# Patient Record
Sex: Female | Born: 1945 | Race: White | Hispanic: No | Marital: Married | Smoking: Never smoker
Health system: Southern US, Community
[De-identification: ages and names within clinical notes are randomized; demographics above are authoritative.]

## PROBLEM LIST (undated history)

## (undated) DIAGNOSIS — Z87442 Personal history of urinary calculi: Secondary | ICD-10-CM

## (undated) DIAGNOSIS — I429 Cardiomyopathy, unspecified: Secondary | ICD-10-CM

## (undated) DIAGNOSIS — Z923 Personal history of irradiation: Secondary | ICD-10-CM

## (undated) DIAGNOSIS — R002 Palpitations: Secondary | ICD-10-CM

## (undated) DIAGNOSIS — C50919 Malignant neoplasm of unspecified site of unspecified female breast: Secondary | ICD-10-CM

## (undated) DIAGNOSIS — R519 Headache, unspecified: Secondary | ICD-10-CM

## (undated) DIAGNOSIS — T4145XA Adverse effect of unspecified anesthetic, initial encounter: Secondary | ICD-10-CM

## (undated) DIAGNOSIS — I10 Essential (primary) hypertension: Secondary | ICD-10-CM

## (undated) DIAGNOSIS — H9190 Unspecified hearing loss, unspecified ear: Secondary | ICD-10-CM

## (undated) DIAGNOSIS — E785 Hyperlipidemia, unspecified: Secondary | ICD-10-CM

## (undated) DIAGNOSIS — K219 Gastro-esophageal reflux disease without esophagitis: Secondary | ICD-10-CM

## (undated) DIAGNOSIS — D649 Anemia, unspecified: Secondary | ICD-10-CM

## (undated) DIAGNOSIS — T8859XA Other complications of anesthesia, initial encounter: Secondary | ICD-10-CM

## (undated) DIAGNOSIS — C801 Malignant (primary) neoplasm, unspecified: Secondary | ICD-10-CM

## (undated) HISTORY — PX: COLONOSCOPY: SHX174

## (undated) HISTORY — PX: WRIST SURGERY: SHX841

## (undated) HISTORY — PX: ABDOMINAL HYSTERECTOMY: SHX81

## (undated) HISTORY — PX: BREAST LUMPECTOMY: SHX2

## (undated) HISTORY — PX: INNER EAR SURGERY: SHX679

## (undated) HISTORY — PX: ROTATOR CUFF REPAIR: SHX139

---
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
## 2008-12-26 ENCOUNTER — Ambulatory Visit (HOSPITAL_BASED_OUTPATIENT_CLINIC_OR_DEPARTMENT_OTHER): Admission: RE | Admit: 2008-12-26 | Discharge: 2008-12-27 | Payer: Self-pay | Admitting: Orthopedic Surgery

## 2010-06-06 LAB — BASIC METABOLIC PANEL
BUN: 12 mg/dL (ref 6–23)
Chloride: 106 mEq/L (ref 96–112)
Glucose, Bld: 94 mg/dL (ref 70–99)
Potassium: 4.1 mEq/L (ref 3.5–5.1)
Sodium: 143 mEq/L (ref 135–145)

## 2010-06-06 LAB — POCT HEMOGLOBIN-HEMACUE: Hemoglobin: 13.7 g/dL (ref 12.0–15.0)

## 2010-06-12 ENCOUNTER — Encounter (HOSPITAL_COMMUNITY)
Admission: RE | Admit: 2010-06-12 | Discharge: 2010-06-12 | Disposition: A | Discharge status: Home / Self Care | Payer: Medicare Other | Source: Ambulatory Visit | Attending: Obstetrics and Gynecology | Admitting: Obstetrics and Gynecology

## 2010-06-12 LAB — CBC
HCT: 40.8 % (ref 36.0–46.0)
Hemoglobin: 13.4 g/dL (ref 12.0–15.0)
MCH: 30.9 pg (ref 26.0–34.0)
MCHC: 32.8 g/dL (ref 30.0–36.0)
RBC: 4.34 MIL/uL (ref 3.87–5.11)

## 2010-06-20 ENCOUNTER — Ambulatory Visit (HOSPITAL_COMMUNITY)
Admission: RE | Admit: 2010-06-20 | Discharge: 2010-06-21 | Disposition: A | Discharge status: Home / Self Care | Payer: Medicare Other | Source: Ambulatory Visit | Attending: Obstetrics and Gynecology | Admitting: Obstetrics and Gynecology

## 2010-06-20 DIAGNOSIS — Z01818 Encounter for other preprocedural examination: Secondary | ICD-10-CM | POA: Insufficient documentation

## 2010-06-20 DIAGNOSIS — N815 Vaginal enterocele: Secondary | ICD-10-CM | POA: Insufficient documentation

## 2010-06-20 DIAGNOSIS — N993 Prolapse of vaginal vault after hysterectomy: Secondary | ICD-10-CM | POA: Insufficient documentation

## 2010-06-20 LAB — TYPE AND SCREEN: Antibody Screen: NEGATIVE

## 2010-06-20 LAB — ABO/RH: ABO/RH(D): O POS

## 2010-06-21 LAB — CBC
MCV: 94 fL (ref 78.0–100.0)
Platelets: 162 10*3/uL (ref 150–400)
RBC: 3.31 MIL/uL — ABNORMAL LOW (ref 3.87–5.11)
RDW: 12.8 % (ref 11.5–15.5)
WBC: 9.7 10*3/uL (ref 4.0–10.5)

## 2010-06-25 NOTE — Op Note (Signed)
NAMEALSACE, DOWD            ACCOUNT NO.:  [id number]  MEDICAL RECORD NO.:  [id number]           PATIENT TYPE:  LOCATION:                                 FACILITY:  PHYSICIAN:  Zelphia Cairo, MD    DATE OF BIRTH:  05/03/1945  DATE OF PROCEDURE:  06/20/2010 DATE OF DISCHARGE:                              OPERATIVE REPORT   PREOPERATIVE DIAGNOSIS:  Vaginal wall prolapse.  PROCEDURE: 1. Enterocele repair. 2. Anterior repair. 3. Posterior repair. 4. Sacral spinous ligament suspension. 5. Cystoscopy.  SURGEON:  Zelphia Cairo, MD  ASSISTANTHenderson Cloud.  BLOOD LOSS:  Minimal.  COMPLICATIONS:  None.  ANESTHESIA:  General.  CONDITION:  Stable and extubated to recovery room.  PROCEDURE:  Sundee was taken to the operating room after informed consent was obtained.  She was given general anesthesia and placed in the dorsal lithotomy position using Allen stirrups.  She was prepped and draped in sterile fashion and a Foley catheter was inserted sterilely. A weighted speculum was placed in the posterior vaginal vault and the grade 3 enterocele was noted.  The enterocele sac was grasped with 2 Allis clamps and mucosa was injected with local anesthesia, horizontal superficial incision was made and the enterocele sac was entered sharply.  The bowel was sharply dissected free of the enterocele sac and surrounding vaginal cuff.  The patient was placed in Trendelenburg position and the pelvic peritoneum was reapproximated using a pursestring of 2-0 Vicryl suture.  Enterocele sac was then resected and our attention was turned to the anterior vaginal wall.  The anterior vaginal wall was injected with one-to-one solution of dilute lidocaine with epinephrine.  The suburethral vaginal mucosa was grasped with 2 Allis clamps and a vertical incision in the vaginal mucosa was made using a scalpel.  The cut edges of the mucosa was grasped on each side with Allis clamps and the  pubocervical fascia and bladder were dissected off of the vaginal mucosa.  The bladder fascia then was imbricated using 2-0 Vicryl in interrupted sutures.  This was a small amount of the vaginal mucosa was then resected and the vaginal mucosal edges were reapproximated using a running locked stitch of 0 Vicryl.  Now our attention was turned to the posterior vaginal wall 2 Allis clamps were used to grasp the posterior fourchette and V-shaped wedge of the perineum and posterior fourchette was dissected using the scalpel. The midline of the vaginal mucosa was grasped with 2 Allis clamps and an incision was made up the vaginal vault.  Allis clamps were used to grasp the incision and the underlying tissue was dissected off of the vaginal mucosa with a dry sponge.  The rectum was dissected free of the vaginal mucosa bluntly and the right sacral spinous ligament was identified. The ischial spine could be palpated and the Capio needle was used to place a suture 2 fingerbreadths medial to the spine in the sacral spinous ligament.  This suture was also fixed to the vaginal cuff.  The posterior fascia was then reapproximated and imbricated.  Excess vaginal mucosa was excised and the vaginal mucosa was then reapproximated using 0 Vicryl in  a running locked fashion.  All retractors and were then removed from the abdomen and a cystoscopy was performed.  Bilateral ureteral jets were noted.  No sutures or other abnormalities were noted within the bladder.  Once cystoscopy was completed, the vagina was packed with gauze and Estrace cream.  The patient was extubated and taken to the recovery room in stable condition.     Zelphia Cairo, MD     GA/MEDQ  D:  06/22/2010  T:  06/22/2010  Job:  [id number]  Electronically Signed by Zelphia Cairo MD on 06/25/2010 01:39:23 PM

## 2011-04-24 DIAGNOSIS — J029 Acute pharyngitis, unspecified: Secondary | ICD-10-CM | POA: Diagnosis not present

## 2011-07-15 DIAGNOSIS — E785 Hyperlipidemia, unspecified: Secondary | ICD-10-CM | POA: Diagnosis not present

## 2011-12-01 DIAGNOSIS — J329 Chronic sinusitis, unspecified: Secondary | ICD-10-CM | POA: Diagnosis not present

## 2011-12-30 DIAGNOSIS — Z23 Encounter for immunization: Secondary | ICD-10-CM | POA: Diagnosis not present

## 2012-01-14 DIAGNOSIS — E785 Hyperlipidemia, unspecified: Secondary | ICD-10-CM | POA: Diagnosis not present

## 2012-01-14 DIAGNOSIS — Z833 Family history of diabetes mellitus: Secondary | ICD-10-CM | POA: Diagnosis not present

## 2012-01-14 DIAGNOSIS — I428 Other cardiomyopathies: Secondary | ICD-10-CM | POA: Diagnosis not present

## 2012-01-14 DIAGNOSIS — Z8349 Family history of other endocrine, nutritional and metabolic diseases: Secondary | ICD-10-CM | POA: Diagnosis not present

## 2012-03-01 DIAGNOSIS — R29890 Loss of height: Secondary | ICD-10-CM | POA: Diagnosis not present

## 2012-03-01 DIAGNOSIS — Z1231 Encounter for screening mammogram for malignant neoplasm of breast: Secondary | ICD-10-CM | POA: Diagnosis not present

## 2012-03-01 DIAGNOSIS — Z13820 Encounter for screening for osteoporosis: Secondary | ICD-10-CM | POA: Diagnosis not present

## 2012-03-01 DIAGNOSIS — Z1212 Encounter for screening for malignant neoplasm of rectum: Secondary | ICD-10-CM | POA: Diagnosis not present

## 2012-03-01 DIAGNOSIS — Z124 Encounter for screening for malignant neoplasm of cervix: Secondary | ICD-10-CM | POA: Diagnosis not present

## 2012-03-16 DIAGNOSIS — H2510 Age-related nuclear cataract, unspecified eye: Secondary | ICD-10-CM | POA: Diagnosis not present

## 2012-03-16 DIAGNOSIS — H524 Presbyopia: Secondary | ICD-10-CM | POA: Diagnosis not present

## 2012-08-19 DIAGNOSIS — E785 Hyperlipidemia, unspecified: Secondary | ICD-10-CM | POA: Diagnosis not present

## 2012-08-19 DIAGNOSIS — I428 Other cardiomyopathies: Secondary | ICD-10-CM | POA: Diagnosis not present

## 2012-09-06 DIAGNOSIS — Z23 Encounter for immunization: Secondary | ICD-10-CM | POA: Diagnosis not present

## 2012-09-06 DIAGNOSIS — E7800 Pure hypercholesterolemia, unspecified: Secondary | ICD-10-CM | POA: Diagnosis not present

## 2012-10-28 DIAGNOSIS — M75100 Unspecified rotator cuff tear or rupture of unspecified shoulder, not specified as traumatic: Secondary | ICD-10-CM | POA: Diagnosis not present

## 2012-12-21 DIAGNOSIS — M9981 Other biomechanical lesions of cervical region: Secondary | ICD-10-CM | POA: Diagnosis not present

## 2012-12-21 DIAGNOSIS — M999 Biomechanical lesion, unspecified: Secondary | ICD-10-CM | POA: Diagnosis not present

## 2012-12-21 DIAGNOSIS — M5412 Radiculopathy, cervical region: Secondary | ICD-10-CM | POA: Diagnosis not present

## 2012-12-23 DIAGNOSIS — M9981 Other biomechanical lesions of cervical region: Secondary | ICD-10-CM | POA: Diagnosis not present

## 2012-12-23 DIAGNOSIS — M5412 Radiculopathy, cervical region: Secondary | ICD-10-CM | POA: Diagnosis not present

## 2012-12-23 DIAGNOSIS — M999 Biomechanical lesion, unspecified: Secondary | ICD-10-CM | POA: Diagnosis not present

## 2012-12-28 DIAGNOSIS — M5412 Radiculopathy, cervical region: Secondary | ICD-10-CM | POA: Diagnosis not present

## 2012-12-28 DIAGNOSIS — M999 Biomechanical lesion, unspecified: Secondary | ICD-10-CM | POA: Diagnosis not present

## 2012-12-28 DIAGNOSIS — M9981 Other biomechanical lesions of cervical region: Secondary | ICD-10-CM | POA: Diagnosis not present

## 2013-01-07 DIAGNOSIS — M75100 Unspecified rotator cuff tear or rupture of unspecified shoulder, not specified as traumatic: Secondary | ICD-10-CM | POA: Diagnosis not present

## 2013-01-07 DIAGNOSIS — Z23 Encounter for immunization: Secondary | ICD-10-CM | POA: Diagnosis not present

## 2013-01-10 DIAGNOSIS — E7800 Pure hypercholesterolemia, unspecified: Secondary | ICD-10-CM | POA: Diagnosis not present

## 2013-01-10 DIAGNOSIS — M75100 Unspecified rotator cuff tear or rupture of unspecified shoulder, not specified as traumatic: Secondary | ICD-10-CM | POA: Diagnosis not present

## 2013-01-10 DIAGNOSIS — Z006 Encounter for examination for normal comparison and control in clinical research program: Secondary | ICD-10-CM | POA: Diagnosis not present

## 2013-01-10 DIAGNOSIS — I10 Essential (primary) hypertension: Secondary | ICD-10-CM | POA: Diagnosis not present

## 2013-01-14 DIAGNOSIS — M75120 Complete rotator cuff tear or rupture of unspecified shoulder, not specified as traumatic: Secondary | ICD-10-CM | POA: Diagnosis not present

## 2013-02-15 DIAGNOSIS — E785 Hyperlipidemia, unspecified: Secondary | ICD-10-CM | POA: Diagnosis not present

## 2013-02-15 DIAGNOSIS — I428 Other cardiomyopathies: Secondary | ICD-10-CM | POA: Diagnosis not present

## 2013-03-02 DIAGNOSIS — Z01419 Encounter for gynecological examination (general) (routine) without abnormal findings: Secondary | ICD-10-CM | POA: Diagnosis not present

## 2013-03-02 DIAGNOSIS — Z1231 Encounter for screening mammogram for malignant neoplasm of breast: Secondary | ICD-10-CM | POA: Diagnosis not present

## 2013-06-13 DIAGNOSIS — H2510 Age-related nuclear cataract, unspecified eye: Secondary | ICD-10-CM | POA: Diagnosis not present

## 2013-06-13 DIAGNOSIS — H5210 Myopia, unspecified eye: Secondary | ICD-10-CM | POA: Diagnosis not present

## 2013-09-20 DIAGNOSIS — E785 Hyperlipidemia, unspecified: Secondary | ICD-10-CM | POA: Diagnosis not present

## 2013-11-10 DIAGNOSIS — J209 Acute bronchitis, unspecified: Secondary | ICD-10-CM | POA: Diagnosis not present

## 2013-12-21 DIAGNOSIS — Z23 Encounter for immunization: Secondary | ICD-10-CM | POA: Diagnosis not present

## 2014-03-21 DIAGNOSIS — Z124 Encounter for screening for malignant neoplasm of cervix: Secondary | ICD-10-CM | POA: Diagnosis not present

## 2014-03-21 DIAGNOSIS — Z1231 Encounter for screening mammogram for malignant neoplasm of breast: Secondary | ICD-10-CM | POA: Diagnosis not present

## 2014-03-21 DIAGNOSIS — R29890 Loss of height: Secondary | ICD-10-CM | POA: Diagnosis not present

## 2014-06-13 DIAGNOSIS — E785 Hyperlipidemia, unspecified: Secondary | ICD-10-CM | POA: Diagnosis not present

## 2014-06-19 DIAGNOSIS — H2513 Age-related nuclear cataract, bilateral: Secondary | ICD-10-CM | POA: Diagnosis not present

## 2014-06-19 DIAGNOSIS — H40013 Open angle with borderline findings, low risk, bilateral: Secondary | ICD-10-CM | POA: Diagnosis not present

## 2014-06-22 DIAGNOSIS — I10 Essential (primary) hypertension: Secondary | ICD-10-CM | POA: Diagnosis not present

## 2014-10-12 DIAGNOSIS — N951 Menopausal and female climacteric states: Secondary | ICD-10-CM | POA: Diagnosis not present

## 2014-11-20 DIAGNOSIS — N951 Menopausal and female climacteric states: Secondary | ICD-10-CM | POA: Diagnosis not present

## 2014-12-19 DIAGNOSIS — Z23 Encounter for immunization: Secondary | ICD-10-CM | POA: Diagnosis not present

## 2014-12-28 DIAGNOSIS — I10 Essential (primary) hypertension: Secondary | ICD-10-CM

## 2014-12-28 DIAGNOSIS — I429 Cardiomyopathy, unspecified: Secondary | ICD-10-CM | POA: Diagnosis not present

## 2014-12-28 DIAGNOSIS — E782 Mixed hyperlipidemia: Secondary | ICD-10-CM | POA: Diagnosis not present

## 2014-12-28 DIAGNOSIS — Z6825 Body mass index (BMI) 25.0-25.9, adult: Secondary | ICD-10-CM | POA: Diagnosis not present

## 2014-12-28 HISTORY — DX: Essential (primary) hypertension: I10

## 2015-03-26 DIAGNOSIS — Z6825 Body mass index (BMI) 25.0-25.9, adult: Secondary | ICD-10-CM | POA: Diagnosis not present

## 2015-03-26 DIAGNOSIS — Z01419 Encounter for gynecological examination (general) (routine) without abnormal findings: Secondary | ICD-10-CM | POA: Diagnosis not present

## 2015-03-26 DIAGNOSIS — Z1231 Encounter for screening mammogram for malignant neoplasm of breast: Secondary | ICD-10-CM | POA: Diagnosis not present

## 2015-07-26 DIAGNOSIS — H40013 Open angle with borderline findings, low risk, bilateral: Secondary | ICD-10-CM | POA: Diagnosis not present

## 2015-07-26 DIAGNOSIS — H52223 Regular astigmatism, bilateral: Secondary | ICD-10-CM | POA: Diagnosis not present

## 2015-07-26 DIAGNOSIS — H2513 Age-related nuclear cataract, bilateral: Secondary | ICD-10-CM | POA: Diagnosis not present

## 2015-07-26 DIAGNOSIS — H524 Presbyopia: Secondary | ICD-10-CM | POA: Diagnosis not present

## 2015-09-13 DIAGNOSIS — E782 Mixed hyperlipidemia: Secondary | ICD-10-CM | POA: Diagnosis not present

## 2015-09-13 DIAGNOSIS — I429 Cardiomyopathy, unspecified: Secondary | ICD-10-CM | POA: Diagnosis not present

## 2015-09-13 DIAGNOSIS — I10 Essential (primary) hypertension: Secondary | ICD-10-CM | POA: Diagnosis not present

## 2015-09-13 DIAGNOSIS — Z6825 Body mass index (BMI) 25.0-25.9, adult: Secondary | ICD-10-CM | POA: Diagnosis not present

## 2015-09-18 DIAGNOSIS — I10 Essential (primary) hypertension: Secondary | ICD-10-CM | POA: Diagnosis not present

## 2015-09-18 DIAGNOSIS — I429 Cardiomyopathy, unspecified: Secondary | ICD-10-CM | POA: Diagnosis not present

## 2015-09-18 DIAGNOSIS — E782 Mixed hyperlipidemia: Secondary | ICD-10-CM | POA: Diagnosis not present

## 2015-12-25 DIAGNOSIS — Z23 Encounter for immunization: Secondary | ICD-10-CM | POA: Diagnosis not present

## 2016-03-26 DIAGNOSIS — Z6825 Body mass index (BMI) 25.0-25.9, adult: Secondary | ICD-10-CM | POA: Diagnosis not present

## 2016-03-26 DIAGNOSIS — Z01419 Encounter for gynecological examination (general) (routine) without abnormal findings: Secondary | ICD-10-CM | POA: Diagnosis not present

## 2016-03-26 DIAGNOSIS — N958 Other specified menopausal and perimenopausal disorders: Secondary | ICD-10-CM | POA: Diagnosis not present

## 2016-03-26 DIAGNOSIS — Z1231 Encounter for screening mammogram for malignant neoplasm of breast: Secondary | ICD-10-CM | POA: Diagnosis not present

## 2016-04-15 DIAGNOSIS — I2510 Atherosclerotic heart disease of native coronary artery without angina pectoris: Secondary | ICD-10-CM | POA: Diagnosis not present

## 2016-04-15 DIAGNOSIS — I428 Other cardiomyopathies: Secondary | ICD-10-CM | POA: Diagnosis not present

## 2016-04-15 DIAGNOSIS — I10 Essential (primary) hypertension: Secondary | ICD-10-CM | POA: Diagnosis not present

## 2016-04-15 DIAGNOSIS — E782 Mixed hyperlipidemia: Secondary | ICD-10-CM | POA: Diagnosis not present

## 2016-04-15 DIAGNOSIS — Z6825 Body mass index (BMI) 25.0-25.9, adult: Secondary | ICD-10-CM | POA: Diagnosis not present

## 2016-07-17 DIAGNOSIS — I10 Essential (primary) hypertension: Secondary | ICD-10-CM | POA: Diagnosis not present

## 2016-07-17 DIAGNOSIS — I2510 Atherosclerotic heart disease of native coronary artery without angina pectoris: Secondary | ICD-10-CM | POA: Diagnosis not present

## 2016-07-17 DIAGNOSIS — E782 Mixed hyperlipidemia: Secondary | ICD-10-CM | POA: Diagnosis not present

## 2016-07-17 DIAGNOSIS — I428 Other cardiomyopathies: Secondary | ICD-10-CM | POA: Diagnosis not present

## 2016-08-19 DIAGNOSIS — L709 Acne, unspecified: Secondary | ICD-10-CM | POA: Diagnosis not present

## 2016-08-19 DIAGNOSIS — Z9181 History of falling: Secondary | ICD-10-CM | POA: Diagnosis not present

## 2016-08-19 DIAGNOSIS — Z1389 Encounter for screening for other disorder: Secondary | ICD-10-CM | POA: Diagnosis not present

## 2016-08-19 DIAGNOSIS — Z0000 Encounter for general adult medical examination without abnormal findings: Secondary | ICD-10-CM | POA: Diagnosis not present

## 2016-10-29 DIAGNOSIS — H40013 Open angle with borderline findings, low risk, bilateral: Secondary | ICD-10-CM | POA: Diagnosis not present

## 2016-10-29 DIAGNOSIS — H2513 Age-related nuclear cataract, bilateral: Secondary | ICD-10-CM | POA: Diagnosis not present

## 2016-12-22 ENCOUNTER — Other Ambulatory Visit: Payer: Self-pay

## 2016-12-22 DIAGNOSIS — I2510 Atherosclerotic heart disease of native coronary artery without angina pectoris: Secondary | ICD-10-CM

## 2016-12-22 DIAGNOSIS — E785 Hyperlipidemia, unspecified: Secondary | ICD-10-CM

## 2016-12-22 HISTORY — DX: Hyperlipidemia, unspecified: E78.5

## 2016-12-22 HISTORY — DX: Atherosclerotic heart disease of native coronary artery without angina pectoris: I25.10

## 2016-12-25 DIAGNOSIS — Z23 Encounter for immunization: Secondary | ICD-10-CM | POA: Diagnosis not present

## 2016-12-30 ENCOUNTER — Ambulatory Visit (INDEPENDENT_AMBULATORY_CARE_PROVIDER_SITE_OTHER): Payer: Medicare Other | Admitting: Cardiology

## 2016-12-30 ENCOUNTER — Encounter: Payer: Self-pay | Admitting: Cardiology

## 2016-12-30 VITALS — BP 128/80 | HR 92 | Ht 62.0 in | Wt 136.0 lb

## 2016-12-30 DIAGNOSIS — I2510 Atherosclerotic heart disease of native coronary artery without angina pectoris: Secondary | ICD-10-CM

## 2016-12-30 DIAGNOSIS — I429 Cardiomyopathy, unspecified: Secondary | ICD-10-CM | POA: Diagnosis not present

## 2016-12-30 DIAGNOSIS — I10 Essential (primary) hypertension: Secondary | ICD-10-CM | POA: Diagnosis not present

## 2016-12-30 DIAGNOSIS — E782 Mixed hyperlipidemia: Secondary | ICD-10-CM

## 2016-12-30 NOTE — Patient Instructions (Signed)
Medication Instructions:  Your physician recommends that you continue on your current medications as directed. Please refer to the Current Medication list given to you today.   Labwork: Your physician recommends that you have: BMP, CBC, TSH, liver func, and lipid  Testing/Procedures: None  Follow-Up: Your physician recommends that you schedule a follow-up appointment in: 9 months   Any Other Special Instructions Will Be Listed Below (If Applicable).     If you need a refill on your cardiac medications before your next appointment, please call your pharmacy.   [place], RN, BSN

## 2016-12-30 NOTE — Progress Notes (Signed)
Cardiology Office Note:    Date:  12/30/2016   ID:  Andrea [name], DOB 29-Sep-1945, MRN [id number]  PCP:  Andrea Hipps, MD  Cardiologist:  Andrea Lindau, MD   Referring MD: No ref. provider found    ASSESSMENT:    1. Coronary artery disease involving native coronary artery of native heart without angina pectoris   2. Cardiomyopathy, unspecified type ([place])   3. Essential hypertension   4. Mixed hyperlipidemia    PLAN:    In order of problems listed above:  1. Secondary prevention stressed to the patient. Importance of compliance with diet and medications stressed and she vocalized understanding. 2. Her blood pressure stable. Diet was discussed with dyslipidemia.She will have blood work including lipids. She is fasting this morning. 3. She will be seen in follow-up appointment in 9-10 months or earlier if she has any concerns.   Medication Adjustments/Labs and Tests Ordered: Current medicines are reviewed at length with the patient today.  Concerns regarding medicines are outlined above.  No orders of the defined types were placed in this encounter.  No orders of the defined types were placed in this encounter.    Chief Complaint  Patient presents with  . Follow-up    Routine follow up      History of Present Illness:    Andrea [name] is a 71 y.o. female . The patient has history of cardiomyopathy and essential hypertension. She takes care of activities of daily living without any problems. No chest pain orthopnea or PND. At the time of my evaluation she is alert awake oriented and in no distress.  I have Taken care of her in my previous practice and she is here to get her care transferred and to be seen by me here.  History reviewed. No pertinent past medical history.  History reviewed. No pertinent surgical history.  Current Medications: Current Meds  Medication Sig  . Ascorbic Acid (VITAMIN C) 1000 MG tablet Take 1,000 mg by mouth.  Marland Kitchen aspirin EC  81 MG tablet Take 81 mg by mouth.  . beta carotene 10000 UNIT capsule Take by mouth.  . Calcium Carb-Cholecalciferol (CALCIUM-VITAMIN D) 500-200 MG-UNIT tablet Take by mouth.  . carvedilol (COREG) 6.25 MG tablet TAKE 1/2 TABLET BY MOUTH TWICE DAILY  . DOCOSAHEXAENOIC ACID PO Take 1,000 mg by mouth 2 (two) times daily.   Marland Kitchen estradiol (VIVELLE-DOT) 0.025 MG/24HR biweekly  . Multiple Vitamins-Minerals (OCUVITE PO) Take by mouth.  Marland Kitchen omeprazole (PRILOSEC) 40 MG capsule Take 40 mg by mouth.  . Probiotic CAPS Take by mouth.  . vitamin B-12 (CYANOCOBALAMIN) 1000 MCG tablet Take by mouth.  . zinc gluconate 50 MG tablet Take 50 mg by mouth.     Allergies:   Cortisone   Social History   Social History  . Marital status: Divorced    Spouse name: N/A  . Number of children: N/A  . Years of education: N/A   Social History Main Topics  . Smoking status: Never Smoker  . Smokeless tobacco: Never Used  . Alcohol use No  . Drug use: No  . Sexual activity: Not Asked   Other Topics Concern  . None   Social History Narrative  . None     Family History: The patient's family history includes Valvular heart disease in her brother.  ROS:   Please see the history of present illness.    All other systems reviewed and are negative.  EKGs/Labs/Other Studies Reviewed:  The following studies were reviewed today: I discussed my findings with the patient at extensive length. Her vital signs are fine and her exercise protocol and exercise tolerance is good.   Recent Labs: No results found for requested labs within last 8760 hours.  Recent Lipid Panel No results found for: CHOL, TRIG, HDL, CHOLHDL, VLDL, LDLCALC, LDLDIRECT  Physical Exam:    VS:  BP 128/80   Pulse 92   Ht 5\' 2"  (1.575 m)   Wt 136 lb 0.6 oz (61.7 kg)   SpO2 97%   BMI 24.88 kg/m     Wt Readings from Last 3 Encounters:  12/30/16 136 lb 0.6 oz (61.7 kg)     GEN: Patient is in no acute distress HEENT: Normal NECK: No  JVD; No carotid bruits LYMPHATICS: No lymphadenopathy CARDIAC: Hear sounds regular, 2/6 systolic murmur at the apex. RESPIRATORY:  Clear to auscultation without rales, wheezing or rhonchi  ABDOMEN: Soft, non-tender, non-distended MUSCULOSKELETAL:  No edema; No deformity  SKIN: Warm and dry NEUROLOGIC:  Alert and oriented x 3 PSYCHIATRIC:  Normal affect   Signed, Andrea Lindau, MD  12/30/2016 9:10 AM    [place]

## 2016-12-30 NOTE — Addendum Note (Signed)
Addended by: Mattie Marlin on: 12/30/2016 09:16 AM   Modules accepted: Orders

## 2016-12-31 LAB — BASIC METABOLIC PANEL
BUN/Creatinine Ratio: 14 (ref 12–28)
BUN: 11 mg/dL (ref 8–27)
CO2: 28 mmol/L (ref 20–29)
Calcium: 9.7 mg/dL (ref 8.7–10.3)
Chloride: 100 mmol/L (ref 96–106)
Creatinine, Ser: 0.81 mg/dL (ref 0.57–1.00)
GFR calc Af Amer: 83 mL/min/{1.73_m2} (ref >59)
GFR calc non Af Amer: 72 mL/min/{1.73_m2} (ref >59)
GLUCOSE: 105 mg/dL — AB (ref 65–99)
POTASSIUM: 4.3 mmol/L (ref 3.5–5.2)
SODIUM: 142 mmol/L (ref 134–144)

## 2016-12-31 LAB — HEPATIC FUNCTION PANEL
ALBUMIN: 4.4 g/dL (ref 3.5–4.8)
ALT: 13 IU/L (ref 0–32)
AST: 19 IU/L (ref 0–40)
Alkaline Phosphatase: 59 IU/L (ref 39–117)
Bilirubin Total: 0.4 mg/dL (ref 0.0–1.2)
Bilirubin, Direct: 0.13 mg/dL (ref 0.00–0.40)
TOTAL PROTEIN: 7.4 g/dL (ref 6.0–8.5)

## 2016-12-31 LAB — LIPID PANEL
CHOL/HDL RATIO: 2.5 ratio (ref 0.0–4.4)
Cholesterol, Total: 241 mg/dL — ABNORMAL HIGH (ref 100–199)
HDL: 98 mg/dL (ref >39)
LDL CALC: 126 mg/dL — AB (ref 0–99)
TRIGLYCERIDES: 84 mg/dL (ref 0–149)
VLDL CHOLESTEROL CAL: 17 mg/dL (ref 5–40)

## 2016-12-31 LAB — CBC WITH DIFFERENTIAL/PLATELET
BASOS: 0 %
Basophils Absolute: 0 10*3/uL (ref 0.0–0.2)
EOS (ABSOLUTE): 0.1 10*3/uL (ref 0.0–0.4)
Eos: 1 %
Hematocrit: 44.1 % (ref 34.0–46.6)
Hemoglobin: 14.6 g/dL (ref 11.1–15.9)
IMMATURE GRANULOCYTES: 0 %
Immature Grans (Abs): 0 10*3/uL (ref 0.0–0.1)
Lymphocytes Absolute: 1.2 10*3/uL (ref 0.7–3.1)
Lymphs: 19 %
MCH: 31.1 pg (ref 26.6–33.0)
MCHC: 33.1 g/dL (ref 31.5–35.7)
MCV: 94 fL (ref 79–97)
MONOS ABS: 0.5 10*3/uL (ref 0.1–0.9)
Monocytes: 8 %
NEUTROS PCT: 72 %
Neutrophils Absolute: 4.6 10*3/uL (ref 1.4–7.0)
PLATELETS: 243 10*3/uL (ref 150–379)
RBC: 4.69 x10E6/uL (ref 3.77–5.28)
RDW: 13.4 % (ref 12.3–15.4)
WBC: 6.3 10*3/uL (ref 3.4–10.8)

## 2016-12-31 LAB — TSH: TSH: 0.779 u[IU]/mL (ref 0.450–4.500)

## 2017-01-01 ENCOUNTER — Telehealth: Payer: Self-pay | Admitting: Cardiology

## 2017-01-01 NOTE — Telephone Encounter (Signed)
Left voicemail for the patient to call the office again.

## 2017-01-01 NOTE — Telephone Encounter (Signed)
Returning call about labs

## 2017-01-06 ENCOUNTER — Telehealth: Payer: Self-pay

## 2017-01-06 NOTE — Telephone Encounter (Signed)
Pt returned phone call about lab results. Pt informed and verbalized understanding.

## 2017-03-11 ENCOUNTER — Other Ambulatory Visit: Payer: Self-pay

## 2017-03-11 MED ORDER — CARVEDILOL 6.25 MG PO TABS: 3.1250 mg | ORAL_TABLET | Freq: Two times a day (BID) | ORAL | 1 refills | Status: DC

## 2017-03-27 DIAGNOSIS — Z6825 Body mass index (BMI) 25.0-25.9, adult: Secondary | ICD-10-CM | POA: Diagnosis not present

## 2017-03-27 DIAGNOSIS — Z1231 Encounter for screening mammogram for malignant neoplasm of breast: Secondary | ICD-10-CM | POA: Diagnosis not present

## 2017-03-27 DIAGNOSIS — Z124 Encounter for screening for malignant neoplasm of cervix: Secondary | ICD-10-CM | POA: Diagnosis not present

## 2017-08-24 ENCOUNTER — Other Ambulatory Visit: Payer: Self-pay

## 2017-08-24 MED ORDER — CARVEDILOL 6.25 MG PO TABS: 3.1250 mg | ORAL_TABLET | Freq: Two times a day (BID) | ORAL | 0 refills | Status: DC

## 2017-08-28 ENCOUNTER — Other Ambulatory Visit: Payer: Self-pay | Admitting: Cardiology

## 2017-10-27 ENCOUNTER — Ambulatory Visit (INDEPENDENT_AMBULATORY_CARE_PROVIDER_SITE_OTHER): Payer: Medicare Other | Admitting: Cardiology

## 2017-10-27 ENCOUNTER — Encounter: Payer: Self-pay | Admitting: Cardiology

## 2017-10-27 VITALS — BP 122/68 | HR 104 | Ht 62.0 in | Wt 136.0 lb

## 2017-10-27 DIAGNOSIS — R0989 Other specified symptoms and signs involving the circulatory and respiratory systems: Secondary | ICD-10-CM | POA: Insufficient documentation

## 2017-10-27 DIAGNOSIS — R002 Palpitations: Secondary | ICD-10-CM

## 2017-10-27 DIAGNOSIS — E782 Mixed hyperlipidemia: Secondary | ICD-10-CM | POA: Diagnosis not present

## 2017-10-27 DIAGNOSIS — Z8679 Personal history of other diseases of the circulatory system: Secondary | ICD-10-CM | POA: Diagnosis not present

## 2017-10-27 DIAGNOSIS — I2510 Atherosclerotic heart disease of native coronary artery without angina pectoris: Secondary | ICD-10-CM

## 2017-10-27 DIAGNOSIS — I10 Essential (primary) hypertension: Secondary | ICD-10-CM | POA: Diagnosis not present

## 2017-10-27 HISTORY — DX: Personal history of other diseases of the circulatory system: Z86.79

## 2017-10-27 HISTORY — DX: Other specified symptoms and signs involving the circulatory and respiratory systems: R09.89

## 2017-10-27 LAB — HEPATIC FUNCTION PANEL
ALT: 15 IU/L (ref 0–32)
AST: 16 IU/L (ref 0–40)
Albumin: 4.3 g/dL (ref 3.5–4.8)
Alkaline Phosphatase: 61 IU/L (ref 39–117)
Bilirubin Total: 0.4 mg/dL (ref 0.0–1.2)
Bilirubin, Direct: 0.13 mg/dL (ref 0.00–0.40)
Total Protein: 7 g/dL (ref 6.0–8.5)

## 2017-10-27 LAB — CBC WITH DIFFERENTIAL/PLATELET
BASOS ABS: 0 10*3/uL (ref 0.0–0.2)
Basos: 0 %
EOS (ABSOLUTE): 0.1 10*3/uL (ref 0.0–0.4)
Eos: 3 %
Hematocrit: 40.7 % (ref 34.0–46.6)
Hemoglobin: 14.1 g/dL (ref 11.1–15.9)
Immature Grans (Abs): 0 10*3/uL (ref 0.0–0.1)
Immature Granulocytes: 0 %
LYMPHS ABS: 1.3 10*3/uL (ref 0.7–3.1)
Lymphs: 25 %
MCH: 32 pg (ref 26.6–33.0)
MCHC: 34.6 g/dL (ref 31.5–35.7)
MCV: 92 fL (ref 79–97)
MONOCYTES: 8 %
MONOS ABS: 0.4 10*3/uL (ref 0.1–0.9)
Neutrophils Absolute: 3.3 10*3/uL (ref 1.4–7.0)
Neutrophils: 64 %
PLATELETS: 255 10*3/uL (ref 150–450)
RBC: 4.41 x10E6/uL (ref 3.77–5.28)
RDW: 13.5 % (ref 12.3–15.4)
WBC: 5.2 10*3/uL (ref 3.4–10.8)

## 2017-10-27 LAB — BASIC METABOLIC PANEL
BUN/Creatinine Ratio: 17 (ref 12–28)
BUN: 14 mg/dL (ref 8–27)
CHLORIDE: 100 mmol/L (ref 96–106)
CO2: 26 mmol/L (ref 20–29)
Calcium: 10.1 mg/dL (ref 8.7–10.3)
Creatinine, Ser: 0.81 mg/dL (ref 0.57–1.00)
GFR calc Af Amer: 83 mL/min/{1.73_m2} (ref >59)
GFR calc non Af Amer: 72 mL/min/{1.73_m2} (ref >59)
GLUCOSE: 104 mg/dL — AB (ref 65–99)
Potassium: 4.7 mmol/L (ref 3.5–5.2)
SODIUM: 142 mmol/L (ref 134–144)

## 2017-10-27 LAB — LIPID PANEL
CHOLESTEROL TOTAL: 250 mg/dL — AB (ref 100–199)
Chol/HDL Ratio: 2.5 ratio (ref 0.0–4.4)
HDL: 101 mg/dL (ref >39)
LDL CALC: 131 mg/dL — AB (ref 0–99)
TRIGLYCERIDES: 88 mg/dL (ref 0–149)
VLDL CHOLESTEROL CAL: 18 mg/dL (ref 5–40)

## 2017-10-27 LAB — TSH: TSH: 0.733 u[IU]/mL (ref 0.450–4.500)

## 2017-10-27 NOTE — Addendum Note (Signed)
Addended by: Mattie Marlin on: 10/27/2017 08:57 AM   Modules accepted: Orders

## 2017-10-27 NOTE — Progress Notes (Signed)
Cardiology Office Note:    Date:  10/27/2017   ID:  SUMAIYAH MARKERT, DOB 12-29-1945, MRN [id number]  PCP:  Ronita Hipps, MD  Cardiologist:  Jenean Lindau, MD   Referring MD: Ronita Hipps, MD    ASSESSMENT:    1. Essential hypertension   2. Mixed hyperlipidemia   3. History of cardiomyopathy    PLAN:    In order of problems listed above:  1. Secondary prevention stressed with the patient.  Importance of compliance with diet and medication stressed and she vocalized understanding.  Her blood pressure is stable.  She has a very good exercise program.  Her blood pressure is stable.  We will 2. We will check her lipids today along with the blood work 3. Echocardiogram will be done to assess murmur heard on auscultation 4. She will have bilateral carotid evaluation for bruit 5. We will do 48-hour Holter monitoring to assess palpitations. 6. Patient will be seen in follow-up appointment in 6 months or earlier if the patient has any concerns    Medication Adjustments/Labs and Tests Ordered: Current medicines are reviewed at length with the patient today.  Concerns regarding medicines are outlined above.  No orders of the defined types were placed in this encounter.  No orders of the defined types were placed in this encounter.    No chief complaint on file.    History of Present Illness:    ELLSIE VIOLETTE is a 72 y.o. female.  Patient has past medical history of cardiomyopathy.  She denies any problems at this time and takes care of activities of daily living.  No chest pain orthopnea or PND.  She mentions to me that she walks on a regular basis.  She feels her heartbeat is faster at times and may be palpitations.  No orthopnea or PND.  At the time of my evaluation, the patient is alert awake oriented and in no distress.  History reviewed. No pertinent past medical history.  Past Surgical History:  Procedure Laterality Date  . ABDOMINAL HYSTERECTOMY    . INNER  EAR SURGERY    . WRIST SURGERY      Current Medications: Current Meds  Medication Sig  . Ascorbic Acid (VITAMIN C) 1000 MG tablet Take 1,000 mg by mouth daily.   Marland Kitchen aspirin EC 81 MG tablet Take 81 mg by mouth daily.   . beta carotene 10000 UNIT capsule Take 10,000 Units by mouth daily.   . Calcium Carb-Cholecalciferol (CALCIUM-VITAMIN D) 500-200 MG-UNIT tablet Take 1 tablet by mouth daily.   . carvedilol (COREG) 6.25 MG tablet TAKE 1/2 TABLET BY MOUTH BY MOUTH TWICE DAILY  . DOCOSAHEXAENOIC ACID PO Take 1,000 mg by mouth 2 (two) times daily.   Marland Kitchen estradiol (VIVELLE-DOT) 0.025 MG/24HR Place 1 patch onto the skin once a week.   . Multiple Vitamins-Minerals (OCUVITE PO) Take 1 tablet by mouth daily.   Marland Kitchen omeprazole (PRILOSEC) 40 MG capsule Take 40 mg by mouth daily.   . Probiotic CAPS Take 1 capsule by mouth daily.   . vitamin B-12 (CYANOCOBALAMIN) 1000 MCG tablet Take 1,000 mcg by mouth daily.   Marland Kitchen zinc gluconate 50 MG tablet Take 50 mg by mouth daily.      Allergies:   Cortisone   Social History   Socioeconomic History  . Marital status: Divorced    Spouse name: Not on file  . Number of children: Not on file  . Years of education: Not on file  .  Highest education level: Not on file  Occupational History  . Not on file  Social Needs  . Financial resource strain: Not on file  . Food insecurity:    Worry: Not on file    Inability: Not on file  . Transportation needs:    Medical: Not on file    Non-medical: Not on file  Tobacco Use  . Smoking status: Never Smoker  . Smokeless tobacco: Never Used  Substance and Sexual Activity  . Alcohol use: No  . Drug use: No  . Sexual activity: Not on file  Lifestyle  . Physical activity:    Days per week: Not on file    Minutes per session: Not on file  . Stress: Not on file  Relationships  . Social connections:    Talks on phone: Not on file    Gets together: Not on file    Attends religious service: Not on file    Active member  of club or organization: Not on file    Attends meetings of clubs or organizations: Not on file    Relationship status: Not on file  Other Topics Concern  . Not on file  Social History Narrative  . Not on file     Family History: The patient's family history includes Valvular heart disease in her brother.  ROS:   Please see the history of present illness.    All other systems reviewed and are negative.  EKGs/Labs/Other Studies Reviewed:    The following studies were reviewed today: I discussed my findings with the patient at extensive length   Recent Labs: 12/30/2016: ALT 13; BUN 11; Creatinine, Ser 0.81; Hemoglobin 14.6; Platelets 243; Potassium 4.3; Sodium 142; TSH 0.779  Recent Lipid Panel    Component Value Date/Time   CHOL 241 (H) 12/30/2016 0921   TRIG 84 12/30/2016 0921   HDL 98 12/30/2016 0921   CHOLHDL 2.5 12/30/2016 0921   LDLCALC 126 (H) 12/30/2016 0921    Physical Exam:    VS:  BP 122/68 (BP Location: Right Arm, Patient Position: Sitting, Cuff Size: Normal)   Pulse (!) 104   Ht 5\' 2"  (1.575 m)   Wt 136 lb (61.7 kg)   SpO2 97%   BMI 24.87 kg/m     Wt Readings from Last 3 Encounters:  10/27/17 136 lb (61.7 kg)  12/30/16 136 lb 0.6 oz (61.7 kg)     GEN: Patient is in no acute distress HEENT: Normal NECK: No JVD; bilateral soft carotid bruits LYMPHATICS: No lymphadenopathy CARDIAC: Hear sounds regular, 2/6 systolic murmur at the apex. RESPIRATORY:  Clear to auscultation without rales, wheezing or rhonchi  ABDOMEN: Soft, non-tender, non-distended MUSCULOSKELETAL:  No edema; No deformity  SKIN: Warm and dry NEUROLOGIC:  Alert and oriented x 3 PSYCHIATRIC:  Normal affect   Signed, Jenean Lindau, MD  10/27/2017 8:45 AM    [place]

## 2017-10-27 NOTE — Patient Instructions (Signed)
Medication Instructions:  Your physician recommends that you continue on your current medications as directed. Please refer to the Current Medication list given to you today.  Labwork: Your physician recommends that you have the following labs drawn: BMP, CBC, TSH, liver and lipid panel.  Testing/Procedures: Your physician has requested that you have an echocardiogram. Echocardiography is a painless test that uses sound waves to create images of your heart. It provides your doctor with information about the size and shape of your heart and how well your heart's chambers and valves are working. This procedure takes approximately one hour. There are no restrictions for this procedure.  Your physician has recommended that you wear a holter monitor. Holter monitors are medical devices that record the heart's electrical activity. Doctors most often use these monitors to diagnose arrhythmias. Arrhythmias are problems with the speed or rhythm of the heartbeat. The monitor is a small, portable device. You can wear one while you do your normal daily activities. This is usually used to diagnose what is causing palpitations/syncope (passing out).  Your physician has requested that you have a carotid duplex. This test is an ultrasound of the carotid arteries in your neck. It looks at blood flow through these arteries that supply the brain with blood. Allow one hour for this exam. There are no restrictions or special instructions.  Follow-Up: Your physician recommends that you schedule a follow-up appointment in: 6 months  Any Other Special Instructions Will Be Listed Below (If Applicable).     If you need a refill on your cardiac medications before your next appointment, please call your pharmacy.   [place], RN, BSN

## 2017-10-29 ENCOUNTER — Telehealth: Payer: Self-pay | Admitting: Cardiology

## 2017-10-29 NOTE — Telephone Encounter (Signed)
Left message for pt to return call if desired regarding results.

## 2017-10-29 NOTE — Progress Notes (Signed)
Left message for pt and results sent to PCP.

## 2017-10-29 NOTE — Telephone Encounter (Signed)
Someone called her about her labs but she couldn't understand them

## 2017-11-04 DIAGNOSIS — H40013 Open angle with borderline findings, low risk, bilateral: Secondary | ICD-10-CM | POA: Diagnosis not present

## 2017-11-04 DIAGNOSIS — H2513 Age-related nuclear cataract, bilateral: Secondary | ICD-10-CM | POA: Diagnosis not present

## 2017-11-04 DIAGNOSIS — H5213 Myopia, bilateral: Secondary | ICD-10-CM | POA: Diagnosis not present

## 2017-11-04 DIAGNOSIS — H52223 Regular astigmatism, bilateral: Secondary | ICD-10-CM | POA: Diagnosis not present

## 2017-12-16 ENCOUNTER — Ambulatory Visit (INDEPENDENT_AMBULATORY_CARE_PROVIDER_SITE_OTHER): Payer: Medicare Other

## 2017-12-16 ENCOUNTER — Other Ambulatory Visit: Payer: Self-pay

## 2017-12-16 ENCOUNTER — Telehealth: Payer: Self-pay

## 2017-12-16 DIAGNOSIS — Z8679 Personal history of other diseases of the circulatory system: Secondary | ICD-10-CM | POA: Diagnosis not present

## 2017-12-16 DIAGNOSIS — R002 Palpitations: Secondary | ICD-10-CM

## 2017-12-16 DIAGNOSIS — I428 Other cardiomyopathies: Secondary | ICD-10-CM

## 2017-12-16 DIAGNOSIS — R0989 Other specified symptoms and signs involving the circulatory and respiratory systems: Secondary | ICD-10-CM | POA: Diagnosis not present

## 2017-12-16 NOTE — Telephone Encounter (Signed)
-----   Message from Richardo Priest, MD sent at 12/16/2017  1:33 PM EDT ----- Normal  result

## 2017-12-16 NOTE — Telephone Encounter (Signed)
Called patient and left detailed voice message on phone regarding test results. 

## 2017-12-16 NOTE — Progress Notes (Signed)
Complete echocardiogram has been performed.  Jimmy Celinda Dethlefs RDCS, RVT 

## 2017-12-16 NOTE — Progress Notes (Signed)
Carotid artery duplex exam has been performed.   Jimmy Analei Whinery RDCS, RVT

## 2017-12-29 DIAGNOSIS — Z23 Encounter for immunization: Secondary | ICD-10-CM | POA: Diagnosis not present

## 2018-04-27 ENCOUNTER — Ambulatory Visit (INDEPENDENT_AMBULATORY_CARE_PROVIDER_SITE_OTHER): Payer: Medicare Other | Admitting: Cardiology

## 2018-04-27 ENCOUNTER — Other Ambulatory Visit: Payer: Self-pay | Admitting: Cardiology

## 2018-04-27 ENCOUNTER — Encounter: Payer: Self-pay | Admitting: Cardiology

## 2018-04-27 VITALS — BP 150/80 | HR 84 | Ht 62.0 in | Wt 137.8 lb

## 2018-04-27 DIAGNOSIS — Z8679 Personal history of other diseases of the circulatory system: Secondary | ICD-10-CM

## 2018-04-27 DIAGNOSIS — I10 Essential (primary) hypertension: Secondary | ICD-10-CM

## 2018-04-27 DIAGNOSIS — I2510 Atherosclerotic heart disease of native coronary artery without angina pectoris: Secondary | ICD-10-CM | POA: Diagnosis not present

## 2018-04-27 DIAGNOSIS — E782 Mixed hyperlipidemia: Secondary | ICD-10-CM | POA: Diagnosis not present

## 2018-04-27 LAB — BASIC METABOLIC PANEL
BUN / CREAT RATIO: 16 (ref 12–28)
BUN: 14 mg/dL (ref 8–27)
CHLORIDE: 100 mmol/L (ref 96–106)
CO2: 27 mmol/L (ref 20–29)
CREATININE: 0.87 mg/dL (ref 0.57–1.00)
Calcium: 9.7 mg/dL (ref 8.7–10.3)
GFR calc Af Amer: 76 mL/min/{1.73_m2} (ref >59)
GFR calc non Af Amer: 66 mL/min/{1.73_m2} (ref >59)
Glucose: 100 mg/dL — ABNORMAL HIGH (ref 65–99)
POTASSIUM: 4.6 mmol/L (ref 3.5–5.2)
SODIUM: 140 mmol/L (ref 134–144)

## 2018-04-27 LAB — CBC
Hematocrit: 43 % (ref 34.0–46.6)
Hemoglobin: 13.9 g/dL (ref 11.1–15.9)
MCH: 30.5 pg (ref 26.6–33.0)
MCHC: 32.3 g/dL (ref 31.5–35.7)
MCV: 95 fL (ref 79–97)
PLATELETS: 251 10*3/uL (ref 150–450)
RBC: 4.55 x10E6/uL (ref 3.77–5.28)
RDW: 12.8 % (ref 11.7–15.4)
WBC: 3.9 10*3/uL (ref 3.4–10.8)

## 2018-04-27 LAB — HEPATIC FUNCTION PANEL
ALT: 17 IU/L (ref 0–32)
AST: 21 IU/L (ref 0–40)
Albumin: 4.3 g/dL (ref 3.7–4.7)
Alkaline Phosphatase: 61 IU/L (ref 39–117)
Bilirubin Total: 0.4 mg/dL (ref 0.0–1.2)
Bilirubin, Direct: 0.13 mg/dL (ref 0.00–0.40)
Total Protein: 7.1 g/dL (ref 6.0–8.5)

## 2018-04-27 LAB — TSH: TSH: 0.872 u[IU]/mL (ref 0.450–4.500)

## 2018-04-27 LAB — LIPID PANEL
CHOLESTEROL TOTAL: 260 mg/dL — AB (ref 100–199)
Chol/HDL Ratio: 2.5 ratio (ref 0.0–4.4)
HDL: 104 mg/dL (ref >39)
LDL CALC: 139 mg/dL — AB (ref 0–99)
Triglycerides: 86 mg/dL (ref 0–149)
VLDL Cholesterol Cal: 17 mg/dL (ref 5–40)

## 2018-04-27 NOTE — Patient Instructions (Signed)
Medication Instructions:   Your physician recommends that you continue on your current medications as directed. Please refer to the Current Medication list given to you today.   If you need a refill on your cardiac medications before your next appointment, please call your pharmacy.   Lab work:  Your physician recommends that you return for lab work today: BMP, CBC, TSH, LIPID, LFT.   If you have labs (blood work) drawn today and your tests are completely normal, you will receive your results only by: Marland Kitchen MyChart Message (if you have MyChart) OR . A paper copy in the mail If you have any lab test that is abnormal or we need to change your treatment, we will call you to review the results.  Testing/Procedures:  NONE  Follow-Up: At [facility], you and your health needs are our priority.  As part of our continuing mission to provide you with exceptional heart care, we have created designated Provider Care Teams.  These Care Teams include your primary Cardiologist (physician) and Advanced Practice Providers (APPs -  Physician Assistants and Nurse Practitioners) who all work together to provide you with the care you need, when you need it.   You will need a follow up appointment in 6 months.  Please call our office 2 months in advance to schedule this appointment.

## 2018-04-27 NOTE — Progress Notes (Signed)
Cardiology Office Note:    Date:  04/27/2018   ID:  Andrea [name], DOB 07/16/1945, MRN [id number]  PCP:  Ronita Hipps, MD  Cardiologist:  Jenean Lindau, MD   Referring MD: Ronita Hipps, MD    ASSESSMENT:    1. 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 Essential hypertension   3. Mixed hyperlipidemia   4. History of cardiomyopathy    PLAN:    In order of problems listed above:  1. Secondary prevention stressed with the patient.  Importance of compliance with diet and medication stressed and she vocalized understanding.  Her blood pressure is stable.  Diet was discussed for dyslipidemia.  Importance of exercise stressed and she has a good program at this time. 2. She is fasting and will have blood work today including fasting lipids 3. Patient will be seen in follow-up appointment in 6 months or earlier if the patient has any concerns    Medication Adjustments/Labs and Tests Ordered: Current medicines are reviewed at length with the patient today.  Concerns regarding medicines are outlined above.  No orders of the defined types were placed in this encounter.  No orders of the defined types were placed in this encounter.    Chief Complaint  Patient presents with  . Follow-up     History of Present Illness:    Andrea [name] is a 73 y.o. female.  Patient has remote history of nonobstructive coronary artery disease and cardiomyopathy with reversal to normal left ventricular systolic function.  She has history of essential hypertension.  She denies any problems at this time care of activities of daily living.  She lives a very active lifestyle.  No chest pain orthopnea PND.  She is here for routine follow-up.  At the time of my evaluation, the patient is alert awake oriented and in no distress.  No past medical history on file.  Past Surgical History:  Procedure  Laterality Date  . ABDOMINAL HYSTERECTOMY    . INNER EAR SURGERY    . WRIST SURGERY      Current Medications: Current Meds  Medication Sig  . Ascorbic Acid (VITAMIN C) 1000 MG tablet Take 1,000 mg by mouth daily.   Marland Kitchen aspirin EC 81 MG tablet Take 81 mg by mouth daily.   . beta carotene 10000 UNIT capsule Take 10,000 Units by mouth daily.   . Calcium Carb-Cholecalciferol (CALCIUM-VITAMIN D) 500-200 MG-UNIT tablet Take 1 tablet by mouth daily.   . carvedilol (COREG) 6.25 MG tablet TAKE 1/2 TABLET BY MOUTH BY MOUTH TWICE DAILY  . DOCOSAHEXAENOIC ACID PO Take 1,000 mg by mouth 2 (two) times daily.   Marland Kitchen estradiol (VIVELLE-DOT) 0.025 MG/24HR Place 1 patch onto the skin once a week.   . Multiple Vitamins-Minerals (OCUVITE PO) Take 1 tablet by mouth daily.   Marland Kitchen omeprazole (PRILOSEC) 40 MG capsule Take 40 mg by mouth daily.   . Probiotic CAPS Take 1 capsule by mouth daily.   . vitamin B-12 (CYANOCOBALAMIN) 1000 MCG tablet Take 1,000 mcg by mouth daily.   Marland Kitchen zinc gluconate 50 MG tablet Take 50 mg by mouth daily.      Allergies:   Cortisone   Social History   Socioeconomic History  . Marital status: Divorced    Spouse name: Not on file  . Number of children: Not on file  . Years of education: Not on file  . Highest education  level: Not on file  Occupational History  . Not on file  Social Needs  . Financial resource strain: Not on file  . Food insecurity:    Worry: Not on file    Inability: Not on file  . Transportation needs:    Medical: Not on file    Non-medical: Not on file  Tobacco Use  . Smoking status: Never Smoker  . Smokeless tobacco: Never Used  Substance and Sexual Activity  . Alcohol use: No  . Drug use: No  . Sexual activity: Not on file  Lifestyle  . Physical activity:    Days per week: Not on file    Minutes per session: Not on file  . Stress: Not on file  Relationships  . Social connections:    Talks on phone: Not on file    Gets together: Not on file     Attends religious service: Not on file    Active member of club or organization: Not on file    Attends meetings of clubs or organizations: Not on file    Relationship status: Not on file  Other Topics Concern  . Not on file  Social History Narrative  . Not on file     Family History: The patient's family history includes Valvular heart disease in her brother.  ROS:   Please see the history of present illness.    All other systems reviewed and are negative.  EKGs/Labs/Other Studies Reviewed:    The following studies were reviewed today: I discussed my findings with the patient at extensive length.   Recent Labs: 10/27/2017: ALT 15; BUN 14; Creatinine, Ser 0.81; Hemoglobin 14.1; Platelets 255; Potassium 4.7; Sodium 142; TSH 0.733  Recent Lipid Panel    Component Value Date/Time   CHOL 250 (H) 10/27/2017 0901   TRIG 88 10/27/2017 0901   HDL 101 10/27/2017 0901   CHOLHDL 2.5 10/27/2017 0901   LDLCALC 131 (H) 10/27/2017 0901    Physical Exam:    VS:  BP (!) 150/80   Pulse 84   Ht 5\' 2"  (1.575 m)   Wt 137 lb 12.8 oz (62.5 kg)   SpO2 97%   BMI 25.20 kg/m     Wt Readings from Last 3 Encounters:  04/27/18 137 lb 12.8 oz (62.5 kg)  10/27/17 136 lb (61.7 kg)  12/30/16 136 lb 0.6 oz (61.7 kg)     GEN: Patient is in no acute distress HEENT: Normal NECK: No JVD; No carotid bruits LYMPHATICS: No lymphadenopathy CARDIAC: Hear sounds regular, 2/6 systolic murmur at the apex. RESPIRATORY:  Clear to auscultation without rales, wheezing or rhonchi  ABDOMEN: Soft, non-tender, non-distended MUSCULOSKELETAL:  No edema; No deformity  SKIN: Warm and dry NEUROLOGIC:  Alert and oriented x 3 PSYCHIATRIC:  Normal affect   Signed, Jenean Lindau, MD  04/27/2018 8:30 AM    [place]

## 2018-04-29 ENCOUNTER — Telehealth: Payer: Self-pay | Admitting: Cardiology

## 2018-04-29 ENCOUNTER — Telehealth: Payer: Self-pay

## 2018-04-29 NOTE — Telephone Encounter (Signed)
Returned Andrea [name]'s call from yesterday about labs

## 2018-04-29 NOTE — Telephone Encounter (Signed)
Returned patient phone call left message to call back for results/advice.

## 2018-05-03 NOTE — Telephone Encounter (Signed)
Patient states she does not want to take any medications. That cholesterol meds will cause her increased joint pain causing mobility issues. She would prefer to try diet first and have follow up labs drawn in 6 mos at her next appt to monitor progress. Copy of results left at front desk per patient request.

## 2018-05-07 ENCOUNTER — Telehealth: Payer: Self-pay

## 2018-05-07 NOTE — Telephone Encounter (Signed)
Left message on patients home phone to call back for results.   

## 2018-09-19 ENCOUNTER — Other Ambulatory Visit: Payer: Self-pay | Admitting: Cardiology

## 2018-10-26 ENCOUNTER — Ambulatory Visit: Payer: Medicare Other | Admitting: Cardiology

## 2018-11-02 DIAGNOSIS — C801 Malignant (primary) neoplasm, unspecified: Secondary | ICD-10-CM

## 2018-11-02 HISTORY — DX: Malignant (primary) neoplasm, unspecified: C80.1

## 2018-11-03 ENCOUNTER — Other Ambulatory Visit: Payer: Self-pay

## 2018-11-03 ENCOUNTER — Encounter: Payer: Self-pay | Admitting: Cardiology

## 2018-11-03 ENCOUNTER — Ambulatory Visit (INDEPENDENT_AMBULATORY_CARE_PROVIDER_SITE_OTHER): Payer: Medicare Other | Admitting: Cardiology

## 2018-11-03 VITALS — BP 142/74 | HR 85 | Ht 62.0 in | Wt 135.8 lb

## 2018-11-03 DIAGNOSIS — I2510 Atherosclerotic heart disease of native coronary artery without angina pectoris: Secondary | ICD-10-CM

## 2018-11-03 DIAGNOSIS — E782 Mixed hyperlipidemia: Secondary | ICD-10-CM | POA: Diagnosis not present

## 2018-11-03 DIAGNOSIS — I10 Essential (primary) hypertension: Secondary | ICD-10-CM | POA: Diagnosis not present

## 2018-11-03 DIAGNOSIS — Z8679 Personal history of other diseases of the circulatory system: Secondary | ICD-10-CM | POA: Diagnosis not present

## 2018-11-03 DIAGNOSIS — Z1329 Encounter for screening for other suspected endocrine disorder: Secondary | ICD-10-CM

## 2018-11-03 NOTE — Progress Notes (Signed)
Cardiology Office Note:    Date:  11/03/2018   ID:  Andrea [name], DOB 09/10/1945, MRN [id number]  PCP:  Ronita Hipps, MD  Cardiologist:  Jenean Lindau, MD   Referring MD: Ronita Hipps, MD    ASSESSMENT:    1. Essential hypertension   2. Mixed hyperlipidemia   3. History of cardiomyopathy   4. Coronary artery disease involving native coronary artery of native heart without angina pectoris    PLAN:    In order of problems listed above:  1. Coronary artery disease: Secondary prevention stressed with the patient.  Importance of compliance with diet and medication stressed and she vocalized understanding. 2. Essential hypertension: Her blood pressure stable 3. Mixed dyslipidemia: She is doing well with her diet and will check blood work today including lipids 4. History of cardiomyopathy which is stable at this time but will monitor. 5. Patient will be seen in follow-up appointment in 6 months or earlier if the patient has any concerns    Medication Adjustments/Labs and Tests Ordered: Current medicines are reviewed at length with the patient today.  Concerns regarding medicines are outlined above.  No orders of the defined types were placed in this encounter.  No orders of the defined types were placed in this encounter.    No chief complaint on file.    History of Present Illness:    Andrea [name] is a 73 y.o. female.  Patient has history of cardiomyopathy.  She denies any problems at this time and takes care of activities of daily living.  No chest pain orthopnea or PND.  She exercises on a regular basis.  At the time of my evaluation, the patient is alert awake oriented and in no distress.  History reviewed. No pertinent past medical history.  Past Surgical History:  Procedure Laterality Date  . ABDOMINAL HYSTERECTOMY    . INNER EAR SURGERY    . WRIST SURGERY      Current Medications: Current Meds  Medication Sig  . Ascorbic Acid (VITAMIN C)  1000 MG tablet Take 1,000 mg by mouth daily.   Marland Kitchen aspirin EC 81 MG tablet Take 81 mg by mouth daily.   . beta carotene 10000 UNIT capsule Take 10,000 Units by mouth daily.   . Calcium Carb-Cholecalciferol (CALCIUM-VITAMIN D) 500-200 MG-UNIT tablet Take 1 tablet by mouth daily.   . carvedilol (COREG) 6.25 MG tablet TAKE 1/2 TABLET BY MOUTH TWICE DAILY  . DOCOSAHEXAENOIC ACID PO Take 1,000 mg by mouth 2 (two) times daily.   Marland Kitchen estradiol (CLIMARA - DOSED IN MG/24 HR) 0.025 mg/24hr patch APP 1 PA EXT TO THE SKIN 1 TIME A WK  . estradiol (VIVELLE-DOT) 0.025 MG/24HR Place 1 patch onto the skin once a week.   . Multiple Vitamins-Minerals (OCUVITE PO) Take 1 tablet by mouth daily.   Marland Kitchen omeprazole (PRILOSEC) 40 MG capsule Take 40 mg by mouth daily.   . Probiotic CAPS Take 1 capsule by mouth daily.   . vitamin B-12 (CYANOCOBALAMIN) 1000 MCG tablet Take 1,000 mcg by mouth daily.   Marland Kitchen zinc gluconate 50 MG tablet Take 50 mg by mouth daily.      Allergies:   Cortisone   Social History   Socioeconomic History  . Marital status: Divorced    Spouse name: Not on file  . Number of children: Not on file  . Years of education: Not on file  . Highest education level: Not on file  Occupational History  .  Not on file  Social Needs  . Financial resource strain: Not on file  . Food insecurity    Worry: Not on file    Inability: Not on file  . Transportation needs    Medical: Not on file    Non-medical: Not on file  Tobacco Use  . Smoking status: Never Smoker  . Smokeless tobacco: Never Used  Substance and Sexual Activity  . Alcohol use: No  . Drug use: No  . Sexual activity: Not on file  Lifestyle  . Physical activity    Days per week: Not on file    Minutes per session: Not on file  . Stress: Not on file  Relationships  . Social Herbalist on phone: Not on file    Gets together: Not on file    Attends religious service: Not on file    Active member of club or organization: Not on  file    Attends meetings of clubs or organizations: Not on file    Relationship status: Not on file  Other Topics Concern  . Not on file  Social History Narrative  . Not on file     Family History: The patient's family history includes Valvular heart disease in her brother.  ROS:   Please see the history of present illness.    All other systems reviewed and are negative.  EKGs/Labs/Other Studies Reviewed:    The following studies were reviewed today: EKG reveals sinus rhythm and nonspecific ST-T changes   Recent Labs: 04/27/2018: ALT 17; BUN 14; Creatinine, Ser 0.87; Hemoglobin 13.9; Platelets 251; Potassium 4.6; Sodium 140; TSH 0.872  Recent Lipid Panel    Component Value Date/Time   CHOL 260 (H) 04/27/2018 0845   TRIG 86 04/27/2018 0845   HDL 104 04/27/2018 0845   CHOLHDL 2.5 04/27/2018 0845   LDLCALC 139 (H) 04/27/2018 0845    Physical Exam:    VS:  There were no vitals taken for this visit.    Wt Readings from Last 3 Encounters:  04/27/18 137 lb 12.8 oz (62.5 kg)  10/27/17 136 lb (61.7 kg)  12/30/16 136 lb 0.6 oz (61.7 kg)     GEN: Patient is in no acute distress HEENT: Normal NECK: No JVD; No carotid bruits LYMPHATICS: No lymphadenopathy CARDIAC: Hear sounds regular, 2/6 systolic murmur at the apex. RESPIRATORY:  Clear to auscultation without rales, wheezing or rhonchi  ABDOMEN: Soft, non-tender, non-distended MUSCULOSKELETAL:  No edema; No deformity  SKIN: Warm and dry NEUROLOGIC:  Alert and oriented x 3 PSYCHIATRIC:  Normal affect   Signed, Jenean Lindau, MD  11/03/2018 8:29 AM    [facility]

## 2018-11-03 NOTE — Patient Instructions (Signed)
Medication Instructions:  Your physician recommends that you continue on your current medications as directed. Please refer to the Current Medication list given to you today.  If you need a refill on your cardiac medications before your next appointment, please call your pharmacy.   Lab work: Your physician recommends that you have a BMP, CBC, TSH, hepatic and lipids drawn.  If you have labs (blood work) drawn today and your tests are completely normal, you will receive your results only by: Marland Kitchen MyChart Message (if you have MyChart) OR . A paper copy in the mail If you have any lab test that is abnormal or we need to change your treatment, we will call you to review the results.  Testing/Procedures: You had an EKG perform today  Follow-Up: At [facility], you and your health needs are our priority.  As part of our continuing mission to provide you with exceptional heart care, we have created designated Provider Care Teams.  These Care Teams include your primary Cardiologist (physician) and Advanced Practice Providers (APPs -  Physician Assistants and Nurse Practitioners) who all work together to provide you with the care you need, when you need it. You will need a follow up appointment in 6 months.

## 2018-11-03 NOTE — Addendum Note (Signed)
Addended by: Beckey Rutter on: 11/03/2018 09:05 AM   Modules accepted: Orders

## 2018-11-04 ENCOUNTER — Telehealth: Payer: Self-pay | Admitting: *Deleted

## 2018-11-04 LAB — HEPATIC FUNCTION PANEL
ALT: 13 IU/L (ref 0–32)
AST: 17 IU/L (ref 0–40)
Albumin: 4.4 g/dL (ref 3.7–4.7)
Alkaline Phosphatase: 59 IU/L (ref 39–117)
Bilirubin Total: 0.4 mg/dL (ref 0.0–1.2)
Bilirubin, Direct: 0.12 mg/dL (ref 0.00–0.40)
Total Protein: 6.8 g/dL (ref 6.0–8.5)

## 2018-11-04 LAB — BASIC METABOLIC PANEL
BUN/Creatinine Ratio: 17 (ref 12–28)
BUN: 12 mg/dL (ref 8–27)
CO2: 26 mmol/L (ref 20–29)
Calcium: 9.7 mg/dL (ref 8.7–10.3)
Chloride: 103 mmol/L (ref 96–106)
Creatinine, Ser: 0.72 mg/dL (ref 0.57–1.00)
GFR calc Af Amer: 95 mL/min/{1.73_m2} (ref >59)
GFR calc non Af Amer: 82 mL/min/{1.73_m2} (ref >59)
Glucose: 102 mg/dL — ABNORMAL HIGH (ref 65–99)
Potassium: 4.4 mmol/L (ref 3.5–5.2)
Sodium: 142 mmol/L (ref 134–144)

## 2018-11-04 LAB — CBC
Hematocrit: 42.1 % (ref 34.0–46.6)
Hemoglobin: 13.7 g/dL (ref 11.1–15.9)
MCH: 31.3 pg (ref 26.6–33.0)
MCHC: 32.5 g/dL (ref 31.5–35.7)
MCV: 96 fL (ref 79–97)
Platelets: 225 10*3/uL (ref 150–450)
RBC: 4.38 x10E6/uL (ref 3.77–5.28)
RDW: 12.8 % (ref 11.7–15.4)
WBC: 4.5 10*3/uL (ref 3.4–10.8)

## 2018-11-04 LAB — LIPID PANEL
Chol/HDL Ratio: 2.6 ratio (ref 0.0–4.4)
Cholesterol, Total: 238 mg/dL — ABNORMAL HIGH (ref 100–199)
HDL: 92 mg/dL (ref >39)
LDL Chol Calc (NIH): 130 mg/dL — ABNORMAL HIGH (ref 0–99)
Triglycerides: 94 mg/dL (ref 0–149)
VLDL Cholesterol Cal: 16 mg/dL (ref 5–40)

## 2018-11-04 LAB — TSH: TSH: 0.872 u[IU]/mL (ref 0.450–4.500)

## 2018-11-04 NOTE — Telephone Encounter (Signed)
-----   Message from Jenean Lindau, MD sent at 11/04/2018  8:05 AM EDT ----- Lipids are elevated.  She needs to diet better and recheck in 6 months. Jenean Lindau, MD 11/04/2018 8:05 AM

## 2018-11-04 NOTE — Telephone Encounter (Signed)
Telephone call to patient. Left message regarding lab results,need to diet better and recheck in 6 months. Pt to call with any questions.

## 2018-11-10 DIAGNOSIS — Z01419 Encounter for gynecological examination (general) (routine) without abnormal findings: Secondary | ICD-10-CM | POA: Diagnosis not present

## 2018-11-10 DIAGNOSIS — N951 Menopausal and female climacteric states: Secondary | ICD-10-CM | POA: Diagnosis not present

## 2018-11-10 DIAGNOSIS — Z1231 Encounter for screening mammogram for malignant neoplasm of breast: Secondary | ICD-10-CM | POA: Diagnosis not present

## 2018-11-10 DIAGNOSIS — N958 Other specified menopausal and perimenopausal disorders: Secondary | ICD-10-CM | POA: Diagnosis not present

## 2018-11-10 DIAGNOSIS — Z6825 Body mass index (BMI) 25.0-25.9, adult: Secondary | ICD-10-CM | POA: Diagnosis not present

## 2018-11-15 ENCOUNTER — Other Ambulatory Visit: Payer: Self-pay | Admitting: Obstetrics and Gynecology

## 2018-11-15 DIAGNOSIS — R928 Other abnormal and inconclusive findings on diagnostic imaging of breast: Secondary | ICD-10-CM

## 2018-11-24 ENCOUNTER — Ambulatory Visit
Admission: RE | Admit: 2018-11-24 | Discharge: 2018-11-24 | Disposition: A | Discharge status: Home / Self Care | Payer: Medicare Other | Source: Ambulatory Visit | Attending: Obstetrics and Gynecology | Admitting: Obstetrics and Gynecology

## 2018-11-24 ENCOUNTER — Other Ambulatory Visit: Payer: Self-pay | Admitting: Obstetrics and Gynecology

## 2018-11-24 ENCOUNTER — Other Ambulatory Visit: Payer: Self-pay

## 2018-11-24 DIAGNOSIS — R920 Mammographic microcalcification found on diagnostic imaging of breast: Secondary | ICD-10-CM | POA: Diagnosis not present

## 2018-11-24 DIAGNOSIS — R928 Other abnormal and inconclusive findings on diagnostic imaging of breast: Secondary | ICD-10-CM

## 2018-11-29 ENCOUNTER — Other Ambulatory Visit: Payer: Self-pay | Admitting: Obstetrics and Gynecology

## 2018-11-29 ENCOUNTER — Other Ambulatory Visit: Payer: Self-pay

## 2018-11-29 ENCOUNTER — Other Ambulatory Visit (HOSPITAL_COMMUNITY): Payer: Self-pay | Admitting: Diagnostic Radiology

## 2018-11-29 ENCOUNTER — Ambulatory Visit
Admission: RE | Admit: 2018-11-29 | Discharge: 2018-11-29 | Disposition: A | Discharge status: Home / Self Care | Payer: Medicare Other | Source: Ambulatory Visit | Attending: Obstetrics and Gynecology | Admitting: Obstetrics and Gynecology

## 2018-11-29 DIAGNOSIS — D242 Benign neoplasm of left breast: Secondary | ICD-10-CM | POA: Diagnosis not present

## 2018-11-29 DIAGNOSIS — R928 Other abnormal and inconclusive findings on diagnostic imaging of breast: Secondary | ICD-10-CM

## 2018-11-29 DIAGNOSIS — D0512 Intraductal carcinoma in situ of left breast: Secondary | ICD-10-CM | POA: Diagnosis not present

## 2018-11-29 DIAGNOSIS — R921 Mammographic calcification found on diagnostic imaging of breast: Secondary | ICD-10-CM | POA: Diagnosis not present

## 2018-12-06 ENCOUNTER — Ambulatory Visit: Payer: Self-pay | Admitting: Surgery

## 2018-12-06 DIAGNOSIS — D0512 Intraductal carcinoma in situ of left breast: Secondary | ICD-10-CM

## 2018-12-06 NOTE — H&P (View-Only) (Signed)
Andrea [name] Documented: 12/06/2018 2:22 PM Location: [facility] Patient #: [id number] DOB: 1945-12-07 Married / Language: English / Race: White Female  History of Present Illness Andrea [name]. Andrea Ventress MD; 12/06/2018 3:06 PM) Patient words: Patient sent at the request of the [facility] [place] for [name] screen detected mammographic abnormality of her left breast. The patient denies any history of breast pain, nipple discharge or breast mass. Mammogram revealed an area in the left breast upper outer quadrant consistent with atypical microcalcifications measured 1.8 cm. 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 She is sure the biopsy. Otherwise has no other complaints                CLINICAL DATA: Patient was called back from screening mammogram for left breast calcifications.  EXAM: DIGITAL DIAGNOSTIC LEFT MAMMOGRAM WITH CAD  COMPARISON: Previous exam(s).  ACR Breast Density Category b: There are scattered areas of fibroglandular density.  FINDINGS: Additional imaging of the left breast was performed. There is interval development of branching linear calcifications in the anterior third of the upper outer quadrant of the breast spanning an area of 1.8 cm. There are highly suspicious for ductal carcinoma in-situ. There are additional grouped coarse heterogeneous calcifications in the posterior third of the upper-outer quadrant of the left breast spanning an area of 4 mm.  Mammographic images were processed with CAD.  IMPRESSION: Two areas of suspicious microcalcifications in the left breast.  RECOMMENDATION: Stereotactic core biopsies of the calcifications in the left breast is recommended.  I have discussed the findings and recommendations with the patient. If applicable, [name] reminder letter will be sent to the patient regarding the next appointment.  BI-RADS CATEGORY 5: Highly  suggestive of malignancy.   Electronically Signed By: Andrea [name] M.D. On: 11/24/2018 10:19            ADDITIONAL INFORMATION: 1. PROGNOSTIC INDICATORS Results: IMMUNOHISTOCHEMICAL AND MORPHOMETRIC ANALYSIS PERFORMED MANUALLY Estrogen Receptor: 100%, POSITIVE, STRONG STAINING INTENSITY Progesterone Receptor: 10%, POSITIVE, STRONG STAINING INTENSITY REFERENCE RANGE ESTROGEN RECEPTOR NEGATIVE 0% POSITIVE =>1% REFERENCE RANGE PROGESTERONE RECEPTOR NEGATIVE 0% POSITIVE =>1% All controls stained appropriately Andrea Cutter MD Pathologist, Electronic Signature ( Signed 12/01/2018) FINAL DIAGNOSIS Diagnosis 1. Breast, left, needle core biopsy, upper outer quadrant anterior depth - DUCTAL CARCINOMA IN SITU WITH CALCIFICATIONS. - SEE COMMENT. 2. Breast, left, needle core biopsy, upper outer quadrant, posterior depth - FIBROADENOMA WITH CALCIFICATIONS - THERE IS NO EVIDENCE OF MALIGNANCY. 1 of 2 FINAL for Andrea [name], Andrea [name] ([id number]) Microscopic Comment 1. The carcinoma appears high grade. Estrogen receptor and progesterone receptor studies will be performed and the results reported separately. The results were called to the Andrea [name] on 11/30/2018. Andrea Cutter MD Pathologist, Electronic Signature (Case signed 11/30/2018) Specimen.  The patient is [name] 73 year old female.   Diagnostic Studies History Andrea [name], CMA; 12/06/2018 2:22 PM) Mammogram within last year  Allergies Andrea [name], CMA; 12/06/2018 2:22 PM) Cortisone Acetate *CORTICOSTEROIDS*  Medication History Andrea [name], CMA; 12/06/2018 2:25 PM) Carvedilol (6.25MG  Tablet, Oral) Active. Vitamin C (1000MG  Tablet, Oral) Active. Aspirin (81MG  Tablet, Oral) Active. Beta Carotene (10000UNIT Capsule, Oral) Active. Calcium Carbonate-Vitamin D (500-200MG -UNIT Tablet, Oral) Active. Multi-Vitamin (Oral) Active. Omeprazole (40MG  Capsule DR, Oral) Active. Probiotic  (Oral) Active. Vitamin B-12 (1000MCG Tablet, Oral) Active. Zinc Gluconate (50MG  Tablet, Oral) Active. Medications Reconciled  Pregnancy / Birth History Andrea [name], CMA; 12/06/2018 2:22 PM) Age of menopause [age]  Review of Systems ([place]. [name] CMA; 12/06/2018 2:22 PM) General Not Present- Appetite Loss, Chills, Fatigue, Fever, Night Sweats, Weight Gain and Weight Loss. HEENT Present- Seasonal Allergies. Not Present- Earache, Hearing Loss, Hoarseness, Nose Bleed, Oral Ulcers, Ringing in the Ears, Sinus Pain, Sore Throat, Visual Disturbances, Wears glasses/contact lenses and Yellow Eyes. Respiratory Not Present- Bloody sputum, Chronic Cough, Difficulty Breathing, Snoring and Wheezing. Cardiovascular Not Present- Chest Pain, Difficulty Breathing Lying Down, Leg Cramps, Palpitations, Rapid Heart Rate, Shortness of Breath and Swelling of Extremities.  Vitals Andrea [name] CMA; 12/06/2018 2:22 PM) 12/06/2018 2:22 PM Weight: 134.13 lb Height: 62in Body Surface Area: 1.61 m Body Mass Index: 24.53 kg/m  Pulse: 66 (Regular)  BP: 152/92 (Sitting, Left Arm, Standard)        Physical Exam (Andrea [name]. Andrea Balis MD; 12/06/2018 3:06 PM)  General Mental Status-Alert. General Appearance-Consistent with stated age. Hydration-Well hydrated. Voice-Normal.  Head and Neck Head-normocephalic, atraumatic with no lesions or palpable masses. Trachea-midline. Thyroid Gland Characteristics - normal size and consistency.  Breast Note: Bruising of her left breast. No masses. Right breast is normal  Neurologic Neurologic evaluation reveals -alert and oriented x 3 with no impairment of recent or remote memory. Mental Status-Normal.  Musculoskeletal Normal Exam - Left-Upper Extremity Strength Normal and Lower Extremity Strength Normal. Normal Exam - Right-Upper Extremity Strength Normal and Lower Extremity Strength Normal.  Lymphatic Head &  Neck  General Head & Neck Lymphatics: Bilateral - Description - Normal. Axillary  General Axillary Region: Bilateral - Description - Normal. Tenderness - Non Tender.    Assessment & Plan (Andrea [name]. Andrea Feeny MD; 12/06/2018 3:07 PM)  BREAST NEOPLASM, TIS (DCIS), LEFT (D05.12) Impression: Discussed lumpectomy with possible postoperative radiation therapy versus mastectomy with or without reconstruction. She has chosen left breast seed localized lumpectomy. Risk of lumpectomy include bleeding, infection, seroma, more surgery, use of seed/wire, wound care, cosmetic deformity and the need for other treatments, death , blood clots, death. Pt agrees to proceed.  Current Plans You are being scheduled for surgery- Our schedulers will call you.  You should hear from our office's scheduling department within 5 working days about the location, date, and time of surgery. We try to make accommodations for patient's preferences in scheduling surgery, but sometimes the OR schedule or the surgeon's schedule prevents [place] from making those accommodations.  If you have not heard from our office [phone]) in 5 working days, call the office and ask for your surgeon's nurse.  If you have other questions about your diagnosis, plan, or surgery, call the office and ask for your surgeon's nurse.  Pt Education - [facility] Breast Cancer Information Given - [facility] "Breast Journey" Package Pt Education - [facility] Breast Biopsy HCI: discussed with patient and provided information. We discussed the staging and pathophysiology of breast cancer. We discussed all of the different options for treatment for breast cancer including surgery, chemotherapy, radiation therapy, Herceptin, and antiestrogen therapy. We discussed [name] sentinel lymph node biopsy as she does not appear to having lymph node involvement right now. We discussed the performance of that with injection of radioactive tracer and blue dye. We discussed that she would have  an incision underneath her axillary hairline. We discussed that there is [name] bout [name] 10-20% chance of having [name] positive node with [name] sentinel lymph node biopsy and we will await the permanent pathology to make any other first further decisions in terms of her treatment. One of these options might be to return to the operating room  to perform an axillary lymph node dissection. We discussed about [name] 1-2% risk lifetime of chronic shoulder pain as well as lymphedema associated with [name] sentinel lymph node biopsy. We discussed the options for treatment of the breast cancer which included lumpectomy versus [name] mastectomy. We discussed the performance of the lumpectomy with [name] wire placement. We discussed [name] 10-20% chance of [name] positive margin requiring reexcision in the operating room. We also discussed that she may need radiation therapy or antiestrogen therapy or both if she undergoes lumpectomy. We discussed the mastectomy and the postoperative care for that as well. We discussed that there is no difference in her survival whether she undergoes lumpectomy with radiation therapy or antiestrogen therapy versus [name] mastectomy. There is [name] slight difference in the local recurrence rate being 3-5% with lumpectomy and about 1% with [name] mastectomy. We discussed the risks of operation including bleeding, infection, possible reoperation. She understands her further therapy will be based on what her stages at the time of her operation.  Pt Education - [facility] breast cancer surgery: discussed with patient and provided information.

## 2018-12-06 NOTE — H&P (Signed)
Andrea [name] Documented: 12/06/2018 2:22 PM Location: [facility] Patient #: [id number] DOB: [birth year] Married / Language: English / Race: White [name]  History of Present Illness Andrea [name]. Andrea Epple MD; 12/06/2018 3:06 PM) Patient words: Patient sent at the request of the [facility] [place] for [name] screen detected mammographic abnormality of her left breast. The patient denies any history of breast pain, nipple discharge or breast mass. Mammogram revealed an area in the left breast upper outer quadrant consistent with atypical microcalcifications measured 1.8 cm. 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 She is sure the biopsy. Otherwise has no other complaints                CLINICAL DATA: Patient was called back from screening mammogram for left breast calcifications.  EXAM: DIGITAL DIAGNOSTIC LEFT MAMMOGRAM WITH CAD  COMPARISON: Previous exam(s).  ACR Breast Density Category b: There are scattered areas of fibroglandular density.  FINDINGS: Additional imaging of the left breast was performed. There is interval development of branching linear calcifications in the anterior third of the upper outer quadrant of the breast spanning an area of 1.8 cm. There are highly suspicious for ductal carcinoma in-situ. There are additional grouped coarse heterogeneous calcifications in the posterior third of the upper-outer quadrant of the left breast spanning an area of 4 mm.  Mammographic images were processed with CAD.  IMPRESSION: Two areas of suspicious microcalcifications in the left breast.  RECOMMENDATION: Stereotactic core biopsies of the calcifications in the left breast is recommended.  I have discussed the findings and recommendations with the patient. If applicable, [name] reminder letter will be sent to the patient regarding the next appointment.  BI-RADS CATEGORY 5: Highly  suggestive of malignancy.   Electronically Signed By: Andrea [name] M.D. On: 11/24/2018 10:19            ADDITIONAL INFORMATION: 1. PROGNOSTIC INDICATORS Results: IMMUNOHISTOCHEMICAL AND MORPHOMETRIC ANALYSIS PERFORMED MANUALLY Estrogen Receptor: 100%, POSITIVE, STRONG STAINING INTENSITY Progesterone Receptor: 10%, POSITIVE, STRONG STAINING INTENSITY REFERENCE RANGE ESTROGEN RECEPTOR NEGATIVE 0% POSITIVE =>1% REFERENCE RANGE PROGESTERONE RECEPTOR NEGATIVE 0% POSITIVE =>1% All controls stained appropriately Andrea [name], Electronic Signature ( Signed 12/01/2018) FINAL DIAGNOSIS Diagnosis 1. Breast, left, needle core biopsy, upper outer quadrant anterior depth - DUCTAL CARCINOMA IN SITU WITH CALCIFICATIONS. - SEE COMMENT. 2. Breast, left, needle core biopsy, upper outer quadrant, posterior depth - FIBROADENOMA WITH CALCIFICATIONS - THERE IS NO EVIDENCE OF MALIGNANCY. 1 of 2 FINAL for Andrea [name], Andrea [name] ([id number]) Microscopic Comment 1. The carcinoma appears high grade. Estrogen receptor and progesterone receptor studies will be performed and the results reported separately. The results were called to the [facility] on 11/30/2018. Andrea [name], Electronic Signature (Case signed 11/30/2018) Specimen.  The patient is Andrea [name].   Diagnostic Studies History Sharyn Lull R. Brooks, CMA; 12/06/2018 2:22 PM) Mammogram within last year  Allergies Sharyn Lull R. Brooks, CMA; 12/06/2018 2:22 PM) Cortisone Acetate *CORTICOSTEROIDS*  Medication History Sharyn Lull R. Brooks, CMA; 12/06/2018 2:25 PM) Carvedilol (6.25MG  Tablet, Oral) Active. Vitamin C (1000MG  Tablet, Oral) Active. Aspirin (81MG  Tablet, Oral) Active. Beta Carotene (10000UNIT Capsule, Oral) Active. Calcium Carbonate-Vitamin D (500-200MG -UNIT Tablet, Oral) Active. Multi-Vitamin (Oral) Active. Omeprazole (40MG  Capsule DR, Oral) Active. Probiotic  (Oral) Active. Vitamin B-12 (1000MCG Tablet, Oral) Active. Zinc Gluconate (50MG  Tablet, Oral) Active. Medications Reconciled  Pregnancy / Birth History Sharyn Lull R. Brooks, CMA; 12/06/2018 2:22 PM) [age] of menopause 49-55  Review of Systems (Martin. Brooks CMA; 12/06/2018 2:22 PM) General Not Present- Appetite Loss, Chills, Fatigue, Fever, Night Sweats, Weight Gain and Weight Loss. HEENT Present- Seasonal Allergies. Not Present- Earache, Hearing Loss, Hoarseness, Nose Bleed, Oral Ulcers, Ringing in the Ears, Sinus Pain, Sore Throat, Visual Disturbances, Wears glasses/contact lenses and Yellow Eyes. Respiratory Not Present- Bloody sputum, Chronic Cough, Difficulty Breathing, Snoring and Wheezing. Cardiovascular Not Present- Chest Pain, Difficulty Breathing Lying Down, Leg Cramps, Palpitations, Rapid Heart Rate, Shortness of Breath and Swelling of Extremities.  Vitals [employer] R. Brooks CMA; 12/06/2018 2:22 PM) 12/06/2018 2:22 PM Weight: 134.13 lb Height: 62in Body Surface Area: 1.61 m Body Mass Index: 24.53 kg/m  Pulse: 66 (Regular)  BP: 152/92 (Sitting, Left Arm, Standard)        Physical Exam (Calob Baskette [name]. Camielle Sizer MD; 12/06/2018 3:06 PM)  General Mental Status-Alert. General Appearance-Consistent with stated age. Hydration-Well hydrated. Voice-Normal.  Head and Neck Head-normocephalic, atraumatic with no lesions or palpable masses. Trachea-midline. Thyroid Gland Characteristics - normal size and consistency.  Breast Note: Bruising of her left breast. No masses. Right breast is normal  Neurologic Neurologic evaluation reveals -alert and oriented x 3 with no impairment of recent or remote memory. Mental Status-Normal.  Musculoskeletal Normal Exam - Left-Upper Extremity Strength Normal and Lower Extremity Strength Normal. Normal Exam - Right-Upper Extremity Strength Normal and Lower Extremity Strength Normal.  Lymphatic Head &  Neck  General Head & Neck Lymphatics: Bilateral - Description - Normal. Axillary  General Axillary Region: Bilateral - Description - Normal. Tenderness - Non Tender.    Assessment & Plan (Raynette Arras [name]. Emireth Cockerham MD; 12/06/2018 3:07 PM)  BREAST NEOPLASM, TIS (DCIS), LEFT (D05.12) Impression: Discussed lumpectomy with possible postoperative radiation therapy versus mastectomy with or without reconstruction. She has chosen left breast seed localized lumpectomy. Risk of lumpectomy include bleeding, infection, seroma, more surgery, use of seed/wire, wound care, cosmetic deformity and the need for other treatments, death , blood clots, death. Pt agrees to proceed.  Current Plans You are being scheduled for surgery- Our schedulers will call you.  You should hear from our office's scheduling department within 5 working days about the location, date, and time of surgery. We try to make accommodations for patient's preferences in scheduling surgery, but sometimes the OR schedule or the surgeon's schedule prevents [place] from making those accommodations.  If you have not heard from our office [phone]) in 5 working days, call the office and ask for your surgeon's nurse.  If you have other questions about your diagnosis, plan, or surgery, call the office and ask for your surgeon's nurse.  Pt Education - [facility] Breast Cancer Information Given - [facility] "Breast Journey" Package Pt Education - [facility] Breast Biopsy HCI: discussed with patient and provided information. We discussed the staging and pathophysiology of breast cancer. We discussed all of the different options for treatment for breast cancer including surgery, chemotherapy, radiation therapy, Herceptin, and antiestrogen therapy. We discussed [name] sentinel lymph node biopsy as she does not appear to having lymph node involvement right now. We discussed the performance of that with injection of radioactive tracer and blue dye. We discussed that she would have  an incision underneath her axillary hairline. We discussed that there is [name] bout [name] 10-20% chance of having [name] positive node with [name] sentinel lymph node biopsy and we will await the permanent pathology to make any other first further decisions in terms of her treatment. One of these options might be to return to the operating room  to perform an axillary lymph node dissection. We discussed about [name] 1-2% risk lifetime of chronic shoulder pain as well as lymphedema associated with [name] sentinel lymph node biopsy. We discussed the options for treatment of the breast cancer which included lumpectomy versus [name] mastectomy. We discussed the performance of the lumpectomy with [name] wire placement. We discussed [name] 10-20% chance of [name] positive margin requiring reexcision in the operating room. We also discussed that she may need radiation therapy or antiestrogen therapy or both if she undergoes lumpectomy. We discussed the mastectomy and the postoperative care for that as well. We discussed that there is no difference in her survival whether she undergoes lumpectomy with radiation therapy or antiestrogen therapy versus [name] mastectomy. There is [name] slight difference in the local recurrence rate being 3-5% with lumpectomy and about 1% with [name] mastectomy. We discussed the risks of operation including bleeding, infection, possible reoperation. She understands her further therapy will be based on what her stages at the time of her operation.  Pt Education - [facility] breast cancer surgery: discussed with patient and provided information.

## 2018-12-08 DIAGNOSIS — Z23 Encounter for immunization: Secondary | ICD-10-CM | POA: Diagnosis not present

## 2018-12-09 ENCOUNTER — Encounter: Payer: Self-pay | Admitting: Radiation Oncology

## 2018-12-09 ENCOUNTER — Other Ambulatory Visit: Payer: Self-pay

## 2018-12-09 ENCOUNTER — Ambulatory Visit
Admission: RE | Admit: 2018-12-09 | Discharge: 2018-12-09 | Disposition: A | Discharge status: Home / Self Care | Payer: Medicare Other | Source: Ambulatory Visit | Attending: Radiation Oncology | Admitting: Radiation Oncology

## 2018-12-09 DIAGNOSIS — D0512 Intraductal carcinoma in situ of left breast: Secondary | ICD-10-CM | POA: Insufficient documentation

## 2018-12-09 DIAGNOSIS — Z170 Estrogen receptor positive status [ER+]: Secondary | ICD-10-CM | POA: Diagnosis not present

## 2018-12-09 DIAGNOSIS — Z7982 Long term (current) use of aspirin: Secondary | ICD-10-CM | POA: Insufficient documentation

## 2018-12-09 DIAGNOSIS — Z79899 Other long term (current) drug therapy: Secondary | ICD-10-CM | POA: Diagnosis not present

## 2018-12-09 DIAGNOSIS — R921 Mammographic calcification found on diagnostic imaging of breast: Secondary | ICD-10-CM | POA: Diagnosis not present

## 2018-12-09 DIAGNOSIS — C50412 Malignant neoplasm of upper-outer quadrant of left female breast: Secondary | ICD-10-CM

## 2018-12-09 HISTORY — DX: Intraductal carcinoma in situ of left breast: D05.12

## 2018-12-09 NOTE — Progress Notes (Signed)
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  Did patient present with symptoms (if so, please note symptoms) or was this found on screening mammography?: screen detected mammographic abnormality of her left breast. The patient denies any history of breast pain, nipple discharge or breast mass. Mammogram revealed an area in the left breast upper outer quadrant consistent with atypical microcalcifications measured 1.8 cm.    Past/Anticipated interventions by surgeon, if any: Pt to be scheduled for: left breast seed localized lumpectomy with Dr. Brantley Stage  Past/Anticipated interventions by medical oncology, if any: Chemotherapy None at this time.  Lymphedema issues, if any:  Pt has not had surgery at this time.  Pain issues, if any:  Pt reports breast is "sore" and rates pain 3-4/10.   SAFETY ISSUES:  Prior radiation? No  Pacemaker/ICD? No  Possible current pregnancy? No  Is the patient on methotrexate? No  Current Complaints / other details:  Pt presents today for initial consult with Dr. Sondra Come for Radiation Oncology. Pt is accompanied by husband.   BP (!) 174/91 (BP Location: Left Arm, Patient Position: Sitting)   Pulse 80   Temp 98.7 F (37.1 C) (Temporal)   Resp 18   Ht 5\' 2"  (1.575 m)   Wt 132 lb 2 oz (59.9 kg)   SpO2 99%   BMI 24.17 kg/m   Wt Readings from Last 3 Encounters:  12/09/18 132 lb 2 oz (59.9 kg)  11/03/18 135 lb 12.8 oz (61.6 kg)  04/27/18 137 lb 12.8 oz (62.5 kg)        Loma Sousa, RN 12/09/2018,2:58 PM

## 2018-12-09 NOTE — Patient Instructions (Signed)
Coronavirus (COVID-19) Are you at risk?  Are you at risk for the Coronavirus (COVID-19)?  To be considered HIGH RISK for Coronavirus (COVID-19), you have to meet the following criteria:  . Traveled to [place], [place], [place], [place] or [place]; or in the [place] to [place], [place], [place], or [place]; and have fever, cough, and shortness of breath within the last 2 weeks of travel OR . Been in close contact with a person diagnosed with COVID-19 within the last 2 weeks and have fever, cough, and shortness of breath . IF YOU DO NOT MEET THESE CRITERIA, YOU ARE CONSIDERED LOW RISK FOR COVID-19.  What to do if you are HIGH RISK for COVID-19?  . If you are having a medical emergency, call 911. . Seek medical care right away. Before you go to a doctor's office, urgent care or emergency department, call ahead and tell them about your recent travel, contact with someone diagnosed with COVID-19, and your symptoms. You should receive instructions from your physician's office regarding next steps of care.  . When you arrive at healthcare provider, tell the healthcare staff immediately you have returned from visiting [place], [place], [place], [place] or [place]; or traveled in the [place] to [place], [place], [place], or [place]; in the last two weeks or you have been in close contact with a person diagnosed with COVID-19 in the last 2 weeks.   . Tell the health care staff about your symptoms: fever, cough and shortness of breath. . After you have been seen by a medical provider, you will be either: o Tested for (COVID-19) and discharged home on quarantine except to seek medical care if symptoms worsen, and asked to  - Stay home and avoid contact with others until you get your results (4-5 days)  - Avoid travel on public transportation if possible (such as bus, train, or airplane) or o Sent to the Emergency Department by EMS for evaluation, COVID-19 testing, and possible  admission depending on your condition and test results.  What to do if you are LOW RISK for COVID-19?  Reduce your risk of any infection by using the same precautions used for avoiding the common cold or flu:  . Wash your hands often with soap and warm water for at least 20 seconds.  If soap and water are not readily available, use an alcohol-based hand sanitizer with at least 60% alcohol.  . If coughing or sneezing, cover your mouth and nose by coughing or sneezing into the elbow areas of your shirt or coat, into a tissue or into your sleeve (not your hands). . Avoid shaking hands with others and consider head nods or verbal greetings only. . Avoid touching your eyes, nose, or mouth with unwashed hands.  . Avoid close contact with people who are sick. . Avoid places or events with large numbers of people in one location, like concerts or sporting events. . Carefully consider travel plans you have or are making. . If you are planning any travel outside or inside the [place], visit the CDC's Travelers' Health webpage for the latest health notices. . If you have some symptoms but not all symptoms, continue to monitor at home and seek medical attention if your symptoms worsen. . If you are having a medical emergency, call 911.   ADDITIONAL HEALTHCARE OPTIONS FOR PATIENTS  [facility] / e-Visit: [email or web address]         [facility]: [phone]  [facility]: [phone]                   [facility]: [phone]   

## 2018-12-09 NOTE — Progress Notes (Signed)
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 female who is accompanied by her husband. She had routine screening mammography on 11/16/2018 showing calcifications in the left breast. She underwent left diagnostic mammography on 11/24/2018 showing: two areas of suspicious microcalcifications in the left breast-- 1.8 cm in the anterior third of the upper-outer quadrant, and 4 mm in the posterior third of the upper-outer quadrant.  Biopsy on 11/29/2018 showed: fibroadenoma with calcifications at posterior depth; high grade DCIS with calcifications at anterior depth. Prognostic indicators significant for: estrogen receptor, 100% positive and progesterone receptor, 10% positive.    PREVIOUS RADIATION THERAPY: No  PAST MEDICAL HISTORY: History reviewed. No pertinent past medical history.  PAST SURGICAL HISTORY: Past Surgical History:  Procedure Laterality Date   ABDOMINAL HYSTERECTOMY     INNER EAR SURGERY     WRIST SURGERY      FAMILY HISTORY:  Family History  Problem Relation Age of Onset   Valvular heart disease Brother     SOCIAL HISTORY:  Social History   Tobacco Use   Smoking status: Never Smoker   Smokeless tobacco: Never Used  Substance Use Topics   Alcohol use: No   Drug use: No    ALLERGIES:  Allergies  Allergen Reactions   Cortisone     MEDICATIONS:  Current Outpatient Medications  Medication Sig Dispense Refill   Ascorbic Acid (VITAMIN C) 1000 MG tablet Take 1,000 mg by mouth daily.      aspirin EC 81 MG  tablet Take 81 mg by mouth daily.      beta carotene 10000 UNIT capsule Take 10,000 Units by mouth daily.      Calcium Carb-Cholecalciferol (CALCIUM-VITAMIN D) 500-200 MG-UNIT tablet Take 1 tablet by mouth daily.      carvedilol (COREG) 6.25 MG tablet TAKE 1/2 TABLET BY MOUTH TWICE DAILY 180 tablet 0   Multiple Vitamins-Minerals (OCUVITE PO) Take 1 tablet by mouth daily.      omeprazole (PRILOSEC) 40 MG capsule Take 40 mg by mouth daily.      Probiotic CAPS Take 1 capsule by mouth daily.      vitamin B-12 (CYANOCOBALAMIN) 1000 MCG tablet Take 1,000 mcg by mouth daily.      zinc gluconate 50 MG tablet Take 50 mg by mouth daily.      DOCOSAHEXAENOIC ACID PO Take 1,000 mg by mouth 2 (two) times daily.      estradiol (CLIMARA - DOSED IN MG/24 HR) 0.025 mg/24hr patch APP 1 PA EXT TO THE SKIN 1 TIME A WK     estradiol (VIVELLE-DOT) 0.025 MG/24HR Place 1 patch onto the skin once a week.      No current facility-administered medications for this encounter.     REVIEW OF SYSTEMS:  A 10+ POINT REVIEW OF SYSTEMS WAS OBTAINED including neurology, dermatology, psychiatry, cardiac, respiratory, lymph, extremities, GI, GU, musculoskeletal, constitutional, reproductive, HEENT. She reports soreness to her breast related to her biopsy, which she rates the pain as a 3-4/10.   PHYSICAL EXAM:  height is 5\' 2"  (1.575 m)  and weight is 132 lb 2 oz (59.9 kg). Her temporal temperature is 98.7 F (37.1 C). Her blood pressure is 174/91 (abnormal) and her pulse is 80. Her respiration is 18 and oxygen saturation is 99%.   General: Alert and oriented, in no acute distress HEENT: Head is normocephalic. Extraocular movements are intact. Oropharynx is clear. She is hard of hearing. Neck: Neck is supple, no palpable cervical or supraclavicular lymphadenopathy. Heart: Regular in rate and rhythm with no murmurs, rubs, or gallops. Chest: Clear to auscultation bilaterally, with no rhonchi, wheezes, or rales. Abdomen:  Soft, nontender, nondistended, with no rigidity or guarding. Extremities: No cyanosis or edema. Lymphatics: see Neck Exam Skin: No concerning lesions. Musculoskeletal: symmetric strength and muscle tone throughout. Neurologic: Cranial nerves II through XII are grossly intact. No obvious focalities. Speech is fluent. Coordination is intact. Psychiatric: Judgment and insight are intact. Affect is appropriate. Right Breast: no palpable mass, nipple discharge or bleeding. Left Breast: patient has some bruising adjacent to her two biopsy sites within the breast. No nipple discharge or bleeding.  No palpable mass  ECOG = 1  0 - Asymptomatic (Fully active, able to carry on all predisease activities without restriction)  1 - Symptomatic but completely ambulatory (Restricted in physically strenuous activity but ambulatory and able to carry out work of a light or sedentary nature. For example, light housework, office work)  2 - Symptomatic, <50% in bed during the day (Ambulatory and capable of all self care but unable to carry out any work activities. Up and about more than 50% of waking hours)  3 - Symptomatic, >50% in bed, but not bedbound (Capable of only limited self-care, confined to bed or chair 50% or more of waking hours)  4 - Bedbound (Completely disabled. Cannot carry on any self-care. Totally confined to bed or chair)  5 - Death   Andrea [name] MM, Andrea [name], Andrea [name], et al. [phone]). "Toxicity and response criteria of the [facility]". [place] Oncol. 5 (6): 649-55  LABORATORY DATA:  Lab Results  Component Value Date   WBC 4.5 11/03/2018   HGB 13.7 11/03/2018   HCT 42.1 11/03/2018   MCV 96 11/03/2018   PLT 225 11/03/2018   NEUTROABS 3.3 10/27/2017   Lab Results  Component Value Date   NA 142 11/03/2018   K 4.4 11/03/2018   CL 103 11/03/2018   CO2 26 11/03/2018   GLUCOSE 102 (H) 11/03/2018   CREATININE 0.72 11/03/2018   CALCIUM 9.7 11/03/2018       RADIOGRAPHY: Mm Digital Diagnostic Unilat L  Result Date: 11/24/2018 CLINICAL DATA:  Patient was called back from screening mammogram for left breast calcifications. EXAM: DIGITAL DIAGNOSTIC LEFT MAMMOGRAM WITH CAD COMPARISON:  Previous exam(s). ACR Breast Density Category b: There are scattered areas of fibroglandular density. FINDINGS: Additional imaging of the left breast was performed. There is interval development of branching linear calcifications in the anterior third of the upper outer quadrant of the breast spanning an area of 1.8 cm. There are highly suspicious for ductal carcinoma in-situ. There are additional grouped coarse heterogeneous calcifications in the posterior third of the upper-outer quadrant of the left breast spanning an area of 4 mm. Mammographic images were processed with CAD. IMPRESSION: Two areas of suspicious microcalcifications in the left breast. RECOMMENDATION: Stereotactic core biopsies of the calcifications in the left breast is recommended. I have discussed the findings and recommendations with the patient. If applicable, a reminder letter will be sent to the  patient regarding the next appointment. BI-RADS CATEGORY  5: Highly suggestive of malignancy. Electronically Signed   By: Lillia Mountain M.D.   On: 11/24/2018 10:19   Mm Clip Placement Left  Result Date: 11/29/2018 CLINICAL DATA:  Confirmation of clip placement after stereotactic tomosynthesis guided biopsy of 2 groups of calcifications involving the UPPER OUTER QUADRANT of the LEFT breast. EXAM: 2D AND TOMOSYNTHESIS DIAGNOSTIC LEFT MAMMOGRAM POST STEREOTACTIC BIOPSY COMPARISON:  Previous exam(s). FINDINGS: 2D and tomosynthesis full field CC and MLO images were obtained following stereotactic tomosynthesis guided biopsy of 2 separate groups of calcifications in the UPPER OUTER QUADRANT of the LEFT breast. The coil shaped tissue marker clip placed at the site of the ANTERIOR calcifications migrated approximately 1.7 cm  INFERIOR to the calcifications. There are residual calcifications at the biopsy site if localization becomes necessary. The X shaped tissue marker clip placed at the site of the POSTERIOR calcifications migrated approximately 2.3 cm INFERIOR to the calcifications. There are residual calcifications at the biopsy site if localization becomes necessary. Expected post biopsy changes are present at both sites without evidence of hematoma. IMPRESSION: 1. Approximate 1.7 cm INFERIOR migration of the coil shaped tissue marker clip relative to the biopsied calcifications in the UPPER OUTER QUADRANT at ANTERIOR depth. There are residual calcifications at the biopsy site should localization be necessary. 2. Approximate 2.3 cm INFERIOR migration of the X shaped tissue marker clip relative to the biopsied calcifications in the UPPER OUTER QUADRANT at POSTERIOR depth. There are residual calcifications at the biopsy site should localization be necessary. Final Assessment: Post Procedure Mammograms for Marker Placement Electronically Signed   By: Evangeline Dakin M.D.   On: 11/29/2018 10:31   Mm Lt Breast Bx W Loc Dev 1st Lesion Image Bx Spec Stereo Guide  Addendum Date: 12/01/2018   ADDENDUM REPORT: 12/01/2018 13:25 ADDENDUM: Pathology revealed HIGH GRADE DUCTAL CARCINOMA IN SITU WITH CALCIFICATIONS of the LEFT breast, upper outer quadrant anterior depth. This was found to be concordant by Dr. Peggye Fothergill. Pathology revealed FIBROADENOMA WITH CALCIFICATIONS of the LEFT breast, upper outer quadrant, posterior depth. This was found to be concordant by Dr. Peggye Fothergill. Pathology results were discussed with the patient by telephone. The patient reported doing well after the biopsies with tenderness at the sites. Post biopsy instructions and care were reviewed and questions were answered. The patient was encouraged to call [facility] [place] for any additional concerns. Surgical consultation has been  arranged with Dr. Erroll Luna at [facility] on December 06, 2018 per patient request. Bilateral Breast MRI is recommended to determine extent of disease per Dr. Purcell Nails. Pathology results reported by Stacie Acres, RN on 12/01/2018. Electronically Signed   By: Evangeline Dakin M.D.   On: 12/01/2018 13:25   Result Date: 12/01/2018 CLINICAL DATA:  74 year old with 2 groups of screening detected calcifications in the UPPER OUTER QUADRANT of the LEFT breast. There is a 2.1 cm group of suspicious calcifications at ANTERIOR depth and there is a 0.4 cm group of indeterminate calcifications at POSTERIOR depth (near the 9 o'clock location). EXAM: LEFT BREAST STEREOTACTIC CORE NEEDLE BIOPSY COMPARISON:  Previous exams. FINDINGS: The patient and I discussed the procedure of stereotactic-guided biopsy including benefits and alternatives. We discussed the high likelihood of a successful procedure. We discussed the risks of the procedure including infection, bleeding, tissue injury, clip migration, and inadequate sampling. Informed written consent was given. The usual time out protocol was performed immediately prior to the procedure. #  1) ANTERIOR group: Lesion quadrant: UPPER OUTER QUADRANT. Initially, using sterile technique with chlorhexidine as skin antisepsis, 1% lidocaine and 1% lidocaine with epinephrine as local anesthetic, under stereotactic guidance, a 9 gauge Brevera vacuum assisted device was used to perform core needle biopsy of calcifications involving the UPPER OUTER QUADRANT of the LEFT breast at ANTERIOR depth using a SUPERIOR approach. Specimen radiograph was performed showing calcifications in all 4 of the core samples. Specimens with calcifications are identified for pathology. At the conclusion of the procedure, a coil shaped tissue marker clip was deployed into the biopsy cavity. # 2: POSTERIOR group: Lesion quadrant: Slight UPPER OUTER QUADRANT (near 9 o'clock). Using sterile technique with  chlorhexidine as skin antisepsis, 1% lidocaine and 1% lidocaine with epinephrine as local anesthetic, under stereotactic guidance, a 9 gauge Brevera vacuum assisted device was used to perform core needle biopsy of calcifications involving the UPPER OUTER QUADRANT of the LEFT breast at POSTERIOR depth, near 9 o'clock location, using a SUPERIOR approach. Specimen radiograph was performed showing calcifications in 2 of the core samples. Specimens with calcifications are identified for pathology. The conclusion of the procedure, an X shaped tissue marker clip was deployed into the biopsy cavity. Follow-up 2-view mammogram was performed and dictated separately. IMPRESSION: Stereotactic-guided biopsy of 2 groups of calcifications involving the UPPER OUTER QUADRANT of the LEFT breast. No apparent complications. Electronically Signed: By: Evangeline Dakin M.D. On: 11/29/2018 10:26   Mm Lt Breast Bx W Loc Dev Ea Ad Lesion Img Bx Spec Stereo Guide  Addendum Date: 12/01/2018   ADDENDUM REPORT: 12/01/2018 13:25 ADDENDUM: Pathology revealed HIGH GRADE DUCTAL CARCINOMA IN SITU WITH CALCIFICATIONS of the LEFT breast, upper outer quadrant anterior depth. This was found to be concordant by Dr. Peggye Fothergill. Pathology revealed FIBROADENOMA WITH CALCIFICATIONS of the LEFT breast, upper outer quadrant, posterior depth. This was found to be concordant by Dr. Peggye Fothergill. Pathology results were discussed with the patient by telephone. The patient reported doing well after the biopsies with tenderness at the sites. Post biopsy instructions and care were reviewed and questions were answered. The patient was encouraged to call [facility] for any additional concerns. Surgical consultation has been arranged with Dr. Erroll Luna at [facility] on December 06, 2018 per patient request. Bilateral Breast MRI is recommended to determine extent of disease per Dr. Purcell Nails. Pathology results  reported by Stacie Acres, RN on 12/01/2018. Electronically Signed   By: Evangeline Dakin M.D.   On: 12/01/2018 13:25   Result Date: 12/01/2018 CLINICAL DATA:  73 year old with 2 groups of screening detected calcifications in the UPPER OUTER QUADRANT of the LEFT breast. There is a 2.1 cm group of suspicious calcifications at ANTERIOR depth and there is a 0.4 cm group of indeterminate calcifications at POSTERIOR depth (near the 9 o'clock location). EXAM: LEFT BREAST STEREOTACTIC CORE NEEDLE BIOPSY COMPARISON:  Previous exams. FINDINGS: The patient and I discussed the procedure of stereotactic-guided biopsy including benefits and alternatives. We discussed the high likelihood of a successful procedure. We discussed the risks of the procedure including infection, bleeding, tissue injury, clip migration, and inadequate sampling. Informed written consent was given. The usual time out protocol was performed immediately prior to the procedure. # 1) ANTERIOR group: Lesion quadrant: UPPER OUTER QUADRANT. Initially, using sterile technique with chlorhexidine as skin antisepsis, 1% lidocaine and 1% lidocaine with epinephrine as local anesthetic, under stereotactic guidance, a 9 gauge Brevera vacuum assisted device was used to perform core needle  biopsy of calcifications involving the UPPER OUTER QUADRANT of the LEFT breast at ANTERIOR depth using a SUPERIOR approach. Specimen radiograph was performed showing calcifications in all 4 of the core samples. Specimens with calcifications are identified for pathology. At the conclusion of the procedure, a coil shaped tissue marker clip was deployed into the biopsy cavity. # 2: POSTERIOR group: Lesion quadrant: Slight UPPER OUTER QUADRANT (near 9 o'clock). Using sterile technique with chlorhexidine as skin antisepsis, 1% lidocaine and 1% lidocaine with epinephrine as local anesthetic, under stereotactic guidance, a 9 gauge Brevera vacuum assisted device was used to perform core needle  biopsy of calcifications involving the UPPER OUTER QUADRANT of the LEFT breast at POSTERIOR depth, near 9 o'clock location, using a SUPERIOR approach. Specimen radiograph was performed showing calcifications in 2 of the core samples. Specimens with calcifications are identified for pathology. The conclusion of the procedure, an X shaped tissue marker clip was deployed into the biopsy cavity. Follow-up 2-view mammogram was performed and dictated separately. IMPRESSION: Stereotactic-guided biopsy of 2 groups of calcifications involving the UPPER OUTER QUADRANT of the LEFT breast. No apparent complications. Electronically Signed: By: Evangeline Dakin M.D. On: 11/29/2018 10:26      IMPRESSION: Stage 0 Left Breast UOQ, Ductal Carcinoma In Situ, ER+ / PR+, Grade 3  Patient would be a good candidate for breast conservation therapy with radiation therapy directed at the left breast after her upcoming lumpectomy. I did discuss with the patient that another option for her would be a mastectomy. The advantage being that she could avoid radiation therapy in that situation. The patient would like to keep her breast if at all possible. Given the high grade nature of her tumor, I would not recommend excisional biopsy alone in this situation.  Today, I talked to the patient and husband about the findings and work-up thus far.  We discussed the natural history of breast cancer and general treatment, highlighting the role of radiotherapy in the management.  We discussed the available radiation techniques, and focused on the details of logistics and delivery.  We reviewed the anticipated acute and late sequelae associated with radiation in this setting.  The patient was encouraged to ask questions that I answered to the best of my ability.   The patient would appear to be a good candidate for hypofractionated accelerated radiation therapy.  PLAN: Patient will be scheduled for her surgery in the near future with Dr. Brantley Stage.  She will follow up with me 2 weeks post op.     ------------------------------------------------  Blair Promise, PhD, MD  This document serves as a record of services personally performed by Gery Pray, MD. It was created on his behalf by Wilburn Mylar, a trained medical scribe. The creation of this record is based on the scribe's personal observations and the provider's statements to them. This document has been checked and approved by the attending provider.

## 2018-12-13 ENCOUNTER — Telehealth: Payer: Self-pay | Admitting: Hematology and Oncology

## 2018-12-13 ENCOUNTER — Other Ambulatory Visit: Payer: Self-pay | Admitting: Surgery

## 2018-12-13 DIAGNOSIS — D0512 Intraductal carcinoma in situ of left breast: Secondary | ICD-10-CM

## 2018-12-13 NOTE — Telephone Encounter (Signed)
Spoke with patient re 10/15 new patient appointment with Dr. Lindi Adie. Date/time/[place] per navigator.

## 2018-12-14 DIAGNOSIS — L821 Other seborrheic keratosis: Secondary | ICD-10-CM | POA: Diagnosis not present

## 2018-12-15 NOTE — Progress Notes (Signed)
[facility] CONSULT NOTE  Patient Care Team: Ronita Hipps, MD as PCP - General (Family Medicine)  CHIEF COMPLAINTS/PURPOSE OF CONSULTATION:  Newly diagnosed breast cancer  HISTORY OF PRESENTING ILLNESS:  Andrea [name] 73 y.o. female is here because of recent diagnosis of ductal carcinoma in situ of the left breast. The cancer was detected on a routine screening mammogram. Diagnostic mammogram of the left breast on 11/24/18 showed calcifications spanning 1.8cm in the UOQ, and an additional group of calcifications in the UOQ spanning 0.4cm. Biopsy on 11/29/18 showed ductal carcinoma in situ with calcifications, high grade, ER+ 100%, PR+ 10%. She presents to the clinic today for initial evaluation and discussion of treatment options.   I reviewed her records extensively and collaborated the history with the patient.  SUMMARY OF ONCOLOGIC HISTORY: Oncology History  Ductal carcinoma in situ (DCIS) of left breast  12/09/2018 Initial Diagnosis   Routine screening mammogram detected left breast calcifications spanning 1.8cm in the UOQ, and an additional group of calcifications in the UOQ spanning 0.4cm. Biopsy DCIS with calcifications, high grade, ER+ 100%, PR+ 10%.      MEDICAL HISTORY:  No past medical history on file.  SURGICAL HISTORY: Past Surgical History:  Procedure Laterality Date  . ABDOMINAL HYSTERECTOMY    . INNER EAR SURGERY    . WRIST SURGERY      SOCIAL HISTORY: Social History   Socioeconomic History  . Marital status: Divorced    Spouse name: Not on file  . Number of children: Not on file  . Years of education: Not on file  . Highest education level: Not on file  Occupational History  . Not on file  Social Needs  . Financial resource strain: Not on file  . Food insecurity    Worry: Not on file    Inability: Not on file  . Transportation needs    Medical: Not on file    Non-medical: Not on file  Tobacco Use  . Smoking status: Never Smoker   . Smokeless tobacco: Never Used  Substance and Sexual Activity  . Alcohol use: No  . Drug use: No  . Sexual activity: Not on file  Lifestyle  . Physical activity    Days per week: Not on file    Minutes per session: Not on file  . Stress: Not on file  Relationships  . Social Herbalist on phone: Not on file    Gets together: Not on file    Attends religious service: Not on file    Active member of club or organization: Not on file    Attends meetings of clubs or organizations: Not on file    Relationship status: Not on file  . Intimate partner violence    Fear of current or ex partner: Not on file    Emotionally abused: Not on file    Physically abused: Not on file    Forced sexual activity: Not on file  Other Topics Concern  . Not on file  Social History Narrative  . Not on file    FAMILY HISTORY: Family History  Problem Relation Age of Onset  . Valvular heart disease Brother     ALLERGIES:  is allergic to cortisone.  MEDICATIONS:  Current Outpatient Medications  Medication Sig Dispense Refill  . Ascorbic Acid (VITAMIN C) 1000 MG tablet Take 1,000 mg by mouth daily.     Marland Kitchen aspirin EC 81 MG tablet Take 81 mg by mouth daily.     Marland Kitchen  beta carotene 10000 UNIT capsule Take 10,000 Units by mouth daily.     . Calcium Carb-Cholecalciferol (CALCIUM-VITAMIN D) 500-200 MG-UNIT tablet Take 1 tablet by mouth daily.     . carvedilol (COREG) 6.25 MG tablet TAKE 1/2 TABLET BY MOUTH TWICE DAILY 180 tablet 0  . DOCOSAHEXAENOIC ACID PO Take 1,000 mg by mouth 2 (two) times daily.     . Multiple Vitamins-Minerals (OCUVITE PO) Take 1 tablet by mouth daily.     Marland Kitchen omeprazole (PRILOSEC) 40 MG capsule Take 40 mg by mouth daily.     . Probiotic CAPS Take 1 capsule by mouth daily.     Marland Kitchen venlafaxine XR (EFFEXOR XR) 37.5 MG 24 hr capsule Take 1 capsule (37.5 mg total) by mouth daily with breakfast. 30 capsule 6  . vitamin B-12 (CYANOCOBALAMIN) 1000 MCG tablet Take 1,000 mcg by mouth  daily.     Marland Kitchen zinc gluconate 50 MG tablet Take 50 mg by mouth daily.      No current facility-administered medications for this visit.     REVIEW OF SYSTEMS:   Constitutional: Denies fevers, chills or abnormal night sweats Eyes: Denies blurriness of vision, double vision or watery eyes Ears, nose, mouth, throat, and face: Denies mucositis or sore throat Respiratory: Denies cough, dyspnea or wheezes Cardiovascular: Denies palpitation, chest discomfort or lower extremity swelling Gastrointestinal:  Denies nausea, heartburn or change in bowel habits Skin: Denies abnormal skin rashes Lymphatics: Denies new lymphadenopathy or easy bruising Neurological:Denies numbness, tingling or new weaknesses Behavioral/Psych: Mood is stable, no new changes  Breast: Denies any palpable lumps or discharge All other systems were reviewed with the patient and are negative.  PHYSICAL EXAMINATION: ECOG PERFORMANCE STATUS: 1 - Symptomatic but completely ambulatory  Vitals:   12/16/18 1422  BP: (!) 169/93  Pulse: 96  Resp: 18  Temp: 98.9 F (37.2 C)  SpO2: 99%   Filed Weights   12/16/18 1422  Weight: 132 lb 8 oz (60.1 kg)    GENERAL:alert, no distress and comfortable SKIN: skin color, texture, turgor are normal, no rashes or significant lesions EYES: normal, conjunctiva are pink and non-injected, sclera clear OROPHARYNX:no exudate, no erythema and lips, buccal mucosa, and tongue normal  NECK: supple, thyroid normal size, non-tender, without nodularity LYMPH:  no palpable lymphadenopathy in the cervical, axillary or inguinal LUNGS: clear to auscultation and percussion with normal breathing effort HEART: regular rate & rhythm and no murmurs and no lower extremity edema ABDOMEN:abdomen soft, non-tender and normal bowel sounds Musculoskeletal:no cyanosis of digits and no clubbing  PSYCH: alert & oriented x 3 with fluent speech NEURO: no focal motor/sensory deficits BREAST: No palpable nodules in  breast. No palpable axillary or supraclavicular lymphadenopathy (exam performed in the presence of a chaperone)   LABORATORY DATA:  I have reviewed the data as listed Lab Results  Component Value Date   WBC 4.5 11/03/2018   HGB 13.7 11/03/2018   HCT 42.1 11/03/2018   MCV 96 11/03/2018   PLT 225 11/03/2018   Lab Results  Component Value Date   NA 142 11/03/2018   K 4.4 11/03/2018   CL 103 11/03/2018   CO2 26 11/03/2018    RADIOGRAPHIC STUDIES: I have personally reviewed the radiological reports and agreed with the findings in the report.  ASSESSMENT AND PLAN:  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:Routine screening mammogram detected left breast calcifications spanning 1.8cm in the UOQ, and an additional group of calcifications in the UOQ spanning 0.4cm. Biopsy DCIS  with calcifications, high grade, ER+ 100%, PR+ 10%.   Pathology review: I discussed with the patient the difference between DCIS and invasive breast cancer. It is considered a precancerous lesion. DCIS is classified as a 0. It is generally detected through mammograms as calcifications. We discussed the significance of grades and its impact on prognosis. We also discussed the importance of ER and PR receptors and their implications to adjuvant treatment options. Prognosis of DCIS dependence on grade, comedo necrosis. It is anticipated that if not treated, 20-30% of DCIS can develop into invasive breast cancer.  Recommendation: 1. Breast conserving surgery 2. Followed by adjuvant radiation therapy 3. Followed by antiestrogen therapy with tamoxifen 5 years  Tamoxifen counseling: We discussed the risks and benefits of tamoxifen. These include but not limited to insomnia, hot flashes, mood changes, vaginal dryness, and weight gain. Although rare, serious side effects including endometrial cancer, risk of blood clots were also discussed. We strongly believe that the benefits far outweigh the risks. Patient  understands these risks and consented to starting treatment. Planned treatment duration is 5 years.  Patient has been married to her current husband for the past 4 years.  However they both live in their separate houses. She needs her husband to help her with medical decisions because she has profound hearing impairment.  Return to clinic for telephone call after surgery to discuss the final pathology report and come up with an adjuvant treatment plan.  All questions were answered. The patient knows to call the clinic with any problems, questions or concerns.   Rulon Eisenmenger, MD, MPH 12/16/2018    I, Molly Dorshimer, am acting as scribe for Nicholas Lose, MD.  I have reviewed the above documentation for accuracy and completeness, and I agree with the above.

## 2018-12-16 ENCOUNTER — Inpatient Hospital Stay: Payer: Medicare Other | Attending: Hematology and Oncology | Admitting: Hematology and Oncology

## 2018-12-16 ENCOUNTER — Other Ambulatory Visit: Payer: Self-pay

## 2018-12-16 ENCOUNTER — Encounter: Payer: Self-pay | Admitting: *Deleted

## 2018-12-16 DIAGNOSIS — I2510 Atherosclerotic heart disease of native coronary artery without angina pectoris: Secondary | ICD-10-CM | POA: Diagnosis not present

## 2018-12-16 DIAGNOSIS — Z7982 Long term (current) use of aspirin: Secondary | ICD-10-CM | POA: Diagnosis not present

## 2018-12-16 DIAGNOSIS — Z170 Estrogen receptor positive status [ER+]: Secondary | ICD-10-CM | POA: Diagnosis not present

## 2018-12-16 DIAGNOSIS — D0512 Intraductal carcinoma in situ of left breast: Secondary | ICD-10-CM | POA: Insufficient documentation

## 2018-12-16 DIAGNOSIS — Z79899 Other long term (current) drug therapy: Secondary | ICD-10-CM | POA: Insufficient documentation

## 2018-12-16 MED ORDER — VENLAFAXINE HCL ER 37.5 MG PO CP24: 37.5000 mg | ORAL_CAPSULE | Freq: Every day | ORAL | 6 refills | Status: DC

## 2018-12-16 NOTE — Assessment & Plan Note (Signed)
12/09/2018:Routine screening mammogram detected left breast calcifications spanning 1.8cm in the UOQ, and an additional group of calcifications in the UOQ spanning 0.4cm. Biopsy DCIS with calcifications, high grade, ER+ 100%, PR+ 10%.   Pathology review: I discussed with the patient the difference between DCIS and invasive breast cancer. It is considered a precancerous lesion. DCIS is classified as a 0. It is generally detected through mammograms as calcifications. We discussed the significance of grades and its impact on prognosis. We also discussed the importance of ER and PR receptors and their implications to adjuvant treatment options. Prognosis of DCIS dependence on grade, comedo necrosis. It is anticipated that if not treated, 20-30% of DCIS can develop into invasive breast cancer.  Recommendation: 1. Breast conserving surgery 2. Followed by adjuvant radiation therapy 3. Followed by antiestrogen therapy with tamoxifen 5 years  Tamoxifen counseling: We discussed the risks and benefits of tamoxifen. These include but not limited to insomnia, hot flashes, mood changes, vaginal dryness, and weight gain. Although rare, serious side effects including endometrial cancer, risk of blood clots were also discussed. We strongly believe that the benefits far outweigh the risks. Patient understands these risks and consented to starting treatment. Planned treatment duration is 5 years.  Return to clinic after surgery to discuss the final pathology report and come up with an adjuvant treatment plan.

## 2018-12-17 ENCOUNTER — Telehealth: Payer: Self-pay | Admitting: Hematology and Oncology

## 2018-12-17 NOTE — Telephone Encounter (Signed)
I left a message regarding schedule for 11/10

## 2018-12-28 ENCOUNTER — Other Ambulatory Visit: Payer: Self-pay

## 2018-12-28 ENCOUNTER — Encounter (HOSPITAL_BASED_OUTPATIENT_CLINIC_OR_DEPARTMENT_OTHER): Payer: Self-pay | Admitting: *Deleted

## 2018-12-28 DIAGNOSIS — L821 Other seborrheic keratosis: Secondary | ICD-10-CM | POA: Diagnosis not present

## 2018-12-28 DIAGNOSIS — L820 Inflamed seborrheic keratosis: Secondary | ICD-10-CM | POA: Diagnosis not present

## 2018-12-29 DIAGNOSIS — C441122 Basal cell carcinoma of skin of right lower eyelid, including canthus: Secondary | ICD-10-CM | POA: Diagnosis not present

## 2018-12-31 ENCOUNTER — Other Ambulatory Visit: Payer: Self-pay

## 2018-12-31 ENCOUNTER — Encounter (HOSPITAL_BASED_OUTPATIENT_CLINIC_OR_DEPARTMENT_OTHER)
Admission: RE | Admit: 2018-12-31 | Discharge: 2018-12-31 | Disposition: A | Discharge status: Home / Self Care | Payer: Medicare Other | Source: Ambulatory Visit | Attending: Surgery | Admitting: Surgery

## 2018-12-31 ENCOUNTER — Other Ambulatory Visit (HOSPITAL_COMMUNITY)
Admission: RE | Admit: 2018-12-31 | Discharge: 2018-12-31 | Disposition: A | Discharge status: Home / Self Care | Payer: Medicare Other | Source: Ambulatory Visit | Attending: Surgery | Admitting: Surgery

## 2018-12-31 DIAGNOSIS — D0512 Intraductal carcinoma in situ of left breast: Secondary | ICD-10-CM | POA: Diagnosis not present

## 2018-12-31 DIAGNOSIS — Z20828 Contact with and (suspected) exposure to other viral communicable diseases: Secondary | ICD-10-CM | POA: Insufficient documentation

## 2018-12-31 DIAGNOSIS — Z01812 Encounter for preprocedural laboratory examination: Secondary | ICD-10-CM | POA: Insufficient documentation

## 2018-12-31 LAB — CBC WITH DIFFERENTIAL/PLATELET
Abs Immature Granulocytes: 0.01 10*3/uL (ref 0.00–0.07)
Basophils Absolute: 0 10*3/uL (ref 0.0–0.1)
Basophils Relative: 1 %
Eosinophils Absolute: 0.1 10*3/uL (ref 0.0–0.5)
Eosinophils Relative: 3 %
HCT: 42 % (ref 36.0–46.0)
Hemoglobin: 13.7 g/dL (ref 12.0–15.0)
Immature Granulocytes: 0 %
Lymphocytes Relative: 30 %
Lymphs Abs: 1.3 10*3/uL (ref 0.7–4.0)
MCH: 31.2 pg (ref 26.0–34.0)
MCHC: 32.6 g/dL (ref 30.0–36.0)
MCV: 95.7 fL (ref 80.0–100.0)
Monocytes Absolute: 0.5 10*3/uL (ref 0.1–1.0)
Monocytes Relative: 11 %
Neutro Abs: 2.2 10*3/uL (ref 1.7–7.7)
Neutrophils Relative %: 55 %
Platelets: 224 10*3/uL (ref 150–400)
RBC: 4.39 MIL/uL (ref 3.87–5.11)
RDW: 12.7 % (ref 11.5–15.5)
WBC: 4.1 10*3/uL (ref 4.0–10.5)
nRBC: 0 % (ref 0.0–0.2)

## 2018-12-31 LAB — COMPREHENSIVE METABOLIC PANEL
ALT: 16 U/L (ref 0–44)
AST: 20 U/L (ref 15–41)
Albumin: 3.8 g/dL (ref 3.5–5.0)
Alkaline Phosphatase: 49 U/L (ref 38–126)
Anion gap: 8 (ref 5–15)
BUN: 14 mg/dL (ref 8–23)
CO2: 27 mmol/L (ref 22–32)
Calcium: 9.5 mg/dL (ref 8.9–10.3)
Chloride: 105 mmol/L (ref 98–111)
Creatinine, Ser: 0.8 mg/dL (ref 0.44–1.00)
GFR calc Af Amer: >60 mL/min (ref >60)
GFR calc non Af Amer: >60 mL/min (ref >60)
Glucose, Bld: 116 mg/dL — ABNORMAL HIGH (ref 70–99)
Potassium: 4.4 mmol/L (ref 3.5–5.1)
Sodium: 140 mmol/L (ref 135–145)
Total Bilirubin: 0.6 mg/dL (ref 0.3–1.2)
Total Protein: 6.8 g/dL (ref 6.5–8.1)

## 2018-12-31 NOTE — Progress Notes (Signed)

## 2019-01-01 LAB — NOVEL CORONAVIRUS, NAA (HOSP ORDER, SEND-OUT TO REF LAB; TAT 18-24 HRS): SARS-CoV-2, NAA: NOT DETECTED

## 2019-01-03 ENCOUNTER — Ambulatory Visit
Admission: RE | Admit: 2019-01-03 | Discharge: 2019-01-03 | Disposition: A | Discharge status: Home / Self Care | Payer: Medicare Other | Source: Ambulatory Visit | Attending: Surgery | Admitting: Surgery

## 2019-01-03 ENCOUNTER — Other Ambulatory Visit: Payer: Self-pay

## 2019-01-03 DIAGNOSIS — C50912 Malignant neoplasm of unspecified site of left female breast: Secondary | ICD-10-CM | POA: Diagnosis not present

## 2019-01-03 DIAGNOSIS — D0512 Intraductal carcinoma in situ of left breast: Secondary | ICD-10-CM

## 2019-01-03 NOTE — Anesthesia Preprocedure Evaluation (Addendum)
Anesthesia Evaluation  Patient identified by MRN, date of birth, ID band Patient awake    Reviewed: Allergy & Precautions, NPO status , Patient's Chart, lab work & pertinent test results  Airway Mallampati: II  TM Distance: >3 FB Neck ROM: Full    Dental no notable dental hx.    Pulmonary neg pulmonary ROS,    Pulmonary exam normal breath sounds clear to auscultation       Cardiovascular hypertension, Pt. on home beta blockers + CAD and +CHF  Normal cardiovascular exam Rhythm:Regular Rate:Normal     Neuro/Psych  Headaches, PSYCHIATRIC DISORDERS    GI/Hepatic Neg liver ROS, GERD  Medicated and Controlled,  Endo/Other  negative endocrine ROS  Renal/GU negative Renal ROS     Musculoskeletal negative musculoskeletal ROS (+)   Abdominal   Peds  Hematology negative hematology ROS (+)   Anesthesia Other Findings LEFT DCIS  Reproductive/Obstetrics                            Anesthesia Physical Anesthesia Plan  ASA: II  Anesthesia Plan: General   Post-op Pain Management:    Induction: Intravenous  PONV Risk Score and Plan: 3 and Ondansetron, Dexamethasone and Treatment may vary due to age or medical condition  Airway Management Planned: LMA  Additional Equipment:   Intra-op Plan:   Post-operative Plan: Extubation in OR  Informed Consent: I have reviewed the patients History and Physical, chart, labs and discussed the procedure including the risks, benefits and alternatives for the proposed anesthesia with the patient or authorized representative who has indicated his/her understanding and acceptance.     Dental advisory given  Plan Discussed with: CRNA  Anesthesia Plan Comments:         Anesthesia Quick Evaluation

## 2019-01-04 ENCOUNTER — Ambulatory Visit (HOSPITAL_BASED_OUTPATIENT_CLINIC_OR_DEPARTMENT_OTHER): Payer: Medicare Other | Admitting: Anesthesiology

## 2019-01-04 ENCOUNTER — Encounter (HOSPITAL_BASED_OUTPATIENT_CLINIC_OR_DEPARTMENT_OTHER)
Admission: RE | Disposition: A | Discharge status: Home / Self Care | Payer: Self-pay | Source: Home / Self Care | Attending: Surgery

## 2019-01-04 ENCOUNTER — Encounter (HOSPITAL_BASED_OUTPATIENT_CLINIC_OR_DEPARTMENT_OTHER): Payer: Self-pay | Admitting: *Deleted

## 2019-01-04 ENCOUNTER — Ambulatory Visit
Admission: RE | Admit: 2019-01-04 | Discharge: 2019-01-04 | Disposition: A | Discharge status: Home / Self Care | Payer: Medicare Other | Source: Ambulatory Visit | Attending: Surgery | Admitting: Surgery

## 2019-01-04 ENCOUNTER — Ambulatory Visit (HOSPITAL_BASED_OUTPATIENT_CLINIC_OR_DEPARTMENT_OTHER)
Admission: RE | Admit: 2019-01-04 | Discharge: 2019-01-04 | Disposition: A | Discharge status: Home / Self Care | Payer: Medicare Other | Attending: Surgery | Admitting: Surgery

## 2019-01-04 DIAGNOSIS — I2510 Atherosclerotic heart disease of native coronary artery without angina pectoris: Secondary | ICD-10-CM | POA: Insufficient documentation

## 2019-01-04 DIAGNOSIS — I110 Hypertensive heart disease with heart failure: Secondary | ICD-10-CM | POA: Insufficient documentation

## 2019-01-04 DIAGNOSIS — Z888 Allergy status to other drugs, medicaments and biological substances status: Secondary | ICD-10-CM | POA: Insufficient documentation

## 2019-01-04 DIAGNOSIS — I509 Heart failure, unspecified: Secondary | ICD-10-CM | POA: Diagnosis not present

## 2019-01-04 DIAGNOSIS — Z170 Estrogen receptor positive status [ER+]: Secondary | ICD-10-CM | POA: Diagnosis not present

## 2019-01-04 DIAGNOSIS — K219 Gastro-esophageal reflux disease without esophagitis: Secondary | ICD-10-CM | POA: Diagnosis not present

## 2019-01-04 DIAGNOSIS — C50412 Malignant neoplasm of upper-outer quadrant of left female breast: Secondary | ICD-10-CM | POA: Diagnosis not present

## 2019-01-04 DIAGNOSIS — Z79899 Other long term (current) drug therapy: Secondary | ICD-10-CM | POA: Diagnosis not present

## 2019-01-04 DIAGNOSIS — D0512 Intraductal carcinoma in situ of left breast: Secondary | ICD-10-CM

## 2019-01-04 DIAGNOSIS — Z7982 Long term (current) use of aspirin: Secondary | ICD-10-CM | POA: Insufficient documentation

## 2019-01-04 DIAGNOSIS — N6489 Other specified disorders of breast: Secondary | ICD-10-CM | POA: Diagnosis not present

## 2019-01-04 DIAGNOSIS — C50912 Malignant neoplasm of unspecified site of left female breast: Secondary | ICD-10-CM | POA: Diagnosis not present

## 2019-01-04 HISTORY — DX: Unspecified hearing loss, unspecified ear: H91.90

## 2019-01-04 HISTORY — DX: Gastro-esophageal reflux disease without esophagitis: K21.9

## 2019-01-04 HISTORY — DX: Headache, unspecified: R51.9

## 2019-01-04 HISTORY — DX: Palpitations: R00.2

## 2019-01-04 HISTORY — DX: Cardiomyopathy, unspecified: I42.9

## 2019-01-04 HISTORY — PX: BREAST LUMPECTOMY WITH RADIOACTIVE SEED LOCALIZATION: SHX6424

## 2019-01-04 HISTORY — DX: Hyperlipidemia, unspecified: E78.5

## 2019-01-04 HISTORY — DX: Other complications of anesthesia, initial encounter: T88.59XA

## 2019-01-04 HISTORY — DX: Essential (primary) hypertension: I10

## 2019-01-04 SURGERY — BREAST LUMPECTOMY WITH RADIOACTIVE SEED LOCALIZATION
Anesthesia: General | Site: Breast | Laterality: Left

## 2019-01-04 MED ORDER — ONDANSETRON HCL 4 MG/2ML IJ SOLN
INTRAMUSCULAR | Status: DC | PRN
  Administered 2019-01-04: 4 mg via INTRAVENOUS

## 2019-01-04 MED ORDER — HYDROCODONE-ACETAMINOPHEN 5-325 MG PO TABS: 1.0000 | ORAL_TABLET | Freq: Four times a day (QID) | ORAL | 0 refills | Status: DC | PRN

## 2019-01-04 MED ORDER — CHLORHEXIDINE GLUCONATE CLOTH 2 % EX PADS: 6.0000 | MEDICATED_PAD | Freq: Once | CUTANEOUS | Status: DC

## 2019-01-04 MED ORDER — EPHEDRINE SULFATE 50 MG/ML IJ SOLN
INTRAMUSCULAR | Status: DC | PRN
  Administered 2019-01-04: 10 mg via INTRAVENOUS

## 2019-01-04 MED ORDER — MIDAZOLAM HCL 2 MG/2ML IJ SOLN
INTRAMUSCULAR | Status: AC
  Filled 2019-01-04: qty 2

## 2019-01-04 MED ORDER — EPHEDRINE 5 MG/ML INJ
INTRAVENOUS | Status: AC
  Filled 2019-01-04: qty 10

## 2019-01-04 MED ORDER — GABAPENTIN 300 MG PO CAPS
300.0000 mg | ORAL_CAPSULE | ORAL | Status: AC
  Administered 2019-01-04: 300 mg via ORAL

## 2019-01-04 MED ORDER — ACETAMINOPHEN 500 MG PO TABS
1000.0000 mg | ORAL_TABLET | ORAL | Status: AC
  Administered 2019-01-04: 1000 mg via ORAL

## 2019-01-04 MED ORDER — LACTATED RINGERS IV SOLN
INTRAVENOUS | Status: DC
  Administered 2019-01-04: 07:00:00 via INTRAVENOUS

## 2019-01-04 MED ORDER — PROPOFOL 500 MG/50ML IV EMUL
INTRAVENOUS | Status: AC
  Filled 2019-01-04: qty 50

## 2019-01-04 MED ORDER — FENTANYL CITRATE (PF) 100 MCG/2ML IJ SOLN
INTRAMUSCULAR | Status: AC
  Filled 2019-01-04: qty 2

## 2019-01-04 MED ORDER — LIDOCAINE HCL (CARDIAC) PF 100 MG/5ML IV SOSY
PREFILLED_SYRINGE | INTRAVENOUS | Status: DC | PRN
  Administered 2019-01-04: 60 mg via INTRAVENOUS

## 2019-01-04 MED ORDER — MIDAZOLAM HCL 2 MG/2ML IJ SOLN: 1.0000 mg | INTRAMUSCULAR | Status: DC | PRN

## 2019-01-04 MED ORDER — BUPIVACAINE HCL (PF) 0.25 % IJ SOLN
INTRAMUSCULAR | Status: DC | PRN
  Administered 2019-01-04: 20 mL

## 2019-01-04 MED ORDER — PHENYLEPHRINE 40 MCG/ML (10ML) SYRINGE FOR IV PUSH (FOR BLOOD PRESSURE SUPPORT)
PREFILLED_SYRINGE | INTRAVENOUS | Status: AC
  Filled 2019-01-04: qty 10

## 2019-01-04 MED ORDER — SUCCINYLCHOLINE CHLORIDE 200 MG/10ML IV SOSY
PREFILLED_SYRINGE | INTRAVENOUS | Status: AC
  Filled 2019-01-04: qty 10

## 2019-01-04 MED ORDER — FENTANYL CITRATE (PF) 100 MCG/2ML IJ SOLN: 25.0000 ug | INTRAMUSCULAR | Status: DC | PRN

## 2019-01-04 MED ORDER — ONDANSETRON HCL 4 MG/2ML IJ SOLN
INTRAMUSCULAR | Status: AC
  Filled 2019-01-04: qty 2

## 2019-01-04 MED ORDER — CEFAZOLIN SODIUM-DEXTROSE 2-4 GM/100ML-% IV SOLN
INTRAVENOUS | Status: AC
  Filled 2019-01-04: qty 100

## 2019-01-04 MED ORDER — DEXTROSE 5 % IV SOLN
3.0000 g | INTRAVENOUS | Status: AC
  Administered 2019-01-04: 2 g via INTRAVENOUS

## 2019-01-04 MED ORDER — ONDANSETRON HCL 4 MG/2ML IJ SOLN: 4.0000 mg | Freq: Once | INTRAMUSCULAR | Status: DC | PRN

## 2019-01-04 MED ORDER — ACETAMINOPHEN 500 MG PO TABS
ORAL_TABLET | ORAL | Status: AC
  Filled 2019-01-04: qty 2

## 2019-01-04 MED ORDER — LIDOCAINE 2% (20 MG/ML) 5 ML SYRINGE
INTRAMUSCULAR | Status: AC
  Filled 2019-01-04: qty 5

## 2019-01-04 MED ORDER — DEXAMETHASONE SODIUM PHOSPHATE 10 MG/ML IJ SOLN
INTRAMUSCULAR | Status: AC
  Filled 2019-01-04: qty 1

## 2019-01-04 MED ORDER — GABAPENTIN 300 MG PO CAPS
ORAL_CAPSULE | ORAL | Status: AC
  Filled 2019-01-04: qty 1

## 2019-01-04 MED ORDER — FENTANYL CITRATE (PF) 100 MCG/2ML IJ SOLN
50.0000 ug | INTRAMUSCULAR | Status: DC | PRN
  Administered 2019-01-04: 25 ug via INTRAVENOUS

## 2019-01-04 MED ORDER — PROPOFOL 10 MG/ML IV BOLUS
INTRAVENOUS | Status: DC | PRN
  Administered 2019-01-04: 150 mg via INTRAVENOUS

## 2019-01-04 SURGICAL SUPPLY — 55 items
ADH SKN CLS APL DERMABOND .7 (GAUZE/BANDAGES/DRESSINGS) ×1
APL PRP STRL LF DISP 70% ISPRP (MISCELLANEOUS) ×1
APPLIER CLIP 9.375 MED OPEN (MISCELLANEOUS)
APR CLP MED 9.3 20 MLT OPN (MISCELLANEOUS)
BINDER BREAST LRG (GAUZE/BANDAGES/DRESSINGS) ×2 IMPLANT
BINDER BREAST MEDIUM (GAUZE/BANDAGES/DRESSINGS) IMPLANT
BINDER BREAST XLRG (GAUZE/BANDAGES/DRESSINGS) IMPLANT
BINDER BREAST XXLRG (GAUZE/BANDAGES/DRESSINGS) IMPLANT
BLADE SURG 15 STRL LF DISP TIS (BLADE) ×1 IMPLANT
BLADE SURG 15 STRL SS (BLADE) ×3
CANISTER SUC SOCK COL 7IN (MISCELLANEOUS) IMPLANT
CANISTER SUCT 1200ML W/VALVE (MISCELLANEOUS) IMPLANT
CHLORAPREP W/TINT 26 (MISCELLANEOUS) ×3 IMPLANT
CLIP APPLIE 9.375 MED OPEN (MISCELLANEOUS) IMPLANT
COVER BACK TABLE REUSABLE LG (DRAPES) ×3 IMPLANT
COVER MAYO STAND REUSABLE (DRAPES) ×3 IMPLANT
COVER PROBE W GEL 5X96 (DRAPES) ×3 IMPLANT
COVER WAND RF STERILE (DRAPES) IMPLANT
DECANTER SPIKE VIAL GLASS SM (MISCELLANEOUS) IMPLANT
DERMABOND ADVANCED (GAUZE/BANDAGES/DRESSINGS) ×2
DERMABOND ADVANCED .7 DNX12 (GAUZE/BANDAGES/DRESSINGS) ×1 IMPLANT
DRAPE LAPAROSCOPIC ABDOMINAL (DRAPES) ×2 IMPLANT
DRAPE LAPAROTOMY 100X72 PEDS (DRAPES) ×1 IMPLANT
DRAPE UTILITY XL STRL (DRAPES) ×3 IMPLANT
ELECT COATED BLADE 2.86 ST (ELECTRODE) ×3 IMPLANT
ELECT REM PT RETURN 9FT ADLT (ELECTROSURGICAL) ×3
ELECTRODE REM PT RTRN 9FT ADLT (ELECTROSURGICAL) ×1 IMPLANT
GLOVE BIO SURGEON STRL SZ 6.5 (GLOVE) ×1 IMPLANT
GLOVE BIO SURGEONS STRL SZ 6.5 (GLOVE) ×1
GLOVE BIOGEL PI IND STRL 7.0 (GLOVE) IMPLANT
GLOVE BIOGEL PI IND STRL 8 (GLOVE) ×1 IMPLANT
GLOVE BIOGEL PI INDICATOR 7.0 (GLOVE) ×2
GLOVE BIOGEL PI INDICATOR 8 (GLOVE) ×2
GLOVE ECLIPSE 8.0 STRL XLNG CF (GLOVE) ×3 IMPLANT
GOWN STRL REUS W/ TWL LRG LVL3 (GOWN DISPOSABLE) ×2 IMPLANT
GOWN STRL REUS W/TWL LRG LVL3 (GOWN DISPOSABLE) ×6
HEMOSTAT ARISTA ABSORB 3G PWDR (HEMOSTASIS) IMPLANT
HEMOSTAT SNOW SURGICEL 2X4 (HEMOSTASIS) IMPLANT
KIT MARKER MARGIN INK (KITS) ×3 IMPLANT
NDL HYPO 25X1 1.5 SAFETY (NEEDLE) ×1 IMPLANT
NEEDLE HYPO 25X1 1.5 SAFETY (NEEDLE) ×3 IMPLANT
NS IRRIG 1000ML POUR BTL (IV SOLUTION) ×3 IMPLANT
PACK BASIN DAY SURGERY FS (CUSTOM PROCEDURE TRAY) ×3 IMPLANT
PENCIL BUTTON HOLSTER BLD 10FT (ELECTRODE) ×3 IMPLANT
SLEEVE SCD COMPRESS KNEE MED (MISCELLANEOUS) ×3 IMPLANT
SPONGE LAP 4X18 RFD (DISPOSABLE) ×3 IMPLANT
SUT MNCRL AB 4-0 PS2 18 (SUTURE) ×3 IMPLANT
SUT SILK 2 0 SH (SUTURE) IMPLANT
SUT VICRYL 3-0 CR8 SH (SUTURE) ×3 IMPLANT
SYR CONTROL 10ML LL (SYRINGE) ×3 IMPLANT
TOWEL GREEN STERILE FF (TOWEL DISPOSABLE) ×3 IMPLANT
TRAY FAXITRON CT DISP (TRAY / TRAY PROCEDURE) ×3 IMPLANT
TUBE CONNECTING 20'X1/4 (TUBING)
TUBE CONNECTING 20X1/4 (TUBING) IMPLANT
YANKAUER SUCT BULB TIP NO VENT (SUCTIONS) IMPLANT

## 2019-01-04 NOTE — Anesthesia Postprocedure Evaluation (Signed)
Anesthesia Post Note  Patient: Andrea [name]  Procedure(s) Performed: LEFT BREAST LUMPECTOMY WITH RADIOACTIVE SEED LOCALIZATION (Left Breast)     Patient location during evaluation: PACU Anesthesia Type: General Level of consciousness: awake and alert Pain management: pain level controlled Vital Signs Assessment: post-procedure vital signs reviewed and stable Respiratory status: spontaneous breathing, nonlabored ventilation, respiratory function stable and patient connected to nasal cannula oxygen Cardiovascular status: blood pressure returned to baseline and stable Postop Assessment: no apparent nausea or vomiting Anesthetic complications: no    Last Vitals:  Vitals:   01/04/19 0900 01/04/19 0926  BP: 137/85 (!) 141/90  Pulse: 88 81  Resp: (!) 22 18  Temp:  36.5 C  SpO2: 100% 100%    Last Pain:  Vitals:   01/04/19 0926  TempSrc:   PainSc: 3                  Jaylee Freeze P Brannon Decaire

## 2019-01-04 NOTE — Transfer of Care (Signed)
Immediate Anesthesia Transfer of Care Note  Patient: Andrea [name]  Procedure(s) Performed: LEFT BREAST LUMPECTOMY WITH RADIOACTIVE SEED LOCALIZATION (Left Breast)  Patient Location: PACU  Anesthesia Type:General  Level of Consciousness: sedated  Airway & Oxygen Therapy: Patient Spontanous Breathing and Patient connected to face mask oxygen  Post-op Assessment: Report given to RN and Post -op Vital signs reviewed and stable  Post vital signs: Reviewed and stable  Last Vitals:  Vitals Value Taken Time  BP    Temp    Pulse 101 01/04/19 0824  Resp    SpO2 100 % 01/04/19 0824  Vitals shown include unvalidated device data.  Last Pain:  Vitals:   01/04/19 0657  TempSrc: Oral  PainSc: 0-No pain         Complications: No apparent anesthesia complications

## 2019-01-04 NOTE — Op Note (Signed)
Preoperative diagnosis: Left breast DCIS left upper outer quadrant  Postop diagnosis: Same  Procedure: Left breast seed localized lumpectomy  Surgeon: Erroll Luna, MD  Anesthesia: LMA with local consisting of 0.25% Sensorcaine with epinephrine  EBL: Minimal  Specimen: Left breast tissue with seed and clip verified by Faxitron  Drains: None  IV fluids: Per anesthesia record  Indications for procedure: The patient presents for left breast lumpectomy for a 1.8 cm focus of left breast DCIS upper outer quadrant.  Risk, benefits and other treatment options discussed as well as adjuvant therapy.  Mastectomy reconstruction discussed as well.  She opted of breast conservation.The procedure has been discussed with the patient. Alternatives to surgery have been discussed with the patient.  Risks of surgery include bleeding,  Infection,  Seroma formation, death,  and the need for further surgery.   The patient understands and wishes to proceed.   Description of procedure: The patient was met in the holding area.  The left side was marked as the correct side and the neoprobe was used to verify seed location.  She was taken back to the operative room.  She is placed supine upon the OR table.  After induction of general anesthesia, left breast was prepped and draped in a sterile fashion.  Timeout was performed.  She received appropriate preoperative antibiotics.  Neoprobe was used to localize the seed in the left upper outer quadrant.  Curvilinear incision was made along the superior border of the nipple areolar complex.  Dissection was carried up into the right upper quadrant using the help of the neoprobe.  All tissue around the seed and clip were excised with a grossly negative margin.  Image revealed both seed and clip to be in the specimen along with the calcifications.  The cavity was irrigated.  Hemostasis achieved with cautery.  Wound closed with a deep layer 3-0 Vicryl subcu and 4 Monocryl  subcuticular stitch.  Dermabond applied.  All final counts found to be correct.  Breast binder placed.  The patient was awoke extubated taken recovery in satisfactory condition.

## 2019-01-04 NOTE — Interval H&P Note (Signed)
History and Physical Interval Note:  01/04/2019 7:13 AM  Andrea [name]  has presented today for surgery, with the diagnosis of LEFT DCIS.  The various methods of treatment have been discussed with the patient and family. After consideration of risks, benefits and other options for treatment, the patient has consented to  Procedure(s): LEFT BREAST LUMPECTOMY WITH RADIOACTIVE SEED LOCALIZATION (Left) as a surgical intervention.  The patient's history has been reviewed, patient examined, no change in status, stable for surgery.  I have reviewed the patient's chart and labs.  Questions were answered to the patient's satisfaction.     [place]

## 2019-01-04 NOTE — Anesthesia Procedure Notes (Signed)
Procedure Name: LMA Insertion Date/Time: 01/04/2019 7:39 AM Performed by: Willa Frater, CRNA Pre-anesthesia Checklist: Patient identified, Emergency Drugs available, Suction available and Patient being monitored Patient Re-evaluated:Patient Re-evaluated prior to induction Oxygen Delivery Method: Circle system utilized Preoxygenation: Pre-oxygenation with 100% oxygen Induction Type: IV induction Ventilation: Mask ventilation without difficulty LMA: LMA inserted LMA Size: 4.0 Number of attempts: 1 Airway Equipment and Method: Bite block Placement Confirmation: positive ETCO2 Tube secured with: Tape Dental Injury: Teeth and Oropharynx as per pre-operative assessment

## 2019-01-04 NOTE — Discharge Instructions (Signed)
[place] Office Phone Number [phone]  BREAST BIOPSY/ PARTIAL MASTECTOMY: POST OP INSTRUCTIONS  Always review your discharge instruction sheet given to you by the facility where your surgery was performed.  IF YOU HAVE DISABILITY OR FAMILY LEAVE FORMS, YOU MUST BRING THEM TO THE OFFICE FOR PROCESSING.  DO NOT GIVE THEM TO YOUR DOCTOR.  1. A prescription for pain medication may be given to you upon discharge.  Take your pain medication as prescribed, if needed.  If narcotic pain medicine is not needed, then you may take acetaminophen (Tylenol) or ibuprofen (Advil) as needed. No Tylenol until 1:05pm 2. Take your usually prescribed medications unless otherwise directed 3. If you need a refill on your pain medication, please contact your pharmacy.  They will contact our office to request authorization.  Prescriptions will not be filled after 5pm or on week-ends. 4. You should eat very light the first 24 hours after surgery, such as soup, crackers, pudding, etc.  Resume your normal diet the day after surgery. 5. Most patients will experience some swelling and bruising in the breast.  Ice packs and a good support bra will help.  Swelling and bruising can take several days to resolve.  6. It is common to experience some constipation if taking pain medication after surgery.  Increasing fluid intake and taking a stool softener will usually help or prevent this problem from occurring.  A mild laxative (Milk of Magnesia or Miralax) should be taken according to package directions if there are no bowel movements after 48 hours. 7. Unless discharge instructions indicate otherwise, you may remove your bandages 24-48 hours after surgery, and you may shower at that time.  You may have steri-strips (small skin tapes) in place directly over the incision.  These strips should be left on the skin for 7-10 days.  If your surgeon used skin glue on the incision, you may shower in 24 hours.  The glue  will flake off over the next 2-3 weeks.  Any sutures or staples will be removed at the office during your follow-up visit. 8. ACTIVITIES:  You may resume regular daily activities (gradually increasing) beginning the next day.  Wearing a good support bra or sports bra minimizes pain and swelling.  You may have sexual intercourse when it is comfortable. a. You may drive when you no longer are taking prescription pain medication, you can comfortably wear a seatbelt, and you can safely maneuver your car and apply brakes. b. RETURN TO WORK:  ______________________________________________________________________________________ 9. You should see your doctor in the office for a follow-up appointment approximately two weeks after your surgery.  Your doctors nurse will typically make your follow-up appointment when she calls you with your pathology report.  Expect your pathology report 2-3 business days after your surgery.  You may call to check if you do not hear from [place] after three days. 10. OTHER INSTRUCTIONS: _______________________________________________________________________________________________ _____________________________________________________________________________________________________________________________________ _____________________________________________________________________________________________________________________________________ _____________________________________________________________________________________________________________________________________  WHEN TO CALL YOUR DOCTOR: 1. Fever over 101.0 2. Nausea and/or vomiting. 3. Extreme swelling or bruising. 4. Continued bleeding from incision. 5. Increased pain, redness, or drainage from the incision.  The clinic staff is available to answer your questions during regular business hours.  Please dont hesitate to call and ask to speak to one of the nurses for clinical concerns.  If you have a medical  emergency, go to the nearest emergency room or call 911.  A surgeon from [facility] is always on call at the hospital.  For further questions,  please visit [email or web address]     Post Anesthesia Home Care Instructions  Activity: Get plenty of rest for the remainder of the day. A responsible individual must stay with you for 24 hours following the procedure.  For the next 24 hours, DO NOT: -Drive a car -Paediatric nurse -Drink alcoholic beverages -Take any medication unless instructed by your physician -Make any legal decisions or sign important papers.  Meals: Start with liquid foods such as gelatin or soup. Progress to regular foods as tolerated. Avoid greasy, spicy, heavy foods. If nausea and/or vomiting occur, drink only clear liquids until the nausea and/or vomiting subsides. Call your physician if vomiting continues.  Special Instructions/Symptoms: Your throat may feel dry or sore from the anesthesia or the breathing tube placed in your throat during surgery. If this causes discomfort, gargle with warm salt water. The discomfort should disappear within 24 hours.  If you had a scopolamine patch placed behind your ear for the management of post- operative nausea and/or vomiting:  1. The medication in the patch is effective for 72 hours, after which it should be removed.  Wrap patch in a tissue and discard in the trash. Wash hands thoroughly with soap and water. 2. You may remove the patch earlier than 72 hours if you experience unpleasant side effects which may include dry mouth, dizziness or visual disturbances. 3. Avoid touching the patch. Wash your hands with soap and water after contact with the patch.

## 2019-01-05 ENCOUNTER — Encounter (HOSPITAL_BASED_OUTPATIENT_CLINIC_OR_DEPARTMENT_OTHER): Payer: Self-pay | Admitting: Surgery

## 2019-01-05 ENCOUNTER — Ambulatory Visit: Payer: Self-pay | Admitting: Surgery

## 2019-01-05 LAB — SURGICAL PATHOLOGY

## 2019-01-10 NOTE — Progress Notes (Signed)
  HEMATOLOGY-ONCOLOGY TELEPHONE VISIT PROGRESS NOTE  I connected with Andrea [name] on 01/11/2019 at  2:45 PM EST by telephone and verified that I am speaking with the correct person using two identifiers.  I discussed the limitations, risks, security and privacy concerns of performing an evaluation and management service by telephone and the availability of in person appointments.  I also discussed with the patient that there may be a patient responsible charge related to this service. The patient expressed understanding and agreed to proceed.   History of Present Illness: Andrea [name] is a 73 y.o. female with above-mentioned history of left breast DCIS. She underwent a left lumpectomy on 01/04/19 with Dr. Brantley Stage for which pathology showed high grade DCIS, 2.0cm, involved margins. She is over the phone today to review the pathology report and discuss further treatment.   Oncology History  Ductal carcinoma in situ (DCIS) of left breast  12/09/2018 Initial Diagnosis   Routine screening mammogram detected left breast calcifications spanning 1.8cm in the UOQ, and an additional group of calcifications in the UOQ spanning 0.4cm. Biopsy DCIS with calcifications, high grade, ER+ 100%, PR+ 10%.    01/04/2019 Surgery   Left lumpectomy ([facility]): high grade DCIS, 2.0cm, involved margins.      Observations/Objective:  Patient could not tolerate Effexor which made her dizzy and sleepy.  She stopped taking it. However the hot flashes remained fairly significant and she is asking if there are any other options for the hot flashes.   Assessment Plan:  Ductal carcinoma in situ (DCIS) of left breast Left lumpectomy: DCIS high-grade 2 cm, focally present at the inferior/medial junction margin ER 100%, PR 10%, Tis NX stage 0  Pathology review: I discussed final pathology report including the positive margins.  She will be presented in the breast tumor board and most likely will need additional  surgery.  Treatment plan: 1.  Additional surgery for the positive margin 2.  Adjuvant radiation therapy 3.  Follow-up adjuvant tamoxifen x5 years  Patient has been married to her current husband for the past 4 years.  However they both live in their separate houses. She needs her husband to help her with medical decisions because she has profound hearing impairment.  Severe hot flashes: Could not tolerate Effexor.  We will discontinue it and start her on gabapentin 100 mg at bedtime.  I will see her back after radiation is complete.  I discussed the assessment and treatment plan with the patient. The patient was provided an opportunity to ask questions and all were answered. The patient agreed with the plan and demonstrated an understanding of the instructions. The patient was advised to call back or seek an in-person evaluation if the symptoms worsen or if the condition fails to improve as anticipated.   I provided 15 minutes of non-face-to-face time during this encounter.   Rulon Eisenmenger, MD 01/11/2019    I, Molly Dorshimer, am acting as scribe for Nicholas Lose, MD.  I have reviewed the above documentation for accuracy and completeness, and I agree with the above.

## 2019-01-11 ENCOUNTER — Other Ambulatory Visit: Payer: Self-pay

## 2019-01-11 ENCOUNTER — Inpatient Hospital Stay: Payer: Medicare Other | Attending: Hematology and Oncology | Admitting: Hematology and Oncology

## 2019-01-11 ENCOUNTER — Encounter (HOSPITAL_BASED_OUTPATIENT_CLINIC_OR_DEPARTMENT_OTHER): Payer: Self-pay | Admitting: *Deleted

## 2019-01-11 DIAGNOSIS — D0512 Intraductal carcinoma in situ of left breast: Secondary | ICD-10-CM | POA: Diagnosis not present

## 2019-01-11 MED ORDER — GABAPENTIN 100 MG PO CAPS: 100.0000 mg | ORAL_CAPSULE | Freq: Every day | ORAL | 3 refills | Status: DC

## 2019-01-11 NOTE — Assessment & Plan Note (Signed)
Left lumpectomy: DCIS high-grade 2 cm, focally present at the inferior/medial junction margin ER 100%, PR 10%, Tis NX stage 0  Pathology review: I discussed final pathology report including the positive margins.  She will be presented in the breast tumor board and most likely will need additional surgery.  Treatment plan: 1.  Additional surgery for the positive margin 2.  Adjuvant radiation therapy 3.  Follow-up adjuvant tamoxifen x5 years  Patient has been married to her current husband for the past 4 years.  However they both live in their separate houses. She needs her husband to help her with medical decisions because she has profound hearing impairment.  I will see her back after radiation is complete.

## 2019-01-14 ENCOUNTER — Other Ambulatory Visit (HOSPITAL_COMMUNITY)
Admission: RE | Admit: 2019-01-14 | Discharge: 2019-01-14 | Disposition: A | Discharge status: Home / Self Care | Payer: Medicare Other | Source: Ambulatory Visit | Attending: Surgery | Admitting: Surgery

## 2019-01-14 DIAGNOSIS — Z01812 Encounter for preprocedural laboratory examination: Secondary | ICD-10-CM | POA: Insufficient documentation

## 2019-01-14 DIAGNOSIS — Z20828 Contact with and (suspected) exposure to other viral communicable diseases: Secondary | ICD-10-CM | POA: Diagnosis not present

## 2019-01-14 NOTE — Progress Notes (Signed)

## 2019-01-16 LAB — NOVEL CORONAVIRUS, NAA (HOSP ORDER, SEND-OUT TO REF LAB; TAT 18-24 HRS): SARS-CoV-2, NAA: NOT DETECTED

## 2019-01-18 ENCOUNTER — Other Ambulatory Visit: Payer: Self-pay

## 2019-01-18 ENCOUNTER — Ambulatory Visit (HOSPITAL_BASED_OUTPATIENT_CLINIC_OR_DEPARTMENT_OTHER)
Admission: RE | Admit: 2019-01-18 | Discharge: 2019-01-18 | Disposition: A | Discharge status: Home / Self Care | Payer: Medicare Other | Attending: Surgery | Admitting: Surgery

## 2019-01-18 ENCOUNTER — Ambulatory Visit (HOSPITAL_BASED_OUTPATIENT_CLINIC_OR_DEPARTMENT_OTHER): Payer: Medicare Other | Admitting: Certified Registered Nurse Anesthetist

## 2019-01-18 ENCOUNTER — Encounter (HOSPITAL_BASED_OUTPATIENT_CLINIC_OR_DEPARTMENT_OTHER): Payer: Self-pay | Admitting: *Deleted

## 2019-01-18 ENCOUNTER — Encounter (HOSPITAL_BASED_OUTPATIENT_CLINIC_OR_DEPARTMENT_OTHER)
Admission: RE | Disposition: A | Discharge status: Home / Self Care | Payer: Self-pay | Source: Home / Self Care | Attending: Surgery

## 2019-01-18 DIAGNOSIS — Z79899 Other long term (current) drug therapy: Secondary | ICD-10-CM | POA: Insufficient documentation

## 2019-01-18 DIAGNOSIS — K219 Gastro-esophageal reflux disease without esophagitis: Secondary | ICD-10-CM | POA: Diagnosis not present

## 2019-01-18 DIAGNOSIS — D0512 Intraductal carcinoma in situ of left breast: Secondary | ICD-10-CM | POA: Insufficient documentation

## 2019-01-18 DIAGNOSIS — E785 Hyperlipidemia, unspecified: Secondary | ICD-10-CM | POA: Insufficient documentation

## 2019-01-18 DIAGNOSIS — Z7982 Long term (current) use of aspirin: Secondary | ICD-10-CM | POA: Diagnosis not present

## 2019-01-18 DIAGNOSIS — I10 Essential (primary) hypertension: Secondary | ICD-10-CM | POA: Insufficient documentation

## 2019-01-18 DIAGNOSIS — I429 Cardiomyopathy, unspecified: Secondary | ICD-10-CM | POA: Diagnosis not present

## 2019-01-18 DIAGNOSIS — D0582 Other specified type of carcinoma in situ of left breast: Secondary | ICD-10-CM | POA: Diagnosis not present

## 2019-01-18 DIAGNOSIS — I2510 Atherosclerotic heart disease of native coronary artery without angina pectoris: Secondary | ICD-10-CM | POA: Diagnosis not present

## 2019-01-18 HISTORY — PX: RE-EXCISION OF BREAST LUMPECTOMY: SHX6048

## 2019-01-18 SURGERY — EXCISION, LESION, BREAST
Anesthesia: General | Site: Breast | Laterality: Left

## 2019-01-18 MED ORDER — LACTATED RINGERS IV SOLN
INTRAVENOUS | Status: DC
  Administered 2019-01-18 (×2): via INTRAVENOUS

## 2019-01-18 MED ORDER — CHLORHEXIDINE GLUCONATE CLOTH 2 % EX PADS: 6.0000 | MEDICATED_PAD | Freq: Once | CUTANEOUS | Status: DC

## 2019-01-18 MED ORDER — CEFAZOLIN SODIUM-DEXTROSE 2-4 GM/100ML-% IV SOLN
INTRAVENOUS | Status: AC
  Filled 2019-01-18: qty 100

## 2019-01-18 MED ORDER — PHENYLEPHRINE HCL (PRESSORS) 10 MG/ML IV SOLN
INTRAVENOUS | Status: DC | PRN
  Administered 2019-01-18 (×5): 80 ug via INTRAVENOUS

## 2019-01-18 MED ORDER — DEXAMETHASONE SODIUM PHOSPHATE 4 MG/ML IJ SOLN
INTRAMUSCULAR | Status: DC | PRN
  Administered 2019-01-18: 5 mg via INTRAVENOUS

## 2019-01-18 MED ORDER — CEFAZOLIN SODIUM-DEXTROSE 2-4 GM/100ML-% IV SOLN
2.0000 g | INTRAVENOUS | Status: AC
  Administered 2019-01-18: 2 g via INTRAVENOUS

## 2019-01-18 MED ORDER — ONDANSETRON HCL 4 MG/2ML IJ SOLN: 4.0000 mg | Freq: Four times a day (QID) | INTRAMUSCULAR | Status: DC | PRN

## 2019-01-18 MED ORDER — PROPOFOL 10 MG/ML IV BOLUS
INTRAVENOUS | Status: AC
  Filled 2019-01-18: qty 20

## 2019-01-18 MED ORDER — FENTANYL CITRATE (PF) 100 MCG/2ML IJ SOLN
INTRAMUSCULAR | Status: AC
  Filled 2019-01-18: qty 2

## 2019-01-18 MED ORDER — OXYCODONE HCL 5 MG PO TABS: 5.0000 mg | ORAL_TABLET | Freq: Once | ORAL | Status: DC | PRN

## 2019-01-18 MED ORDER — MIDAZOLAM HCL 2 MG/2ML IJ SOLN
INTRAMUSCULAR | Status: AC
  Filled 2019-01-18: qty 2

## 2019-01-18 MED ORDER — LIDOCAINE HCL (CARDIAC) PF 100 MG/5ML IV SOSY
PREFILLED_SYRINGE | INTRAVENOUS | Status: DC | PRN
  Administered 2019-01-18: 50 mg via INTRAVENOUS

## 2019-01-18 MED ORDER — ACETAMINOPHEN 500 MG PO TABS
1000.0000 mg | ORAL_TABLET | ORAL | Status: AC
  Administered 2019-01-18: 1000 mg via ORAL

## 2019-01-18 MED ORDER — LIDOCAINE 2% (20 MG/ML) 5 ML SYRINGE
INTRAMUSCULAR | Status: AC
  Filled 2019-01-18: qty 5

## 2019-01-18 MED ORDER — ACETAMINOPHEN 500 MG PO TABS
ORAL_TABLET | ORAL | Status: AC
  Filled 2019-01-18: qty 2

## 2019-01-18 MED ORDER — PROPOFOL 10 MG/ML IV BOLUS
INTRAVENOUS | Status: DC | PRN
  Administered 2019-01-18: 120 mg via INTRAVENOUS

## 2019-01-18 MED ORDER — HYDROCODONE-ACETAMINOPHEN 5-325 MG PO TABS: 1.0000 | ORAL_TABLET | Freq: Four times a day (QID) | ORAL | 0 refills | Status: DC | PRN

## 2019-01-18 MED ORDER — FENTANYL CITRATE (PF) 100 MCG/2ML IJ SOLN
25.0000 ug | INTRAMUSCULAR | Status: DC | PRN
  Administered 2019-01-18: 25 ug via INTRAVENOUS

## 2019-01-18 MED ORDER — OXYCODONE HCL 5 MG/5ML PO SOLN: 5.0000 mg | Freq: Once | ORAL | Status: DC | PRN

## 2019-01-18 MED ORDER — ONDANSETRON HCL 4 MG/2ML IJ SOLN
INTRAMUSCULAR | Status: DC | PRN
  Administered 2019-01-18: 4 mg via INTRAVENOUS

## 2019-01-18 MED ORDER — CELECOXIB 200 MG PO CAPS
200.0000 mg | ORAL_CAPSULE | ORAL | Status: AC
  Administered 2019-01-18: 200 mg via ORAL

## 2019-01-18 MED ORDER — FENTANYL CITRATE (PF) 100 MCG/2ML IJ SOLN
INTRAMUSCULAR | Status: DC | PRN
  Administered 2019-01-18: 50 ug via INTRAVENOUS

## 2019-01-18 MED ORDER — BUPIVACAINE HCL (PF) 0.25 % IJ SOLN
INTRAMUSCULAR | Status: DC | PRN
  Administered 2019-01-18: 10 mL

## 2019-01-18 MED ORDER — CELECOXIB 200 MG PO CAPS
ORAL_CAPSULE | ORAL | Status: AC
  Filled 2019-01-18: qty 1

## 2019-01-18 MED ORDER — MIDAZOLAM HCL 5 MG/5ML IJ SOLN
INTRAMUSCULAR | Status: DC | PRN
  Administered 2019-01-18: 1 mg via INTRAVENOUS

## 2019-01-18 MED ORDER — ONDANSETRON HCL 4 MG/2ML IJ SOLN
INTRAMUSCULAR | Status: AC
  Filled 2019-01-18: qty 2

## 2019-01-18 SURGICAL SUPPLY — 53 items
ADH SKN CLS APL DERMABOND .7 (GAUZE/BANDAGES/DRESSINGS) ×1
APL PRP STRL LF DISP 70% ISPRP (MISCELLANEOUS) ×1
APPLIER CLIP 9.375 MED OPEN (MISCELLANEOUS) ×3
APR CLP MED 9.3 20 MLT OPN (MISCELLANEOUS) ×1
BINDER BREAST LRG (GAUZE/BANDAGES/DRESSINGS) IMPLANT
BINDER BREAST MEDIUM (GAUZE/BANDAGES/DRESSINGS) ×2 IMPLANT
BINDER BREAST XLRG (GAUZE/BANDAGES/DRESSINGS) IMPLANT
BINDER BREAST XXLRG (GAUZE/BANDAGES/DRESSINGS) IMPLANT
BLADE SURG 15 STRL LF DISP TIS (BLADE) ×1 IMPLANT
BLADE SURG 15 STRL SS (BLADE) ×3
CANISTER SUCT 1200ML W/VALVE (MISCELLANEOUS) ×3 IMPLANT
CHLORAPREP W/TINT 26 (MISCELLANEOUS) ×3 IMPLANT
CLIP APPLIE 9.375 MED OPEN (MISCELLANEOUS) IMPLANT
COVER BACK TABLE REUSABLE LG (DRAPES) ×3 IMPLANT
COVER MAYO STAND REUSABLE (DRAPES) ×3 IMPLANT
COVER WAND RF STERILE (DRAPES) IMPLANT
DECANTER SPIKE VIAL GLASS SM (MISCELLANEOUS) ×3 IMPLANT
DERMABOND ADVANCED (GAUZE/BANDAGES/DRESSINGS) ×2
DERMABOND ADVANCED .7 DNX12 (GAUZE/BANDAGES/DRESSINGS) ×1 IMPLANT
DRAPE LAPAROSCOPIC ABDOMINAL (DRAPES) IMPLANT
DRAPE LAPAROTOMY 100X72 PEDS (DRAPES) ×3 IMPLANT
DRAPE UTILITY XL STRL (DRAPES) ×3 IMPLANT
ELECT COATED BLADE 2.86 ST (ELECTRODE) ×3 IMPLANT
ELECT REM PT RETURN 9FT ADLT (ELECTROSURGICAL) ×3
ELECTRODE REM PT RTRN 9FT ADLT (ELECTROSURGICAL) ×1 IMPLANT
GLOVE BIOGEL PI IND STRL 7.0 (GLOVE) IMPLANT
GLOVE BIOGEL PI IND STRL 8 (GLOVE) ×1 IMPLANT
GLOVE BIOGEL PI INDICATOR 7.0 (GLOVE) ×2
GLOVE BIOGEL PI INDICATOR 8 (GLOVE) ×2
GLOVE ECLIPSE 7.5 STRL STRAW (GLOVE) ×2 IMPLANT
GLOVE ECLIPSE 8.0 STRL XLNG CF (GLOVE) ×3 IMPLANT
GLOVE EXAM NITRILE MD LF STRL (GLOVE) ×2 IMPLANT
GOWN STRL REUS W/ TWL LRG LVL3 (GOWN DISPOSABLE) ×2 IMPLANT
GOWN STRL REUS W/TWL LRG LVL3 (GOWN DISPOSABLE) ×6
HEMOSTAT ARISTA ABSORB 3G PWDR (HEMOSTASIS) IMPLANT
KIT MARKER MARGIN INK (KITS) ×2 IMPLANT
NDL HYPO 25X1 1.5 SAFETY (NEEDLE) ×1 IMPLANT
NEEDLE HYPO 25X1 1.5 SAFETY (NEEDLE) ×3 IMPLANT
NS IRRIG 1000ML POUR BTL (IV SOLUTION) ×3 IMPLANT
PACK BASIN DAY SURGERY FS (CUSTOM PROCEDURE TRAY) ×3 IMPLANT
PENCIL SMOKE EVACUATOR (MISCELLANEOUS) ×3 IMPLANT
SLEEVE SCD COMPRESS KNEE MED (MISCELLANEOUS) ×3 IMPLANT
SPONGE LAP 4X18 RFD (DISPOSABLE) ×3 IMPLANT
STAPLER VISISTAT 35W (STAPLE) IMPLANT
SUT MON AB 4-0 PC3 18 (SUTURE) ×3 IMPLANT
SUT SILK 2 0 SH (SUTURE) IMPLANT
SUT VICRYL 3-0 CR8 SH (SUTURE) ×3 IMPLANT
SYR CONTROL 10ML LL (SYRINGE) ×3 IMPLANT
TOWEL GREEN STERILE FF (TOWEL DISPOSABLE) ×6 IMPLANT
TRAY FAXITRON CT DISP (TRAY / TRAY PROCEDURE) IMPLANT
TUBE CONNECTING 20'X1/4 (TUBING) ×1
TUBE CONNECTING 20X1/4 (TUBING) ×2 IMPLANT
YANKAUER SUCT BULB TIP NO VENT (SUCTIONS) ×3 IMPLANT

## 2019-01-18 NOTE — Anesthesia Preprocedure Evaluation (Signed)
Anesthesia Evaluation  Patient identified by MRN, date of birth, ID band Patient awake    Reviewed: Allergy & Precautions, H&P , NPO status , Patient's Chart, lab work & pertinent test results  Airway Mallampati: II   Neck ROM: full    Dental   Pulmonary neg pulmonary ROS,    breath sounds clear to auscultation       Cardiovascular hypertension, + CAD   Rhythm:regular Rate:Normal     Neuro/Psych  Headaches,    GI/Hepatic GERD  ,  Endo/Other    Renal/GU      Musculoskeletal   Abdominal   Peds  Hematology   Anesthesia Other Findings   Reproductive/Obstetrics                             Anesthesia Physical Anesthesia Plan  ASA: III  Anesthesia Plan: General   Post-op Pain Management:    Induction: Intravenous  PONV Risk Score and Plan: 3 and Ondansetron, Dexamethasone and Treatment may vary due to age or medical condition  Airway Management Planned: LMA  Additional Equipment:   Intra-op Plan:   Post-operative Plan:   Informed Consent: I have reviewed the patients History and Physical, chart, labs and discussed the procedure including the risks, benefits and alternatives for the proposed anesthesia with the patient or authorized representative who has indicated his/her understanding and acceptance.       Plan Discussed with: CRNA, Anesthesiologist and Surgeon  Anesthesia Plan Comments:         Anesthesia Quick Evaluation

## 2019-01-18 NOTE — Anesthesia Procedure Notes (Signed)
Procedure Name: LMA Insertion Date/Time: 01/18/2019 1:11 PM Performed by: Bufford Spikes, CRNA Pre-anesthesia Checklist: Patient identified, Emergency Drugs available, Suction available and Patient being monitored Patient Re-evaluated:Patient Re-evaluated prior to induction Oxygen Delivery Method: Circle system utilized Preoxygenation: Pre-oxygenation with 100% oxygen Induction Type: IV induction Ventilation: Mask ventilation without difficulty LMA: LMA inserted LMA Size: 4.0 Number of attempts: 1 Airway Equipment and Method: Bite block Placement Confirmation: positive ETCO2 Tube secured with: Tape Dental Injury: Teeth and Oropharynx as per pre-operative assessment

## 2019-01-18 NOTE — Discharge Instructions (Signed)
[facility] °Office Phone Number [phone] ° °BREAST BIOPSY/ PARTIAL MASTECTOMY: POST OP INSTRUCTIONS ° °Always review your discharge instruction sheet given to you by the facility where your surgery was performed. ° °IF YOU HAVE DISABILITY OR FAMILY LEAVE FORMS, YOU MUST BRING THEM TO THE OFFICE FOR PROCESSING.  DO NOT GIVE THEM TO YOUR DOCTOR. ° °1. A prescription for pain medication may be given to you upon discharge.  Take your pain medication as prescribed, if needed.  If narcotic pain medicine is not needed, then you may take acetaminophen (Tylenol) or ibuprofen (Advil) as needed. °2. Take your usually prescribed medications unless otherwise directed °3. If you need a refill on your pain medication, please contact your pharmacy.  They will contact our office to request authorization.  Prescriptions will not be filled after 5pm or on week-ends. °4. You should eat very light the first 24 hours after surgery, such as soup, crackers, pudding, etc.  Resume your normal diet the day after surgery. °5. Most patients will experience some swelling and bruising in the breast.  Ice packs and a good support bra will help.  Swelling and bruising can take several days to resolve.  °6. It is common to experience some constipation if taking pain medication after surgery.  Increasing fluid intake and taking a stool softener will usually help or prevent this problem from occurring.  A mild laxative (Milk of Magnesia or Miralax) should be taken according to package directions if there are no bowel movements after 48 hours. °7. Unless discharge instructions indicate otherwise, you may remove your bandages 24-48 hours after surgery, and you may shower at that time.  You may have steri-strips (small skin tapes) in place directly over the incision.  These strips should be left on the skin for 7-10 days.  If your surgeon used skin glue on the incision, you may shower in 24 hours.  The glue will flake off over the  next 2-3 weeks.  Any sutures or staples will be removed at the office during your follow-up visit. °8. ACTIVITIES:  You may resume regular daily activities (gradually increasing) beginning the next day.  Wearing a good support bra or sports bra minimizes pain and swelling.  You may have sexual intercourse when it is comfortable. °a. You may drive when you no longer are taking prescription pain medication, you can comfortably wear a seatbelt, and you can safely maneuver your car and apply brakes. °b. RETURN TO WORK:  ______________________________________________________________________________________ °9. You should see your doctor in the office for a follow-up appointment approximately two weeks after your surgery.  Your doctor’s nurse will typically make your follow-up appointment when she calls you with your pathology report.  Expect your pathology report 2-3 business days after your surgery.  You may call to check if you do not hear from us after three days. °10. OTHER INSTRUCTIONS: _______________________________________________________________________________________________ _____________________________________________________________________________________________________________________________________ °_____________________________________________________________________________________________________________________________________ °_____________________________________________________________________________________________________________________________________ ° °WHEN TO CALL YOUR DOCTOR: °1. Fever over 101.0 °2. Nausea and/or vomiting. °3. Extreme swelling or bruising. °4. Continued bleeding from incision. °5. Increased pain, redness, or drainage from the incision. ° °The clinic staff is available to answer your questions during regular business hours.  Please don’t hesitate to call and ask to speak to one of the nurses for clinical concerns.  If you have a medical emergency, go to the nearest  emergency room or call 911.  A surgeon from [facility] is always on call at the hospital. ° °For further questions, please visit [email or web address]  ° ° °  Next dose of Tylenol or Ibuprofen can be take at 6pm today.   Post Anesthesia Home Care Instructions  Activity: Get plenty of rest for the remainder of the day. A responsible individual must stay with you for 24 hours following the procedure.  For the next 24 hours, DO NOT: -Drive a car -Paediatric nurse -Drink alcoholic beverages -Take any medication unless instructed by your physician -Make any legal decisions or sign important papers.  Meals: Start with liquid foods such as gelatin or soup. Progress to regular foods as tolerated. Avoid greasy, spicy, heavy foods. If nausea and/or vomiting occur, drink only clear liquids until the nausea and/or vomiting subsides. Call your physician if vomiting continues.  Special Instructions/Symptoms: Your throat may feel dry or sore from the anesthesia or the breathing tube placed in your throat during surgery. If this causes discomfort, gargle with warm salt water. The discomfort should disappear within 24 hours.  If you had a scopolamine patch placed behind your ear for the management of post- operative nausea and/or vomiting:  1. The medication in the patch is effective for 72 hours, after which it should be removed.  Wrap patch in a tissue and discard in the trash. Wash hands thoroughly with soap and water. 2. You may remove the patch earlier than 72 hours if you experience unpleasant side effects which may include dry mouth, dizziness or visual disturbances. 3. Avoid touching the patch. Wash your hands with soap and water after contact with the patch.

## 2019-01-18 NOTE — Op Note (Signed)
Left Breast Re-excison Lumpectomy Procedure Note  Indications:  This patient returns following an initial lumpectomy.  Analysis of the pathology specimen revealed microscopic involvement of the medial and inferior margins.  The patient now returns for re-excision. The procedure has been discussed with the patient. Alternatives to surgery have been discussed with the patient.  Risks of surgery include bleeding,  Infection,  Seroma formation, death,  and the need for further surgery.   The patient understands and wishes to proceed.  Pre-operative Diagnosis: left BREAST DCIS  Post-operative Diagnosis:SAME     Surgeon: Turner Daniels MD  Assistants: NONE  Anesthesia: General LMA anesthesia and Local anesthesia 0.25.% bupivacaine  ASA Class: 2  Procedure Details  The patient was seen in the Holding Room. The risks, benefits, complications, treatment options, and expected outcomes were discussed with the patient. The possibilities of reaction to medication, pulmonary aspiration, bleeding, infection, the need for additional procedures, failure to diagnose a condition, and creating a complication requiring transfusion or operation were discussed with the patient. The patient concurred with the proposed plan, giving informed consent.  The site of surgery properly noted/marked. The patient was taken to Operating Room # 3, identified as Andrea [name] and the procedure verified as Breast Re-excision Lumpectomy. A Time Out was held and the above information confirmed.  the patient was placed supine.  The breast was prepped and draped in standard fashion. Marcaine 0.25%  was used to anesthetize the skin around the previous lumpectomy incision.  The incision was opened.  A  seroma was evacuated.  Additional local anesthesia was delivered medial and inferiorly within the lumpectomy cavity.  A full thickness re-excision was performed.  An orientation suture was placed anteriorly.  The new margin was inked  and the specimen was submitted to pathology.  Hemostasis was achieved with cautery.  Closure was performed in 2 layers with a 3-0 Vicryl and 4 0 monocry subcuticular closure.    Dermabond was  applied.  At the end of the operation, all sponge, instrument and needle counts were correct.   Findings: grossly clear surgical margins  Estimated Blood Loss:  Minimal         Drains: none         Total IV Fluids: Per record          Specimens: see above         Implants: none         Complications:  None; patient tolerated the procedure well.         Disposition: PACU - hemodynamically stable.         Condition: stable  Attending Attestation: I performed the procedure.

## 2019-01-18 NOTE — Interval H&P Note (Signed)
History and Physical Interval Note:  01/18/2019 12:44 PM  Andrea [name]  has presented today for surgery, with the diagnosis of BREAST CANCER.  The various methods of treatment have been discussed with the patient and family. After consideration of risks, benefits and other options for treatment, the patient has consented to  Procedure(s): RE-EXCISION OF LEFT BREAST LUMPECTOMY (Left) as a surgical intervention.  The patient's history has been reviewed, patient examined, no change in status, stable for surgery.  I have reviewed the patient's chart and labs.  Questions were answered to the patient's satisfaction.     [place]

## 2019-01-18 NOTE — H&P (Signed)
Andrea [name] is an 73 y.o. female.   Chief Complaint: Left breast DCIS HPI: Patient presents for reexcision left breast lumpectomy of the medial and inferior margin due to left breast DCIS after lumpectomy.  Past Medical History:  Diagnosis Date  . Cancer ([place]) 11/2018   left breast DCIS  . Cardiomyopathy ([place])   . Complication of anesthesia    states ear drum burst during hysterectomy surgery  . GERD (gastroesophageal reflux disease)   . Headache    r/t sinus  . 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 Hyperlipidemia   . Hypertension   . Palpitations     Past Surgical History:  Procedure Laterality Date  . ABDOMINAL HYSTERECTOMY    . BREAST LUMPECTOMY WITH RADIOACTIVE SEED LOCALIZATION Left 01/04/2019   Procedure: LEFT BREAST LUMPECTOMY WITH RADIOACTIVE SEED LOCALIZATION;  Surgeon: Erroll Luna, MD;  Location: [facility];  Service: General;  Laterality: Left;  . INNER EAR SURGERY    . ROTATOR CUFF REPAIR Right   . WRIST SURGERY      Family History  Problem Relation Age of Onset  . Valvular heart disease Brother    Social History:  reports that she has never smoked. She has never used smokeless tobacco. She reports current alcohol use. She reports that she does not use drugs.  Allergies:  Allergies  Allergen Reactions  . Cortisone     States allegic to oral Cortisone, but not to "shot"    Medications Prior to Admission  Medication Sig Dispense Refill  . Ascorbic Acid (VITAMIN C) 1000 MG tablet Take 1,000 mg by mouth daily.     Marland Kitchen aspirin EC 81 MG tablet Take 81 mg by mouth. TAKES ONLY TWICE PER [facility]    . AZO-CRANBERRY PO Take by mouth.    . beta carotene 10000 UNIT capsule Take 10,000 Units by mouth daily.     . Calcium Carb-Cholecalciferol (CALCIUM-VITAMIN D) 500-200 MG-UNIT tablet Take 1 tablet by mouth daily.     . carvedilol (COREG) 6.25 MG tablet TAKE 1/2 TABLET BY MOUTH TWICE DAILY 180 tablet 0  . estradiol (ESTRACE) 0.1  MG/GM vaginal cream Place 1 Applicatorful vaginally at bedtime.    . gabapentin (NEURONTIN) 100 MG capsule Take 1 capsule (100 mg total) by mouth at bedtime. 30 capsule 3  . Magnesium 300 MG CAPS Take by mouth.    . Multiple Vitamins-Minerals (OCUVITE PO) Take 1 tablet by mouth daily.     . Omega-3 1000 MG CAPS Take 2,000 mg by mouth 2 (two) times daily.    Marland Kitchen omeprazole (PRILOSEC) 40 MG capsule Take 20 mg by mouth daily.     . Probiotic CAPS Take 1 capsule by mouth daily.     . vitamin B-12 (CYANOCOBALAMIN) 1000 MCG tablet Take 1,000 mcg by mouth daily.     Marland Kitchen zinc gluconate 50 MG tablet Take 50 mg by mouth daily.       No results found for this or any previous visit (from the past 48 hour(s)). No results found.  Review of Systems  All other systems reviewed and are negative.   Blood pressure 131/85, temperature 98.5 F (36.9 C), temperature source Oral, resp. rate 20, height 5\' 2"  (1.575 m), weight 59.6 kg, SpO2 100 %. Physical Exam  Constitutional: She is oriented to person, place, and time. She appears well-developed.  HENT:  Head: Normocephalic.  Eyes: Pupils are equal, round, and reactive to light.  Neck: Normal  range of motion.  Cardiovascular: Normal rate.  Respiratory: Effort normal.  Left breast incision intact.  Musculoskeletal: Normal range of motion.  Neurological: She is alert and oriented to person, place, and time.  Skin: Skin is warm.     Assessment/Plan Left breast DCIS status post lumpectomy with focally positive inferior medial margin  Reexcision of inferior/medial margin. The procedure has been discussed with the patient.  Alternative therapies have been discussed with the patient.  Operative risks include bleeding,  Infection,  Organ injury,  Nerve injury,  Blood vessel injury,  DVT,  Pulmonary embolism,  Death,  And possible reoperation.  Medical management risks include worsening of present situation.  The success of the procedure is 50 -90 % at treating  patients symptoms.  The patient understands and agrees to proceed.  Turner Daniels, MD 01/18/2019, 12:43 PM

## 2019-01-19 ENCOUNTER — Encounter (HOSPITAL_BASED_OUTPATIENT_CLINIC_OR_DEPARTMENT_OTHER): Payer: Self-pay | Admitting: Surgery

## 2019-01-19 NOTE — Transfer of Care (Signed)
Immediate Anesthesia Transfer of Care Note  Patient: Andrea [name]  Procedure(s) Performed: RE-EXCISION OF LEFT BREAST LUMPECTOMY (Left Breast)  Patient Location: PACU  Anesthesia Type:General  Level of Consciousness: awake, alert  and oriented  Airway & Oxygen Therapy: Patient Spontanous Breathing and Patient connected to nasal cannula oxygen  Post-op Assessment: Report given to RN and Post -op Vital signs reviewed and stable  Post vital signs: Reviewed and stable  Last Vitals:  Vitals Value Taken Time  BP 159/89 01/18/19 1500  Temp 36.7 C 01/18/19 1500  Pulse 94 01/18/19 1500  Resp 18 01/18/19 1500  SpO2 97 % 01/18/19 1500    Last Pain:  Vitals:   01/18/19 1500  TempSrc:   PainSc: 2          Complications: No apparent anesthesia complications

## 2019-01-19 NOTE — Anesthesia Postprocedure Evaluation (Signed)
Anesthesia Post Note  Patient: Andrea [name]  Procedure(s) Performed: RE-EXCISION OF LEFT BREAST LUMPECTOMY (Left Breast)     Patient location during evaluation: PACU Anesthesia Type: General Level of consciousness: awake and alert Pain management: pain level controlled Vital Signs Assessment: post-procedure vital signs reviewed and stable Respiratory status: spontaneous breathing, nonlabored ventilation, respiratory function stable and patient connected to nasal cannula oxygen Cardiovascular status: blood pressure returned to baseline and stable Postop Assessment: no apparent nausea or vomiting Anesthetic complications: no    Last Vitals:  Vitals:   01/18/19 1430 01/18/19 1500  BP: (!) 142/83 (!) 159/89  Pulse: 87 94  Resp: 18 18  Temp:  36.7 C  SpO2: 97% 97%    Last Pain:  Vitals:   01/18/19 1500  TempSrc:   PainSc: 2    Pain Goal:                   Tarin Johndrow

## 2019-01-20 ENCOUNTER — Encounter: Payer: Self-pay | Admitting: *Deleted

## 2019-01-20 LAB — SURGICAL PATHOLOGY

## 2019-02-07 ENCOUNTER — Ambulatory Visit
Admission: RE | Admit: 2019-02-07 | Discharge: 2019-02-07 | Disposition: A | Discharge status: Home / Self Care | Payer: Medicare Other | Source: Ambulatory Visit | Attending: Radiation Oncology | Admitting: Radiation Oncology

## 2019-02-07 ENCOUNTER — Encounter: Payer: Self-pay | Admitting: Radiation Oncology

## 2019-02-07 ENCOUNTER — Other Ambulatory Visit: Payer: Self-pay

## 2019-02-07 VITALS — BP 140/75 | HR 90 | Temp 98.0°F | Resp 18 | Ht 62.0 in | Wt 133.5 lb

## 2019-02-07 DIAGNOSIS — Z79899 Other long term (current) drug therapy: Secondary | ICD-10-CM | POA: Insufficient documentation

## 2019-02-07 DIAGNOSIS — D512 Transcobalamin II deficiency: Secondary | ICD-10-CM | POA: Diagnosis not present

## 2019-02-07 DIAGNOSIS — Z170 Estrogen receptor positive status [ER+]: Secondary | ICD-10-CM | POA: Insufficient documentation

## 2019-02-07 DIAGNOSIS — Z510 Encounter for antineoplastic radiation therapy: Secondary | ICD-10-CM | POA: Insufficient documentation

## 2019-02-07 DIAGNOSIS — Z923 Personal history of irradiation: Secondary | ICD-10-CM | POA: Insufficient documentation

## 2019-02-07 DIAGNOSIS — D0512 Intraductal carcinoma in situ of left breast: Secondary | ICD-10-CM

## 2019-02-07 DIAGNOSIS — Z79810 Long term (current) use of selective estrogen receptor modulators (SERMs): Secondary | ICD-10-CM | POA: Diagnosis not present

## 2019-02-07 DIAGNOSIS — Z7982 Long term (current) use of aspirin: Secondary | ICD-10-CM | POA: Insufficient documentation

## 2019-02-07 NOTE — Progress Notes (Signed)
Radiation Oncology         [phone]) [phone] ________________________________  Name: Andrea [name] MRN: [id number]  Date: 02/07/2019  DOB: Oct 27, 1945  Re-Evaluation Note  CC: Ronita Hipps, MD  Nicholas Lose, MD    ICD-10-CM   1. Ductal carcinoma in situ (DCIS) of left breast  D05.12     Diagnosis:  Stage 0 Left Breast UOQ, Ductal Carcinoma In Situ, ER+ / PR+, Grade 3  Narrative:  The patient returns today to discuss radiation treatment options. She was seen in consultation on 12/09/2018. Since then, patient met with Dr. Lindi Adie on 12/16/2018, who recommended breast conservation surgery, adjuvant radiation therapy, and antiestrogen therapy with Tamoxifen for five years.  Patient underwent left breast lumpectomy without nodal biopsy on 01/04/2019, performed by Dr. Brantley Stage. Pathology from the procedure revealed ductal carcinoma in situ with calcifications, high-grade, spanning 2.0 cm. Carcinoma was focally present at the inferior/medial junctional margin.  Patient was last seen by Dr. Lindi Adie on 01/11/2019, who recommended additional surgery for the positive margin.  Patient underwent re-excision of left breast lumpectomy on 01/18/2019. Pathology from the procedure revealed benign breast parenchyma with previous procedure-related changes of both left medial and inferior margins. Both margins were negative for in situ or invasive carcinoma.  On review of systems, the patient reports some soreness in the left breast but no actual pain. She denies swelling in her left arm or hand and any other symptoms.    Allergies:  is allergic to cortisone.  Meds: Current Outpatient Medications  Medication Sig Dispense Refill  . Ascorbic Acid (VITAMIN C) 1000 MG tablet Take 1,000 mg by mouth daily.     Marland Kitchen aspirin EC 81 MG tablet Take 81 mg by mouth. TAKES ONLY TWICE PER WEKK    . AZO-CRANBERRY PO Take by mouth.    . beta carotene 10000 UNIT capsule Take 10,000 Units by mouth daily.     . Calcium  Carb-Cholecalciferol (CALCIUM-VITAMIN D) 500-200 MG-UNIT tablet Take 1 tablet by mouth daily.     . carvedilol (COREG) 6.25 MG tablet TAKE 1/2 TABLET BY MOUTH TWICE DAILY 180 tablet 0  . estradiol (ESTRACE) 0.1 MG/GM vaginal cream Place 1 Applicatorful vaginally at bedtime.    . gabapentin (NEURONTIN) 100 MG capsule Take 1 capsule (100 mg total) by mouth at bedtime. 30 capsule 3  . Magnesium 300 MG CAPS Take by mouth.    . Multiple Vitamins-Minerals (OCUVITE PO) Take 1 tablet by mouth daily.     . Omega-3 1000 MG CAPS Take 2,000 mg by mouth 2 (two) times daily.    Marland Kitchen omeprazole (PRILOSEC) 40 MG capsule Take 20 mg by mouth daily.     . Probiotic CAPS Take 1 capsule by mouth daily.     . vitamin B-12 (CYANOCOBALAMIN) 1000 MCG tablet Take 1,000 mcg by mouth daily.     Marland Kitchen zinc gluconate 50 MG tablet Take 50 mg by mouth daily.     Marland Kitchen HYDROcodone-acetaminophen (NORCO/VICODIN) 5-325 MG tablet Take 1 tablet by mouth every 6 (six) hours as needed for moderate pain. (Patient not taking: Reported on 02/07/2019) 15 tablet 0   No current facility-administered medications for this encounter.     Physical Findings: The patient is in no acute distress. Patient is alert and oriented.  height is _0  (1.575 m) and weight is 133 lb 8 oz (60.6 kg). Her temporal temperature is 98 F (36.7 C). Her blood pressure is 140/75 and her pulse is 90. Her respiration is 18  and oxygen saturation is 99%.  No significant changes. Lungs are clear to auscultation bilaterally. Heart has regular rate and rhythm. No palpable cervical, supraclavicular, or axillary adenopathy. Abdomen soft, non-tender, normal bowel sounds. Right breast: no palpable mass, nipple discharge or bleeding. Left breast: Well-healing periareolar scar.  Minimal edema in the breast area.  No nipple discharge or bleeding or dominant masses palpable  Lab Findings: Lab Results  Component Value Date   WBC 4.1 12/31/2018   HGB 13.7 12/31/2018   HCT 42.0  12/31/2018   MCV 95.7 12/31/2018   PLT 224 12/31/2018    Radiographic Findings: No results found.  Impression: Stage 0 Left Breast UOQ, Ductal Carcinoma In Situ, ER+ / PR+, Grade 3  Patient would be a good candidate for radiation therapy as part of her breast conserving therapy.  Given the high-grade nature of the patient's malignancy I would not recommend lumpectomy alone in this situation.  The patient does wish to be aggressive with her management and does wish to proceed with radiation therapy as part of her overall treatment.  I discussed the course of treatment side effects and potential toxicities of radiation therapy with patient in detail.  She appears to understand and wishes to proceed with planned course of treatment.  Plan:  Patient is scheduled for CT simulation later today.  She would appear to be a good candidate for hypofractionated accelerated radiation therapy over approximately 4 weeks.  Will use cardiac sparing techniques if necessary.  -----------------------------------  Blair Promise, PhD, MD  This document serves as a record of services personally performed by Gery Pray, MD. It was created on his behalf by Clerance Lav, a trained medical scribe. The creation of this record is based on the scribe's personal observations and the provider's statements to them. This document has been checked and approved by the attending provider.

## 2019-02-07 NOTE — Patient Instructions (Signed)
Coronavirus (COVID-19) Are you at risk?  Are you at risk for the Coronavirus (COVID-19)?  To be considered HIGH RISK for Coronavirus (COVID-19), you have to meet the following criteria:  . Traveled to [place], [place], [place], [place] or [place]; or in the [place] to [place], [place], [place], or [place]; and have fever, cough, and shortness of breath within the last 2 weeks of travel OR . Been in close contact with a person diagnosed with COVID-19 within the last 2 weeks and have fever, cough, and shortness of breath . IF YOU DO NOT MEET THESE CRITERIA, YOU ARE CONSIDERED LOW RISK FOR COVID-19.  What to do if you are HIGH RISK for COVID-19?  . If you are having a medical emergency, call 911. . Seek medical care right away. Before you go to a doctor's office, urgent care or emergency department, call ahead and tell them about your recent travel, contact with someone diagnosed with COVID-19, and your symptoms. You should receive instructions from your physician's office regarding next steps of care.  . When you arrive at healthcare provider, tell the healthcare staff immediately you have returned from visiting [place], [place], [place], [place] or [place]; or traveled in the [place] to [place], [place], [place], or [place]; in the last two weeks or you have been in close contact with a person diagnosed with COVID-19 in the last 2 weeks.   . Tell the health care staff about your symptoms: fever, cough and shortness of breath. . After you have been seen by a medical provider, you will be either: o Tested for (COVID-19) and discharged home on quarantine except to seek medical care if symptoms worsen, and asked to  - Stay home and avoid contact with others until you get your results (4-5 days)  - Avoid travel on public transportation if possible (such as bus, train, or airplane) or o Sent to the Emergency Department by EMS for evaluation, COVID-19 testing, and possible  admission depending on your condition and test results.  What to do if you are LOW RISK for COVID-19?  Reduce your risk of any infection by using the same precautions used for avoiding the common cold or flu:  . Wash your hands often with soap and warm water for at least 20 seconds.  If soap and water are not readily available, use an alcohol-based hand sanitizer with at least 60% alcohol.  . If coughing or sneezing, cover your mouth and nose by coughing or sneezing into the elbow areas of your shirt or coat, into a tissue or into your sleeve (not your hands). . Avoid shaking hands with others and consider head nods or verbal greetings only. . Avoid touching your eyes, nose, or mouth with unwashed hands.  . Avoid close contact with people who are sick. . Avoid places or events with large numbers of people in one location, like concerts or sporting events. . Carefully consider travel plans you have or are making. . If you are planning any travel outside or inside the [place], visit the CDC's Travelers' Health webpage for the latest health notices. . If you have some symptoms but not all symptoms, continue to monitor at home and seek medical attention if your symptoms worsen. . If you are having a medical emergency, call 911.   ADDITIONAL HEALTHCARE OPTIONS FOR PATIENTS  [facility] / e-Visit: [email or web address]         [facility]: [phone]  [facility]: [phone]                   [facility]: [phone]   

## 2019-02-07 NOTE — Progress Notes (Signed)
Location of Breast Cancer: Ductal carcinoma in situ (DCIS) of left breast  Histology per Pathology Report: 01/04/19:  FINAL MICROSCOPIC DIAGNOSIS:   A. BREAST, LEFT, LUMPECTOMY:  - Ductal carcinoma in situ with calcifications, high-grade, spanning 2.0  cm.   Receptor Status: ER(100%), PR (10%)  Did patient present with symptoms (if so, please note symptoms) or was this found on screening mammography?:  Routine screening mammogram detected left breast calcifications spanning 1.8cm in the UOQ, and an additional group of calcifications in the UOQ spanning 0.4cm. Biopsy DCIS with calcifications, high grade, ER+ 100%, PR+ 10%.       Past/Anticipated interventions by surgeon, if any: 01/04/19: Procedure: Left breast seed localized lumpectomy Surgeon: Erroll Luna, MD  01/18/19: Left Breast Re-excison Lumpectomy Procedure Note Indications:  This patient returns following an initial lumpectomy.  Analysis of the pathology specimen revealed microscopic involvement of the medial and inferior margins.  The patient now returns for re-excision. Surgeon: Turner Daniels MD  Past/Anticipated interventions by medical oncology, if any: Chemotherapy Per Dr. Lindi Adie 12/16/18:  Recommendation: 1. Breast conserving surgery 2. Followed by adjuvant radiation therapy 3. Followed by antiestrogen therapy with tamoxifen 5 years  Lymphedema issues, if any:  Pt with possible cording.   Pain issues, if any:  Pt denies c/o pain at this time.  SAFETY ISSUES:  Prior radiation? No  Pacemaker/ICD? No   Possible current pregnancy? No  Is the patient on methotrexate? No  Current Complaints / other details:  Pt presents today for f/u new with Dr. Sondra Come. Pt is unaccompanied. Pt states she "hates going in those tubes" in re: to CT sim appt. Conveyed to pt that CT was not the same as MRI for shoulder.     Loma Sousa, RN 02/07/2019,12:56 PM

## 2019-02-08 ENCOUNTER — Telehealth: Payer: Self-pay | Admitting: Hematology and Oncology

## 2019-02-08 NOTE — Telephone Encounter (Signed)
Scheduled appt per 12/7 sch message - unable to reach to . Left message with appt date and time

## 2019-02-14 ENCOUNTER — Encounter: Payer: Self-pay | Admitting: *Deleted

## 2019-02-14 DIAGNOSIS — Z510 Encounter for antineoplastic radiation therapy: Secondary | ICD-10-CM | POA: Diagnosis not present

## 2019-02-14 DIAGNOSIS — D0512 Intraductal carcinoma in situ of left breast: Secondary | ICD-10-CM | POA: Diagnosis not present

## 2019-02-17 ENCOUNTER — Other Ambulatory Visit: Payer: Self-pay

## 2019-02-17 ENCOUNTER — Ambulatory Visit
Admission: RE | Admit: 2019-02-17 | Discharge: 2019-02-17 | Disposition: A | Discharge status: Home / Self Care | Payer: Medicare Other | Source: Ambulatory Visit | Attending: Radiation Oncology | Admitting: Radiation Oncology

## 2019-02-17 DIAGNOSIS — D0512 Intraductal carcinoma in situ of left breast: Secondary | ICD-10-CM | POA: Diagnosis not present

## 2019-02-17 DIAGNOSIS — Z510 Encounter for antineoplastic radiation therapy: Secondary | ICD-10-CM | POA: Diagnosis not present

## 2019-02-17 NOTE — Progress Notes (Signed)
  Radiation Oncology         (336) [phone] ________________________________  Name: Andrea [name] MRN: [id number]  Date: 02/17/2019  DOB: Dec 05, 1945  Simulation Verification Note    ICD-10-CM   1. Ductal carcinoma in situ (DCIS) of left breast  D05.12     Status: outpatient  NARRATIVE: The patient was brought to the treatment unit and placed in the planned treatment position. The clinical setup was verified. Then port films were obtained and uploaded to the radiation oncology medical record software.  The treatment beams were carefully compared against the planned radiation fields. The position location and shape of the radiation fields was reviewed. They targeted volume of tissue appears to be appropriately covered by the radiation beams. Organs at risk appear to be excluded as planned.  Based on my personal review, I approved the simulation verification. The patient's treatment will proceed as planned.  -----------------------------------  Blair Promise, PhD, MD

## 2019-02-18 ENCOUNTER — Ambulatory Visit
Admission: RE | Admit: 2019-02-18 | Discharge: 2019-02-18 | Disposition: A | Discharge status: Home / Self Care | Payer: Medicare Other | Source: Ambulatory Visit | Attending: Radiation Oncology | Admitting: Radiation Oncology

## 2019-02-18 ENCOUNTER — Other Ambulatory Visit: Payer: Self-pay

## 2019-02-18 DIAGNOSIS — D0512 Intraductal carcinoma in situ of left breast: Secondary | ICD-10-CM | POA: Diagnosis not present

## 2019-02-18 DIAGNOSIS — Z510 Encounter for antineoplastic radiation therapy: Secondary | ICD-10-CM | POA: Diagnosis not present

## 2019-02-21 ENCOUNTER — Other Ambulatory Visit: Payer: Self-pay

## 2019-02-21 ENCOUNTER — Ambulatory Visit
Admission: RE | Admit: 2019-02-21 | Discharge: 2019-02-21 | Disposition: A | Discharge status: Home / Self Care | Payer: Medicare Other | Source: Ambulatory Visit | Attending: Radiation Oncology | Admitting: Radiation Oncology

## 2019-02-21 DIAGNOSIS — Z510 Encounter for antineoplastic radiation therapy: Secondary | ICD-10-CM | POA: Diagnosis not present

## 2019-02-21 DIAGNOSIS — D0512 Intraductal carcinoma in situ of left breast: Secondary | ICD-10-CM | POA: Diagnosis not present

## 2019-02-22 ENCOUNTER — Ambulatory Visit
Admission: RE | Admit: 2019-02-22 | Discharge: 2019-02-22 | Disposition: A | Discharge status: Home / Self Care | Payer: Medicare Other | Source: Ambulatory Visit | Attending: Radiation Oncology | Admitting: Radiation Oncology

## 2019-02-22 ENCOUNTER — Other Ambulatory Visit: Payer: Self-pay

## 2019-02-22 DIAGNOSIS — Z510 Encounter for antineoplastic radiation therapy: Secondary | ICD-10-CM | POA: Diagnosis not present

## 2019-02-22 DIAGNOSIS — D0512 Intraductal carcinoma in situ of left breast: Secondary | ICD-10-CM | POA: Diagnosis not present

## 2019-02-23 ENCOUNTER — Other Ambulatory Visit: Payer: Self-pay

## 2019-02-23 ENCOUNTER — Ambulatory Visit
Admission: RE | Admit: 2019-02-23 | Discharge: 2019-02-23 | Disposition: A | Discharge status: Home / Self Care | Payer: Medicare Other | Source: Ambulatory Visit | Attending: Radiation Oncology | Admitting: Radiation Oncology

## 2019-02-23 DIAGNOSIS — D0512 Intraductal carcinoma in situ of left breast: Secondary | ICD-10-CM | POA: Diagnosis not present

## 2019-02-23 DIAGNOSIS — Z510 Encounter for antineoplastic radiation therapy: Secondary | ICD-10-CM | POA: Diagnosis not present

## 2019-02-24 ENCOUNTER — Ambulatory Visit
Admission: RE | Admit: 2019-02-24 | Discharge: 2019-02-24 | Disposition: A | Discharge status: Home / Self Care | Payer: Medicare Other | Source: Ambulatory Visit | Attending: Radiation Oncology | Admitting: Radiation Oncology

## 2019-02-24 ENCOUNTER — Other Ambulatory Visit: Payer: Self-pay

## 2019-02-24 DIAGNOSIS — Z510 Encounter for antineoplastic radiation therapy: Secondary | ICD-10-CM | POA: Diagnosis not present

## 2019-02-24 DIAGNOSIS — D0512 Intraductal carcinoma in situ of left breast: Secondary | ICD-10-CM | POA: Diagnosis not present

## 2019-02-28 ENCOUNTER — Other Ambulatory Visit: Payer: Self-pay

## 2019-02-28 ENCOUNTER — Ambulatory Visit
Admission: RE | Admit: 2019-02-28 | Discharge: 2019-02-28 | Disposition: A | Discharge status: Home / Self Care | Payer: Medicare Other | Source: Ambulatory Visit | Attending: Radiation Oncology | Admitting: Radiation Oncology

## 2019-02-28 DIAGNOSIS — Z510 Encounter for antineoplastic radiation therapy: Secondary | ICD-10-CM | POA: Diagnosis not present

## 2019-02-28 DIAGNOSIS — D0512 Intraductal carcinoma in situ of left breast: Secondary | ICD-10-CM | POA: Diagnosis not present

## 2019-03-01 ENCOUNTER — Ambulatory Visit
Admission: RE | Admit: 2019-03-01 | Discharge: 2019-03-01 | Disposition: A | Discharge status: Home / Self Care | Payer: Medicare Other | Source: Ambulatory Visit | Attending: Radiation Oncology | Admitting: Radiation Oncology

## 2019-03-01 ENCOUNTER — Other Ambulatory Visit: Payer: Self-pay

## 2019-03-01 DIAGNOSIS — Z510 Encounter for antineoplastic radiation therapy: Secondary | ICD-10-CM | POA: Diagnosis not present

## 2019-03-01 DIAGNOSIS — D0512 Intraductal carcinoma in situ of left breast: Secondary | ICD-10-CM | POA: Diagnosis not present

## 2019-03-02 ENCOUNTER — Other Ambulatory Visit: Payer: Self-pay

## 2019-03-02 ENCOUNTER — Ambulatory Visit
Admission: RE | Admit: 2019-03-02 | Discharge: 2019-03-02 | Disposition: A | Discharge status: Home / Self Care | Payer: Medicare Other | Source: Ambulatory Visit | Attending: Radiation Oncology | Admitting: Radiation Oncology

## 2019-03-02 DIAGNOSIS — Z510 Encounter for antineoplastic radiation therapy: Secondary | ICD-10-CM | POA: Diagnosis not present

## 2019-03-02 DIAGNOSIS — D0512 Intraductal carcinoma in situ of left breast: Secondary | ICD-10-CM | POA: Diagnosis not present

## 2019-03-03 ENCOUNTER — Ambulatory Visit
Admission: RE | Admit: 2019-03-03 | Discharge: 2019-03-03 | Disposition: A | Discharge status: Home / Self Care | Payer: Medicare Other | Source: Ambulatory Visit | Attending: Radiation Oncology | Admitting: Radiation Oncology

## 2019-03-03 ENCOUNTER — Other Ambulatory Visit: Payer: Self-pay

## 2019-03-03 DIAGNOSIS — D0512 Intraductal carcinoma in situ of left breast: Secondary | ICD-10-CM | POA: Diagnosis not present

## 2019-03-03 DIAGNOSIS — Z510 Encounter for antineoplastic radiation therapy: Secondary | ICD-10-CM | POA: Diagnosis not present

## 2019-03-07 ENCOUNTER — Ambulatory Visit
Admission: RE | Admit: 2019-03-07 | Discharge: 2019-03-07 | Disposition: A | Discharge status: Home / Self Care | Payer: Medicare Other | Source: Ambulatory Visit | Attending: Radiation Oncology | Admitting: Radiation Oncology

## 2019-03-07 ENCOUNTER — Other Ambulatory Visit: Payer: Self-pay

## 2019-03-07 DIAGNOSIS — Z510 Encounter for antineoplastic radiation therapy: Secondary | ICD-10-CM | POA: Insufficient documentation

## 2019-03-07 DIAGNOSIS — D0512 Intraductal carcinoma in situ of left breast: Secondary | ICD-10-CM | POA: Insufficient documentation

## 2019-03-08 ENCOUNTER — Other Ambulatory Visit: Payer: Self-pay

## 2019-03-08 ENCOUNTER — Ambulatory Visit
Admission: RE | Admit: 2019-03-08 | Discharge: 2019-03-08 | Disposition: A | Discharge status: Home / Self Care | Payer: Medicare Other | Source: Ambulatory Visit | Attending: Radiation Oncology | Admitting: Radiation Oncology

## 2019-03-08 ENCOUNTER — Ambulatory Visit: Payer: Medicare Other | Admitting: Radiation Oncology

## 2019-03-08 DIAGNOSIS — D0512 Intraductal carcinoma in situ of left breast: Secondary | ICD-10-CM | POA: Diagnosis not present

## 2019-03-08 DIAGNOSIS — Z510 Encounter for antineoplastic radiation therapy: Secondary | ICD-10-CM | POA: Diagnosis not present

## 2019-03-09 ENCOUNTER — Other Ambulatory Visit: Payer: Self-pay

## 2019-03-09 ENCOUNTER — Ambulatory Visit
Admission: RE | Admit: 2019-03-09 | Discharge: 2019-03-09 | Disposition: A | Discharge status: Home / Self Care | Payer: Medicare Other | Source: Ambulatory Visit | Attending: Radiation Oncology | Admitting: Radiation Oncology

## 2019-03-09 DIAGNOSIS — D0512 Intraductal carcinoma in situ of left breast: Secondary | ICD-10-CM | POA: Diagnosis not present

## 2019-03-09 DIAGNOSIS — Z510 Encounter for antineoplastic radiation therapy: Secondary | ICD-10-CM | POA: Diagnosis not present

## 2019-03-10 ENCOUNTER — Other Ambulatory Visit: Payer: Self-pay

## 2019-03-10 ENCOUNTER — Ambulatory Visit
Admission: RE | Admit: 2019-03-10 | Discharge: 2019-03-10 | Disposition: A | Discharge status: Home / Self Care | Payer: Medicare Other | Source: Ambulatory Visit | Attending: Radiation Oncology | Admitting: Radiation Oncology

## 2019-03-10 DIAGNOSIS — Z510 Encounter for antineoplastic radiation therapy: Secondary | ICD-10-CM | POA: Diagnosis not present

## 2019-03-10 DIAGNOSIS — D0512 Intraductal carcinoma in situ of left breast: Secondary | ICD-10-CM | POA: Diagnosis not present

## 2019-03-11 ENCOUNTER — Other Ambulatory Visit: Payer: Self-pay

## 2019-03-11 ENCOUNTER — Ambulatory Visit
Admission: RE | Admit: 2019-03-11 | Discharge: 2019-03-11 | Disposition: A | Discharge status: Home / Self Care | Payer: Medicare Other | Source: Ambulatory Visit | Attending: Radiation Oncology | Admitting: Radiation Oncology

## 2019-03-11 DIAGNOSIS — Z510 Encounter for antineoplastic radiation therapy: Secondary | ICD-10-CM | POA: Diagnosis not present

## 2019-03-11 DIAGNOSIS — D0512 Intraductal carcinoma in situ of left breast: Secondary | ICD-10-CM | POA: Diagnosis not present

## 2019-03-13 NOTE — Progress Notes (Signed)
Patient Care Team: Ronita Hipps, MD as PCP - General (Family Medicine)  DIAGNOSIS:    ICD-10-CM   1. Ductal carcinoma in situ (DCIS) of left breast  D05.12     SUMMARY OF ONCOLOGIC HISTORY: Oncology History  Ductal carcinoma in situ (DCIS) of left breast  12/09/2018 Initial Diagnosis   Routine screening mammogram detected left breast calcifications spanning 1.8cm in the UOQ, and an additional group of calcifications in the UOQ spanning 0.4cm. Biopsy DCIS with calcifications, high grade, ER+ 100%, PR+ 10%.    01/04/2019 Surgery   Left lumpectomy ([facility]): high grade DCIS, 2.0cm, involved margins.    01/18/2019 Surgery   Left lumpectomy re-excision ([facility]): medial and inferior margin negative for in situ or invasive carcinoma   02/18/2019 -  Radiation Therapy   Adjuvant radiation therapy     CHIEF COMPLIANT: Follow-up to discuss anti-estrogen therapy  INTERVAL HISTORY: Andrea [name] is a 74 y.o. with above-mentioned history of left breast DCIS who underwent a left lumpectomy followed by re-excision and is currently undergoing radiation treatment. She presents to the clinic today to discuss anti-estrogen therapy.   ALLERGIES:  is allergic to cortisone.  MEDICATIONS:  Current Outpatient Medications  Medication Sig Dispense Refill  . Ascorbic Acid (VITAMIN C) 1000 MG tablet Take 1,000 mg by mouth daily.     Marland Kitchen aspirin EC 81 MG tablet Take 81 mg by mouth. TAKES ONLY TWICE PER WEKK    . AZO-CRANBERRY PO Take by mouth.    . beta carotene 10000 UNIT capsule Take 10,000 Units by mouth daily.     . Calcium Carb-Cholecalciferol (CALCIUM-VITAMIN D) 500-200 MG-UNIT tablet Take 1 tablet by mouth daily.     . carvedilol (COREG) 6.25 MG tablet TAKE 1/2 TABLET BY MOUTH TWICE DAILY 180 tablet 0  . estradiol (ESTRACE) 0.1 MG/GM vaginal cream Place 1 Applicatorful vaginally at bedtime.    . gabapentin (NEURONTIN) 100 MG capsule Take 1 capsule (100 mg total) by mouth at bedtime. 30  capsule 3  . HYDROcodone-acetaminophen (NORCO/VICODIN) 5-325 MG tablet Take 1 tablet by mouth every 6 (six) hours as needed for moderate pain. (Patient not taking: Reported on 02/07/2019) 15 tablet 0  . Magnesium 300 MG CAPS Take by mouth.    . Multiple Vitamins-Minerals (OCUVITE PO) Take 1 tablet by mouth daily.     . Omega-3 1000 MG CAPS Take 2,000 mg by mouth 2 (two) times daily.    Marland Kitchen omeprazole (PRILOSEC) 40 MG capsule Take 20 mg by mouth daily.     . Probiotic CAPS Take 1 capsule by mouth daily.     . vitamin B-12 (CYANOCOBALAMIN) 1000 MCG tablet Take 1,000 mcg by mouth daily.     Marland Kitchen zinc gluconate 50 MG tablet Take 50 mg by mouth daily.      No current facility-administered medications for this visit.    PHYSICAL EXAMINATION: ECOG PERFORMANCE STATUS: 1 - Symptomatic but completely ambulatory  There were no vitals filed for this visit. There were no vitals filed for this visit.  LABORATORY DATA:  I have reviewed the data as listed CMP Latest Ref Rng & Units 12/31/2018 11/03/2018 04/27/2018  Glucose 70 - 99 mg/dL 116(H) 102(H) 100(H)  BUN 8 - 23 mg/dL 14 12 14   Creatinine 0.44 - 1.00 mg/dL 0.80 0.72 0.87  Sodium 135 - 145 mmol/L 140 142 140  Potassium 3.5 - 5.1 mmol/L 4.4 4.4 4.6  Chloride 98 - 111 mmol/L 105 103 100  CO2 22 - 32  mmol/L 27 26 27   Calcium 8.9 - 10.3 mg/dL 9.5 9.7 9.7  Total Protein 6.5 - 8.1 g/dL 6.8 6.8 7.1  Total Bilirubin 0.3 - 1.2 mg/dL 0.6 0.4 0.4  Alkaline Phos 38 - 126 U/L 49 59 61  AST 15 - 41 U/L 20 17 21   ALT 0 - 44 U/L 16 13 17     Lab Results  Component Value Date   WBC 4.1 12/31/2018   HGB 13.7 12/31/2018   HCT 42.0 12/31/2018   MCV 95.7 12/31/2018   PLT 224 12/31/2018   NEUTROABS 2.2 12/31/2018    ASSESSMENT & PLAN:  Ductal carcinoma in situ (DCIS) of left breast Left lumpectomy: DCIS high-grade 2 cm, focally present at the inferior/medial junction margin ER 100%, PR 10%, Tis NX stage 0 Adjuvant radiation: 02/18/2019-03/14/2019  Treatment  plan: Adjuvant tamoxifen 20 mg daily x5 years (we will start her at 5 mg daily ) Tamoxifen counseling:We discussed the risks and benefits of tamoxifen. These include but not limited to insomnia, hot flashes, mood changes, vaginal dryness, and weight gain. Although rare, serious side effects including endometrial cancer, risk of blood clots were also discussed. We strongly believe that the benefits far outweigh the risks. Patient understands these risks and consented to starting treatment. Planned treatment duration is 5 years.  Severe hot flashes: I switched her to gabapentin.  She is doing remarkably well from hot flashes standpoint. She was very reluctant to consider tamoxifen but we decided to start her at 5 mg. Return to clinic in 3 months for survivorship care plan visit    No orders of the defined types were placed in this encounter.  The patient has a good understanding of the overall plan. she agrees with it. she will call with any problems that may develop before the next visit here.  Total time spent: 15 mins including face to face time and time spent for planning, charting and coordination of care  Nicholas Lose, MD 03/14/2019  I, Cloyde Reams Dorshimer, am acting as scribe for Dr. Nicholas Lose.  I have reviewed the above documentation for accuracy and completeness, and I agree with the above.

## 2019-03-14 ENCOUNTER — Telehealth: Payer: Self-pay | Admitting: Adult Health

## 2019-03-14 ENCOUNTER — Ambulatory Visit
Admission: RE | Admit: 2019-03-14 | Discharge: 2019-03-14 | Disposition: A | Discharge status: Home / Self Care | Payer: Medicare Other | Source: Ambulatory Visit | Attending: Radiation Oncology | Admitting: Radiation Oncology

## 2019-03-14 ENCOUNTER — Inpatient Hospital Stay: Payer: Medicare Other | Attending: Hematology and Oncology | Admitting: Hematology and Oncology

## 2019-03-14 ENCOUNTER — Other Ambulatory Visit: Payer: Self-pay

## 2019-03-14 DIAGNOSIS — Z170 Estrogen receptor positive status [ER+]: Secondary | ICD-10-CM | POA: Diagnosis not present

## 2019-03-14 DIAGNOSIS — R232 Flushing: Secondary | ICD-10-CM | POA: Diagnosis not present

## 2019-03-14 DIAGNOSIS — Z79899 Other long term (current) drug therapy: Secondary | ICD-10-CM | POA: Diagnosis not present

## 2019-03-14 DIAGNOSIS — D0512 Intraductal carcinoma in situ of left breast: Secondary | ICD-10-CM | POA: Insufficient documentation

## 2019-03-14 DIAGNOSIS — Z79810 Long term (current) use of selective estrogen receptor modulators (SERMs): Secondary | ICD-10-CM | POA: Diagnosis not present

## 2019-03-14 DIAGNOSIS — Z923 Personal history of irradiation: Secondary | ICD-10-CM | POA: Insufficient documentation

## 2019-03-14 DIAGNOSIS — Z510 Encounter for antineoplastic radiation therapy: Secondary | ICD-10-CM | POA: Diagnosis not present

## 2019-03-14 DIAGNOSIS — Z7982 Long term (current) use of aspirin: Secondary | ICD-10-CM | POA: Insufficient documentation

## 2019-03-14 MED ORDER — TAMOXIFEN CITRATE 10 MG PO TABS: 5.0000 mg | ORAL_TABLET | Freq: Every day | ORAL | 2 refills | Status: DC

## 2019-03-14 NOTE — Telephone Encounter (Signed)
I left a message regarding schedule  

## 2019-03-14 NOTE — Assessment & Plan Note (Signed)
Left lumpectomy: DCIS high-grade 2 cm, focally present at the inferior/medial junction margin ER 100%, PR 10%, Tis NX stage 0 Adjuvant radiation: 02/18/2019-03/14/2019  Treatment plan: Adjuvant tamoxifen 20 mg daily x5 years (we will start her at 10 mg daily and increase to 20 if she tolerates it) Tamoxifen counseling:We discussed the risks and benefits of tamoxifen. These include but not limited to insomnia, hot flashes, mood changes, vaginal dryness, and weight gain. Although rare, serious side effects including endometrial cancer, risk of blood clots were also discussed. We strongly believe that the benefits far outweigh the risks. Patient understands these risks and consented to starting treatment. Planned treatment duration is 5 years.  Severe hot flashes: I switched her to gabapentin. Return to clinic in 3 months for survivorship care plan visit

## 2019-03-15 ENCOUNTER — Other Ambulatory Visit: Payer: Self-pay

## 2019-03-15 ENCOUNTER — Other Ambulatory Visit: Payer: Self-pay | Admitting: Cardiology

## 2019-03-15 ENCOUNTER — Ambulatory Visit
Admission: RE | Admit: 2019-03-15 | Discharge: 2019-03-15 | Disposition: A | Discharge status: Home / Self Care | Payer: Medicare Other | Source: Ambulatory Visit | Attending: Radiation Oncology | Admitting: Radiation Oncology

## 2019-03-15 DIAGNOSIS — Z510 Encounter for antineoplastic radiation therapy: Secondary | ICD-10-CM | POA: Diagnosis not present

## 2019-03-15 DIAGNOSIS — D0512 Intraductal carcinoma in situ of left breast: Secondary | ICD-10-CM | POA: Diagnosis not present

## 2019-03-16 ENCOUNTER — Other Ambulatory Visit: Payer: Self-pay

## 2019-03-16 ENCOUNTER — Ambulatory Visit
Admission: RE | Admit: 2019-03-16 | Discharge: 2019-03-16 | Disposition: A | Discharge status: Home / Self Care | Payer: Medicare Other | Source: Ambulatory Visit | Attending: Radiation Oncology | Admitting: Radiation Oncology

## 2019-03-16 DIAGNOSIS — D0512 Intraductal carcinoma in situ of left breast: Secondary | ICD-10-CM | POA: Diagnosis not present

## 2019-03-16 DIAGNOSIS — Z510 Encounter for antineoplastic radiation therapy: Secondary | ICD-10-CM | POA: Diagnosis not present

## 2019-03-17 ENCOUNTER — Ambulatory Visit
Admission: RE | Admit: 2019-03-17 | Discharge: 2019-03-17 | Disposition: A | Discharge status: Home / Self Care | Payer: Medicare Other | Source: Ambulatory Visit | Attending: Radiation Oncology | Admitting: Radiation Oncology

## 2019-03-17 ENCOUNTER — Other Ambulatory Visit: Payer: Self-pay

## 2019-03-17 DIAGNOSIS — D0512 Intraductal carcinoma in situ of left breast: Secondary | ICD-10-CM | POA: Diagnosis not present

## 2019-03-17 DIAGNOSIS — Z510 Encounter for antineoplastic radiation therapy: Secondary | ICD-10-CM | POA: Diagnosis not present

## 2019-03-18 ENCOUNTER — Encounter: Payer: Self-pay | Admitting: Radiation Oncology

## 2019-03-18 ENCOUNTER — Ambulatory Visit
Admission: RE | Admit: 2019-03-18 | Discharge: 2019-03-18 | Disposition: A | Discharge status: Home / Self Care | Payer: Medicare Other | Source: Ambulatory Visit | Attending: Radiation Oncology | Admitting: Radiation Oncology

## 2019-03-18 ENCOUNTER — Encounter: Payer: Self-pay | Admitting: *Deleted

## 2019-03-18 ENCOUNTER — Other Ambulatory Visit: Payer: Self-pay

## 2019-03-18 DIAGNOSIS — D0512 Intraductal carcinoma in situ of left breast: Secondary | ICD-10-CM | POA: Diagnosis not present

## 2019-03-18 DIAGNOSIS — Z510 Encounter for antineoplastic radiation therapy: Secondary | ICD-10-CM | POA: Diagnosis not present

## 2019-03-23 NOTE — Progress Notes (Incomplete)
  Patient Name: Andrea [name] MRN: [id number] DOB: 09-05-1945 Referring Physician: Kennith Maes (Profile Not Attached) Date of Service: 03/18/2019 [facility], [place]                                                        End Of Treatment Note  Diagnoses: D05.12-Intraductal carcinoma in situ of left breast  Cancer Staging: Stage0 LeftBreast UOQ,DuctalCarcinoma In Situ, ER+/ PR+, Grade3  Intent: Curative  Radiation Treatment Dates: 02/17/2019 through 03/18/2019 Site Technique Total Dose (Gy) Dose per Fx (Gy) Completed Fx Beam Energies  Breast, Left: Breast_Lt 3D 40.05/40.05 2.67 15/15 6X  Breast, Left: Breast_Lt_Bst 3D 10/10 2 5/5 6X, 10X   Narrative: The patient tolerated radiation therapy relatively well. Patient reported moderate to severe fatigue throughout treatment along with occasional left breast pain that was relieved by APAP or homeopathic medication. During treatment, left breast was slightly hyperpigmented with scant radiation dermatitis.  Plan: The patient will follow-up with radiation oncology in one month.  ________________________________________________   Blair Promise, PhD, MD  This document serves as a record of services personally performed by Gery Pray, MD. It was created on his behalf by Clerance Lav, a trained medical scribe. The creation of this record is based on the scribe's personal observations and the provider's statements to them. This document has been checked and approved by the attending provider.

## 2019-04-18 ENCOUNTER — Other Ambulatory Visit: Payer: Self-pay

## 2019-04-18 ENCOUNTER — Encounter: Payer: Self-pay | Admitting: Radiation Oncology

## 2019-04-18 ENCOUNTER — Ambulatory Visit
Admission: RE | Admit: 2019-04-18 | Discharge: 2019-04-18 | Disposition: A | Discharge status: Home / Self Care | Payer: Medicare Other | Source: Ambulatory Visit | Attending: Radiation Oncology | Admitting: Radiation Oncology

## 2019-04-18 VITALS — BP 149/83 | HR 78 | Temp 98.3°F | Resp 18 | Ht 60.0 in | Wt 141.2 lb

## 2019-04-18 DIAGNOSIS — D0512 Intraductal carcinoma in situ of left breast: Secondary | ICD-10-CM | POA: Diagnosis not present

## 2019-04-18 DIAGNOSIS — Z923 Personal history of irradiation: Secondary | ICD-10-CM | POA: Insufficient documentation

## 2019-04-18 DIAGNOSIS — Z7982 Long term (current) use of aspirin: Secondary | ICD-10-CM | POA: Insufficient documentation

## 2019-04-18 DIAGNOSIS — Z79899 Other long term (current) drug therapy: Secondary | ICD-10-CM | POA: Insufficient documentation

## 2019-04-18 DIAGNOSIS — Z170 Estrogen receptor positive status [ER+]: Secondary | ICD-10-CM | POA: Diagnosis not present

## 2019-04-18 NOTE — Patient Instructions (Signed)
Coronavirus (COVID-19) Are you at risk?  Are you at risk for the Coronavirus (COVID-19)?  To be considered HIGH RISK for Coronavirus (COVID-19), you have to meet the following criteria:  . Traveled to [place], [place], [place], [place] or [place]; or in the [place] to [place], [place], [place], or [place]; and have fever, cough, and shortness of breath within the last 2 weeks of travel OR . Been in close contact with a person diagnosed with COVID-19 within the last 2 weeks and have fever, cough, and shortness of breath . IF YOU DO NOT MEET THESE CRITERIA, YOU ARE CONSIDERED LOW RISK FOR COVID-19.  What to do if you are HIGH RISK for COVID-19?  . If you are having a medical emergency, call 911. . Seek medical care right away. Before you go to a doctor's office, urgent care or emergency department, call ahead and tell them about your recent travel, contact with someone diagnosed with COVID-19, and your symptoms. You should receive instructions from your physician's office regarding next steps of care.  . When you arrive at healthcare provider, tell the healthcare staff immediately you have returned from visiting [place], [place], [place], [place] or [place]; or traveled in the [place] to [place], [place], [place], or [place]; in the last two weeks or you have been in close contact with a person diagnosed with COVID-19 in the last 2 weeks.   . Tell the health care staff about your symptoms: fever, cough and shortness of breath. . After you have been seen by a medical provider, you will be either: o Tested for (COVID-19) and discharged home on quarantine except to seek medical care if symptoms worsen, and asked to  - Stay home and avoid contact with others until you get your results (4-5 days)  - Avoid travel on public transportation if possible (such as bus, train, or airplane) or o Sent to the Emergency Department by EMS for evaluation, COVID-19 testing, and possible  admission depending on your condition and test results.  What to do if you are LOW RISK for COVID-19?  Reduce your risk of any infection by using the same precautions used for avoiding the common cold or flu:  . Wash your hands often with soap and warm water for at least 20 seconds.  If soap and water are not readily available, use an alcohol-based hand sanitizer with at least 60% alcohol.  . If coughing or sneezing, cover your mouth and nose by coughing or sneezing into the elbow areas of your shirt or coat, into a tissue or into your sleeve (not your hands). . Avoid shaking hands with others and consider head nods or verbal greetings only. . Avoid touching your eyes, nose, or mouth with unwashed hands.  . Avoid close contact with people who are sick. . Avoid places or events with large numbers of people in one location, like concerts or sporting events. . Carefully consider travel plans you have or are making. . If you are planning any travel outside or inside the [place], visit the CDC's Travelers' Health webpage for the latest health notices. . If you have some symptoms but not all symptoms, continue to monitor at home and seek medical attention if your symptoms worsen. . If you are having a medical emergency, call 911.   ADDITIONAL HEALTHCARE OPTIONS FOR PATIENTS  [facility] / e-Visit: [email or web address]         [facility]: [phone]  [facility]: [phone]                   [facility]: [phone]   

## 2019-04-18 NOTE — Progress Notes (Signed)
Radiation Oncology         [phone] ________________________________  Name: Andrea [name] MRN: [id number]  Date: 04/18/2019  DOB: [birth year]  Follow-Up Visit Note  CC: Ronita Hipps, MD  Ronita Hipps, MD    ICD-10-CM   1. Ductal carcinoma in situ (DCIS) of left breast  D05.12     Diagnosis: Stage0 LeftBreast UOQ,DuctalCarcinoma In Situ, ER+/ PR+, Grade3  Interval Since Last Radiation: One month.  Radiation Treatment Dates: 02/17/2019 through 03/18/2019 Site Technique Total Dose (Gy) Dose per Fx (Gy) Completed Fx Beam Energies  Breast, Left: Breast_Lt 3D 40.05/40.05 2.67 15/15 6X  Breast, Left: Breast_Lt_Bst 3D 10/10 2 5/5 6X, 10X    Narrative:  The patient returns today for routine follow-up. No significant interval history since the end of treatment. However, the patient was seen by Dr. Lindi Adie on 03/14/2019, during which time she was started on Adjuvant Tamoxifen 5mg  for five years.  On review of systems, the patient reports no nipple discharge or bleeding. The patient denies pain within the breast area.  She denies any residual fatigue.  ALLERGIES:  is allergic to cortisone.  Meds: Current Outpatient Medications  Medication Sig Dispense Refill  . Ascorbic Acid (VITAMIN C) 1000 MG tablet Take 1,000 mg by mouth daily.     Marland Kitchen aspirin EC 81 MG tablet Take 81 mg by mouth. TAKES ONLY TWICE PER WEKK    . AZO-CRANBERRY PO Take by mouth.    . beta carotene 10000 UNIT capsule Take 10,000 Units by mouth daily.     . Calcium Carb-Cholecalciferol (CALCIUM-VITAMIN D) 500-200 MG-UNIT tablet Take 1 tablet by mouth daily.     . carvedilol (COREG) 6.25 MG tablet TAKE 1/2 TABLET BY MOUTH TWICE DAILY 180 tablet 0  . estradiol (ESTRACE) 0.1 MG/GM vaginal cream Place 1 Applicatorful vaginally at bedtime.    . gabapentin (NEURONTIN) 100 MG capsule Take 1 capsule (100 mg total) by mouth at bedtime. 30 capsule 3  . Magnesium 300 MG CAPS Take by mouth.    . Multiple  Vitamins-Minerals (OCUVITE PO) Take 1 tablet by mouth daily.     . Omega-3 1000 MG CAPS Take 2,000 mg by mouth 2 (two) times daily.    Marland Kitchen omeprazole (PRILOSEC) 40 MG capsule Take 20 mg by mouth daily.     . Probiotic CAPS Take 1 capsule by mouth daily.     . tamoxifen (NOLVADEX) 10 MG tablet Take 0.5 tablets (5 mg total) by mouth daily. 60 tablet 2  . vitamin B-12 (CYANOCOBALAMIN) 1000 MCG tablet Take 1,000 mcg by mouth daily.     Marland Kitchen zinc gluconate 50 MG tablet Take 50 mg by mouth daily.     Marland Kitchen HYDROcodone-acetaminophen (NORCO/VICODIN) 5-325 MG tablet Take 1 tablet by mouth every 6 (six) hours as needed for moderate pain. (Patient not taking: Reported on 02/07/2019) 15 tablet 0   No current facility-administered medications for this encounter.    Physical Findings: The patient is in no acute distress. Patient is alert and oriented.  height is 5' (1.524 m) and weight is 141 lb 4 oz (64.1 kg). Her temporal temperature is 98.3 F (36.8 C). Her blood pressure is 149/83 (abnormal) and her pulse is 78. Her respiration is 18 and oxygen saturation is 100%. .  No significant changes. Lungs are clear to auscultation bilaterally. Heart has regular rate and rhythm. No palpable cervical, supraclavicular, or axillary adenopathy. Abdomen soft, non-tender, normal bowel sounds. Right breast: No palpable mass, nipple discharge, or  bleeding. Left breast: Patient has some mild hyperpigmentation changes and mild edema in the breast.  No nipple discharge or bleeding.  Some induration at the lumpectomy site consistent with surgical effect.  Lab Findings: Lab Results  Component Value Date   WBC 4.1 12/31/2018   HGB 13.7 12/31/2018   HCT 42.0 12/31/2018   MCV 95.7 12/31/2018   PLT 224 12/31/2018    Radiographic Findings: No results found.  Impression: Stage0 LeftBreast UOQ,DuctalCarcinoma In Situ, ER+/ PR+, Grade3  The patient tolerated her radiation therapy quite well.  No residual side  effects.  Plan: The patient will follow up with radiation oncology prn and is scheduled to see Dr. Lindi Adie on 06/20/2019.  ____________________________________   Blair Promise, PhD, MD  This document serves as a record of services personally performed by Gery Pray, MD. It was created on his behalf by Clerance Lav, a trained medical scribe. The creation of this record is based on the scribe's personal observations and the provider's statements to them. This document has been checked and approved by the attending provider.

## 2019-04-18 NOTE — Progress Notes (Signed)
Patient in for one month follow up. Doing well denies any issues other than a lump in the scar area. She denies any pain or fatigue. Patient is on Tamoxifen.

## 2019-04-29 DIAGNOSIS — Z23 Encounter for immunization: Secondary | ICD-10-CM | POA: Diagnosis not present

## 2019-05-06 ENCOUNTER — Encounter: Payer: Self-pay | Admitting: Cardiology

## 2019-05-06 ENCOUNTER — Other Ambulatory Visit: Payer: Self-pay

## 2019-05-06 ENCOUNTER — Ambulatory Visit (INDEPENDENT_AMBULATORY_CARE_PROVIDER_SITE_OTHER): Payer: Medicare Other | Admitting: Cardiology

## 2019-05-06 VITALS — BP 126/84 | HR 62 | Ht 60.0 in | Wt 137.0 lb

## 2019-05-06 DIAGNOSIS — D0512 Intraductal carcinoma in situ of left breast: Secondary | ICD-10-CM

## 2019-05-06 DIAGNOSIS — I2510 Atherosclerotic heart disease of native coronary artery without angina pectoris: Secondary | ICD-10-CM | POA: Diagnosis not present

## 2019-05-06 DIAGNOSIS — I10 Essential (primary) hypertension: Secondary | ICD-10-CM

## 2019-05-06 DIAGNOSIS — E782 Mixed hyperlipidemia: Secondary | ICD-10-CM

## 2019-05-06 DIAGNOSIS — Z8679 Personal history of other diseases of the circulatory system: Secondary | ICD-10-CM | POA: Diagnosis not present

## 2019-05-06 DIAGNOSIS — Z1329 Encounter for screening for other suspected endocrine disorder: Secondary | ICD-10-CM | POA: Diagnosis not present

## 2019-05-06 NOTE — Progress Notes (Signed)
Cardiology Office Note:    Date:  05/06/2019   ID:  Andrea [name], DOB 1945-12-12, MRN [id number]  PCP:  Ronita Hipps, MD  Cardiologist:  Jenean Lindau, MD   Referring MD: Ronita Hipps, MD    ASSESSMENT:    1. Coronary artery disease involving native coronary artery of native heart without angina pectoris   2. Essential hypertension   3. Mixed hyperlipidemia   4. History of cardiomyopathy   5. Ductal carcinoma in situ (DCIS) of left breast    PLAN:    In order of problems listed above:  1. Secondary prevention stressed with the patient.  Importance of compliance with diet medication stressed and she vocalized understanding.  Importance of regular exercise stressed and she is doing very well with this.  She has good effort tolerance. 2. Essential hypertension: Blood pressure is stable 3. Mixed dyslipidemia: Diet was emphasized and she will have all blood work today including fasting lipids 4. History of cardiomyopathy: Stable at this time.  Echocardiogram was reviewed from 2019 5. Patient will be seen in follow-up appointment in 6 months or earlier if the patient has any concerns    Medication Adjustments/Labs and Tests Ordered: Current medicines are reviewed at length with the patient today.  Concerns regarding medicines are outlined above.  No orders of the defined types were placed in this encounter.  No orders of the defined types were placed in this encounter.    Chief Complaint  Patient presents with  . Follow-up     History of Present Illness:    Andrea [name] is a 74 y.o. female.  Patient has past medical history of coronary artery disease history of cardiomyopathy essential hypertension and dyslipidemia.  She denies any problems at this time and takes care of activities of daily living.  No chest pain orthopnea or PND.  She has been exercising on a regular basis.  At the time of my evaluation, the patient is alert awake oriented and in no  distress.  Past Medical History:  Diagnosis Date  . Cancer ([place]) 11/2018   left breast DCIS  . Cardiomyopathy ([place])   . Complication of anesthesia    states ear drum burst during hysterectomy surgery  . GERD (gastroesophageal reflux disease)   . Headache    r/t sinus  . 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 Hyperlipidemia   . Hypertension   . Palpitations     Past Surgical History:  Procedure Laterality Date  . ABDOMINAL HYSTERECTOMY    . BREAST LUMPECTOMY WITH RADIOACTIVE SEED LOCALIZATION Left 01/04/2019   Procedure: LEFT BREAST LUMPECTOMY WITH RADIOACTIVE SEED LOCALIZATION;  Surgeon: Erroll Luna, MD;  Location: [facility];  Service: General;  Laterality: Left;  . INNER EAR SURGERY    . RE-EXCISION OF BREAST LUMPECTOMY Left 01/18/2019   Procedure: RE-EXCISION OF LEFT BREAST LUMPECTOMY;  Surgeon: Erroll Luna, MD;  Location: [facility];  Service: General;  Laterality: Left;  . ROTATOR CUFF REPAIR Right   . WRIST SURGERY      Current Medications: Current Meds  Medication Sig  . Ascorbic Acid (VITAMIN C) 1000 MG tablet Take 1,000 mg by mouth daily.   Marland Kitchen aspirin EC 81 MG tablet Take 81 mg by mouth. TAKES ONLY TWICE PER WEKK  . AZO-CRANBERRY PO Take by mouth.  . beta carotene 10000 UNIT capsule Take 10,000 Units by mouth daily.   . Calcium Carb-Cholecalciferol (CALCIUM-VITAMIN D)  500-200 MG-UNIT tablet Take 1 tablet by mouth daily.   . carvedilol (COREG) 6.25 MG tablet TAKE 1/2 TABLET BY MOUTH TWICE DAILY  . estradiol (ESTRACE) 0.1 MG/GM vaginal cream Place 1 Applicatorful vaginally at bedtime.  . gabapentin (NEURONTIN) 100 MG capsule Take 1 capsule (100 mg total) by mouth at bedtime.  Marland Kitchen HYDROcodone-acetaminophen (NORCO/VICODIN) 5-325 MG tablet Take 1 tablet by mouth every 6 (six) hours as needed for moderate pain.  . Magnesium 300 MG CAPS Take by mouth.  . Multiple Vitamins-Minerals (OCUVITE PO) Take 1 tablet by mouth daily.    . Omega-3 1000 MG CAPS Take 2,000 mg by mouth 2 (two) times daily.  Marland Kitchen omeprazole (PRILOSEC) 40 MG capsule Take 20 mg by mouth daily.   . Probiotic CAPS Take 1 capsule by mouth daily.   . tamoxifen (NOLVADEX) 10 MG tablet Take 0.5 tablets (5 mg total) by mouth daily.  . vitamin B-12 (CYANOCOBALAMIN) 1000 MCG tablet Take 1,000 mcg by mouth daily.   Marland Kitchen zinc gluconate 50 MG tablet Take 50 mg by mouth daily.      Allergies:   Cortisone   Social History   Socioeconomic History  . Marital status: Divorced    Spouse name: Not on file  . Number of children: Not on file  . Years of education: Not on file  . Highest education level: Not on file  Occupational History  . Not on file  Tobacco Use  . Smoking status: Never Smoker  . Smokeless tobacco: Never Used  Substance and Sexual Activity  . Alcohol use: Yes    Comment: rare  . Drug use: No  . Sexual activity: Not on file  Other Topics Concern  . Not on file  Social History Narrative  . Not on file   Social Determinants of Health   Financial Resource Strain:   . Difficulty of Paying Living Expenses: Not on file  Food Insecurity:   . Worried About Charity fundraiser in the Last Year: Not on file  . Ran Out of Food in the Last Year: Not on file  Transportation Needs:   . Lack of Transportation (Medical): Not on file  . Lack of Transportation (Non-Medical): Not on file  Physical Activity:   . Days of Exercise per Week: Not on file  . Minutes of Exercise per Session: Not on file  Stress:   . Feeling of Stress : Not on file  Social Connections:   . Frequency of Communication with Friends and Family: Not on file  . Frequency of Social Gatherings with Friends and Family: Not on file  . Attends Religious Services: Not on file  . Active Member of Clubs or Organizations: Not on file  . Attends Archivist Meetings: Not on file  . Marital Status: Not on file     Family History: The patient's family history includes  Valvular heart disease in her brother.  ROS:   Please see the history of present illness.    All other systems reviewed and are negative.  EKGs/Labs/Other Studies Reviewed:    The following studies were reviewed today: I discussed my findings with the patient at length.   Recent Labs: 11/03/2018: TSH 0.872 12/31/2018: ALT 16; BUN 14; Creatinine, Ser 0.80; Hemoglobin 13.7; Platelets 224; Potassium 4.4; Sodium 140  Recent Lipid Panel    Component Value Date/Time   CHOL 238 (H) 11/03/2018 0859   TRIG 94 11/03/2018 0859   HDL 92 11/03/2018 0859   CHOLHDL 2.6 11/03/2018  0859   LDLCALC 130 (H) 11/03/2018 0859    Physical Exam:    VS:  BP 126/84   Pulse 62   Ht 5' (1.524 m)   Wt 137 lb (62.1 kg)   SpO2 97%   BMI 26.76 kg/m     Wt Readings from Last 3 Encounters:  05/06/19 137 lb (62.1 kg)  04/18/19 141 lb 4 oz (64.1 kg)  03/14/19 135 lb 14.4 oz (61.6 kg)     GEN: Patient is in no acute distress HEENT: Normal NECK: No JVD; No carotid bruits LYMPHATICS: No lymphadenopathy CARDIAC: Hear sounds regular, 2/6 systolic murmur at the apex. RESPIRATORY:  Clear to auscultation without rales, wheezing or rhonchi  ABDOMEN: Soft, non-tender, non-distended MUSCULOSKELETAL:  No edema; No deformity  SKIN: Warm and dry NEUROLOGIC:  Alert and oriented x 3 PSYCHIATRIC:  Normal affect   Signed, Jenean Lindau, MD  05/06/2019 8:41 AM    [place]

## 2019-05-06 NOTE — Addendum Note (Signed)
Addended by: Truddie Hidden on: 05/06/2019 08:54 AM   Modules accepted: Orders

## 2019-05-06 NOTE — Patient Instructions (Signed)
Medication Instructions:  No medication changes *If you need a refill on your cardiac medications before your next appointment, please call your pharmacy*   Lab Work: You had a BMET, CBC, TSH, LFT and Lipids done today. If you have labs (blood work) drawn today and your tests are completely normal, you will receive your results only by: Marland Kitchen MyChart Message (if you have MyChart) OR . A paper copy in the mail If you have any lab test that is abnormal or we need to change your treatment, we will call you to review the results.   Testing/Procedures: None ordered   Follow-Up: At [facility], you and your health needs are our priority.  As part of our continuing mission to provide you with exceptional heart care, we have created designated Provider Care Teams.  These Care Teams include your primary Cardiologist (physician) and Advanced Practice Providers (APPs -  Physician Assistants and Nurse Practitioners) who all work together to provide you with the care you need, when you need it.  We recommend signing up for the patient portal called "MyChart".  Sign up information is provided on this After Visit Summary.  MyChart is used to connect with patients for Virtual Visits (Telemedicine).  Patients are able to view lab/test results, encounter notes, upcoming appointments, etc.  Non-urgent messages can be sent to your provider as well.   To learn more about what you can do with MyChart, go to [email or web address].    Your next appointment:   6 month(s)  The format for your next appointment:   In Person  Provider:   Jyl Heinz, MD   Other Instructions NA

## 2019-05-07 LAB — BASIC METABOLIC PANEL
BUN/Creatinine Ratio: 22 (ref 12–28)
BUN: 17 mg/dL (ref 8–27)
CO2: 27 mmol/L (ref 20–29)
Calcium: 9.7 mg/dL (ref 8.7–10.3)
Chloride: 102 mmol/L (ref 96–106)
Creatinine, Ser: 0.77 mg/dL (ref 0.57–1.00)
GFR calc Af Amer: 87 mL/min/{1.73_m2} (ref >59)
GFR calc non Af Amer: 76 mL/min/{1.73_m2} (ref >59)
Glucose: 98 mg/dL (ref 65–99)
Potassium: 4.5 mmol/L (ref 3.5–5.2)
Sodium: 144 mmol/L (ref 134–144)

## 2019-05-07 LAB — LIPID PANEL
Chol/HDL Ratio: 2.3 ratio (ref 0.0–4.4)
Cholesterol, Total: 203 mg/dL — ABNORMAL HIGH (ref 100–199)
HDL: 90 mg/dL (ref >39)
LDL Chol Calc (NIH): 93 mg/dL (ref 0–99)
Triglycerides: 117 mg/dL (ref 0–149)
VLDL Cholesterol Cal: 20 mg/dL (ref 5–40)

## 2019-05-07 LAB — CBC WITH DIFFERENTIAL/PLATELET
Basophils Absolute: 0 10*3/uL (ref 0.0–0.2)
Basos: 1 %
EOS (ABSOLUTE): 0.1 10*3/uL (ref 0.0–0.4)
Eos: 3 %
Hematocrit: 40.4 % (ref 34.0–46.6)
Hemoglobin: 13.5 g/dL (ref 11.1–15.9)
Immature Grans (Abs): 0 10*3/uL (ref 0.0–0.1)
Immature Granulocytes: 0 %
Lymphocytes Absolute: 1 10*3/uL (ref 0.7–3.1)
Lymphs: 23 %
MCH: 31.2 pg (ref 26.6–33.0)
MCHC: 33.4 g/dL (ref 31.5–35.7)
MCV: 93 fL (ref 79–97)
Monocytes Absolute: 0.5 10*3/uL (ref 0.1–0.9)
Monocytes: 12 %
Neutrophils Absolute: 2.6 10*3/uL (ref 1.4–7.0)
Neutrophils: 61 %
Platelets: 194 10*3/uL (ref 150–450)
RBC: 4.33 x10E6/uL (ref 3.77–5.28)
RDW: 12.1 % (ref 11.7–15.4)
WBC: 4.2 10*3/uL (ref 3.4–10.8)

## 2019-05-07 LAB — HEPATIC FUNCTION PANEL
ALT: 6 IU/L (ref 0–32)
AST: 17 IU/L (ref 0–40)
Albumin: 4.4 g/dL (ref 3.7–4.7)
Alkaline Phosphatase: 58 IU/L (ref 39–117)
Bilirubin Total: 0.3 mg/dL (ref 0.0–1.2)
Bilirubin, Direct: 0.1 mg/dL (ref 0.00–0.40)
Total Protein: 6.8 g/dL (ref 6.0–8.5)

## 2019-05-07 LAB — TSH: TSH: 0.872 u[IU]/mL (ref 0.450–4.500)

## 2019-05-09 ENCOUNTER — Other Ambulatory Visit: Payer: Self-pay | Admitting: Hematology and Oncology

## 2019-05-23 ENCOUNTER — Telehealth: Payer: Self-pay | Admitting: Adult Health

## 2019-05-23 NOTE — Telephone Encounter (Signed)
R/s per provider. Called and spoke with pt, confirmed 5/7 appt

## 2019-05-27 DIAGNOSIS — Z23 Encounter for immunization: Secondary | ICD-10-CM | POA: Diagnosis not present

## 2019-06-20 ENCOUNTER — Encounter: Payer: Medicare Other | Admitting: Adult Health

## 2019-07-08 ENCOUNTER — Inpatient Hospital Stay: Payer: Medicare Other

## 2019-07-08 ENCOUNTER — Inpatient Hospital Stay: Payer: Medicare Other | Attending: Adult Health | Admitting: Adult Health

## 2019-07-08 ENCOUNTER — Telehealth: Payer: Self-pay

## 2019-07-08 ENCOUNTER — Other Ambulatory Visit: Payer: Self-pay

## 2019-07-08 VITALS — BP 147/92 | HR 84 | Temp 98.7°F | Resp 17 | Ht 60.0 in | Wt 138.8 lb

## 2019-07-08 DIAGNOSIS — Z7982 Long term (current) use of aspirin: Secondary | ICD-10-CM | POA: Insufficient documentation

## 2019-07-08 DIAGNOSIS — Z170 Estrogen receptor positive status [ER+]: Secondary | ICD-10-CM | POA: Insufficient documentation

## 2019-07-08 DIAGNOSIS — D0512 Intraductal carcinoma in situ of left breast: Secondary | ICD-10-CM | POA: Insufficient documentation

## 2019-07-08 DIAGNOSIS — Z79899 Other long term (current) drug therapy: Secondary | ICD-10-CM | POA: Insufficient documentation

## 2019-07-08 DIAGNOSIS — I429 Cardiomyopathy, unspecified: Secondary | ICD-10-CM | POA: Diagnosis not present

## 2019-07-08 DIAGNOSIS — R232 Flushing: Secondary | ICD-10-CM | POA: Insufficient documentation

## 2019-07-08 DIAGNOSIS — K219 Gastro-esophageal reflux disease without esophagitis: Secondary | ICD-10-CM | POA: Insufficient documentation

## 2019-07-08 DIAGNOSIS — M79662 Pain in left lower leg: Secondary | ICD-10-CM | POA: Insufficient documentation

## 2019-07-08 DIAGNOSIS — I10 Essential (primary) hypertension: Secondary | ICD-10-CM | POA: Insufficient documentation

## 2019-07-08 DIAGNOSIS — M79661 Pain in right lower leg: Secondary | ICD-10-CM | POA: Insufficient documentation

## 2019-07-08 DIAGNOSIS — I2510 Atherosclerotic heart disease of native coronary artery without angina pectoris: Secondary | ICD-10-CM

## 2019-07-08 DIAGNOSIS — Z79810 Long term (current) use of selective estrogen receptor modulators (SERMs): Secondary | ICD-10-CM | POA: Insufficient documentation

## 2019-07-08 DIAGNOSIS — E785 Hyperlipidemia, unspecified: Secondary | ICD-10-CM | POA: Diagnosis not present

## 2019-07-08 DIAGNOSIS — Z923 Personal history of irradiation: Secondary | ICD-10-CM | POA: Diagnosis not present

## 2019-07-08 DIAGNOSIS — R252 Cramp and spasm: Secondary | ICD-10-CM | POA: Insufficient documentation

## 2019-07-08 LAB — CBC WITH DIFFERENTIAL (CANCER CENTER ONLY)
Abs Immature Granulocytes: 0.01 10*3/uL (ref 0.00–0.07)
Basophils Absolute: 0 10*3/uL (ref 0.0–0.1)
Basophils Relative: 1 %
Eosinophils Absolute: 0.1 10*3/uL (ref 0.0–0.5)
Eosinophils Relative: 3 %
HCT: 41.3 % (ref 36.0–46.0)
Hemoglobin: 13.5 g/dL (ref 12.0–15.0)
Immature Granulocytes: 0 %
Lymphocytes Relative: 28 %
Lymphs Abs: 1.1 10*3/uL (ref 0.7–4.0)
MCH: 31.5 pg (ref 26.0–34.0)
MCHC: 32.7 g/dL (ref 30.0–36.0)
MCV: 96.5 fL (ref 80.0–100.0)
Monocytes Absolute: 0.5 10*3/uL (ref 0.1–1.0)
Monocytes Relative: 12 %
Neutro Abs: 2.2 10*3/uL (ref 1.7–7.7)
Neutrophils Relative %: 56 %
Platelet Count: 214 10*3/uL (ref 150–400)
RBC: 4.28 MIL/uL (ref 3.87–5.11)
RDW: 12.8 % (ref 11.5–15.5)
WBC Count: 4 10*3/uL (ref 4.0–10.5)
nRBC: 0 % (ref 0.0–0.2)

## 2019-07-08 LAB — CMP (CANCER CENTER ONLY)
ALT: 10 U/L (ref 0–44)
AST: 16 U/L (ref 15–41)
Albumin: 3.7 g/dL (ref 3.5–5.0)
Alkaline Phosphatase: 60 U/L (ref 38–126)
Anion gap: 12 (ref 5–15)
BUN: 11 mg/dL (ref 8–23)
CO2: 28 mmol/L (ref 22–32)
Calcium: 9.4 mg/dL (ref 8.9–10.3)
Chloride: 105 mmol/L (ref 98–111)
Creatinine: 0.82 mg/dL (ref 0.44–1.00)
GFR, Est AFR Am: >60 mL/min (ref >60)
GFR, Estimated: >60 mL/min (ref >60)
Glucose, Bld: 110 mg/dL — ABNORMAL HIGH (ref 70–99)
Potassium: 3.9 mmol/L (ref 3.5–5.1)
Sodium: 145 mmol/L (ref 135–145)
Total Bilirubin: 0.3 mg/dL (ref 0.3–1.2)
Total Protein: 7.2 g/dL (ref 6.5–8.1)

## 2019-07-08 LAB — D-DIMER, QUANTITATIVE: D-Dimer, Quant: 0.29 ug/mL-FEU (ref 0.00–0.50)

## 2019-07-08 MED ORDER — GABAPENTIN 100 MG PO CAPS: 200.0000 mg | ORAL_CAPSULE | Freq: Every day | ORAL | 3 refills | Status: DC

## 2019-07-08 NOTE — Progress Notes (Signed)
SURVIVORSHIP VISIT:  BRIEF ONCOLOGIC HISTORY:  Oncology History  Ductal carcinoma in situ (DCIS) of left breast  12/09/2018 Initial Diagnosis   Routine screening mammogram detected left breast calcifications spanning 1.8cm in the UOQ, and an additional group of calcifications in the UOQ spanning 0.4cm. Biopsy DCIS with calcifications, high grade, ER+ 100%, PR+ 10%.    12/09/2018 Cancer Staging   Staging form: Breast, AJCC 8th Edition - Clinical stage from 12/09/2018: Stage 0 (cTis (DCIS), cN0, cM0, ER+, PR+)    01/04/2019 Surgery   Left lumpectomy ([facility]) [id number]): high grade DCIS, 2.0cm, involved margins. No regional lymph nodes examined.   01/04/2019 Cancer Staging   Staging form: Breast, AJCC 8th Edition - Pathologic stage from 01/04/2019: Stage 0 (pTis (DCIS), pN0, cM0)     01/18/2019 Surgery   Left lumpectomy re-excision ([facility]) [id number]): medial and inferior margin negative for in situ or invasive carcinoma   02/18/2019 - 03/18/2019 Radiation Therapy   The patient initially received a dose of 40.05 Gy in 15 fractions to the breast using whole-breast tangent fields. This was delivered using a 3-D conformal technique. The pt received a boost delivering an additional 10 Gy in 5 fractions using a electron boost with 28meV electrons. The total dose was 50.05 Gy.   03/2019 - 03/2024 Anti-estrogen oral therapy   Tamoxifen     INTERVAL HISTORY:  Andrea [name] to review her survivorship care plan detailing her treatment course for breast cancer, as well as monitoring long-term side effects of that treatment, education regarding health maintenance, screening, and overall wellness and health promotion.     Overall, Andrea [name] reports feeling quite well.  Her main issue is hot flashes.  She is taking Tamoxifen 5mg  daily.  Her hot flashes are worse at night.  She is taking Gabapentin at night at 100mg .  It does not make her too sleepy.  She also has some calf pain bilaterally.  She  denies any swelling in her legs.  She notes that she will take tylenol or curcurmin.    REVIEW OF SYSTEMS:  Review of Systems  Constitutional: Negative for appetite change, chills, fatigue, fever and unexpected weight change.  HENT:   Negative for hearing loss, lump/mass and sore throat.   Eyes: Negative for eye problems and icterus.  Respiratory: Negative for chest tightness, cough and shortness of breath.   Cardiovascular: Negative for chest pain, leg swelling and palpitations.  Gastrointestinal: Negative for abdominal distention, abdominal pain, constipation, diarrhea, nausea and vomiting.  Endocrine: Positive for hot flashes.  Genitourinary: Negative for difficulty urinating.   Musculoskeletal: Positive for myalgias (in bilateral calves). Negative for arthralgias.  Skin: Negative for itching and rash.  Neurological: Negative for dizziness, extremity weakness and numbness.  Hematological: Negative for adenopathy. Does not bruise/bleed easily.  Psychiatric/Behavioral: Negative for depression. The patient is not nervous/anxious.   Breast: Denies any new nodularity, masses, tenderness, nipple changes, or nipple discharge.      ONCOLOGY TREATMENT TEAM:  1. Surgeon:  Dr. Brantley Stage at [facility] 2. Medical Oncologist: Dr. Lindi Adie  3. Radiation Oncologist: Dr. Brantley Stage    PAST MEDICAL/SURGICAL HISTORY:  Past Medical History:  Diagnosis Date  . Cancer ([place]) 11/2018   left breast DCIS  . Cardiomyopathy ([place])   . Complication of anesthesia    states ear drum burst during hysterectomy surgery  . GERD (gastroesophageal reflux disease)   . Headache    r/t sinus  . 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  Hyperlipidemia   . Hypertension   . Palpitations    Past Surgical History:  Procedure Laterality Date  . ABDOMINAL HYSTERECTOMY    . BREAST LUMPECTOMY WITH RADIOACTIVE SEED LOCALIZATION Left 01/04/2019   Procedure: LEFT BREAST LUMPECTOMY WITH RADIOACTIVE  SEED LOCALIZATION;  Surgeon: Erroll Luna, MD;  Location: [facility];  Service: General;  Laterality: Left;  . INNER EAR SURGERY    . RE-EXCISION OF BREAST LUMPECTOMY Left 01/18/2019   Procedure: RE-EXCISION OF LEFT BREAST LUMPECTOMY;  Surgeon: Erroll Luna, MD;  Location: [facility];  Service: General;  Laterality: Left;  . ROTATOR CUFF REPAIR Right   . WRIST SURGERY       ALLERGIES:  Allergies  Allergen Reactions  . Cortisone     States allegic to oral Cortisone, but not to "shot"     CURRENT MEDICATIONS:  Outpatient Encounter Medications as of 07/08/2019  Medication Sig  . Ascorbic Acid (VITAMIN C) 1000 MG tablet Take 1,000 mg by mouth daily.   Marland Kitchen aspirin EC 81 MG tablet Take 81 mg by mouth. TAKES ONLY TWICE PER [facility]KK  . AZO-CRANBERRY PO Take by mouth.  . beta carotene 10000 UNIT capsule Take 10,000 Units by mouth daily.   . Calcium Carb-Cholecalciferol (CALCIUM-VITAMIN D) 500-200 MG-UNIT tablet Take 1 tablet by mouth daily.   . carvedilol (COREG) 6.25 MG tablet TAKE 1/2 TABLET BY MOUTH TWICE DAILY  . estradiol (ESTRACE) 0.1 MG/GM vaginal cream Place 1 Applicatorful vaginally at bedtime.  . gabapentin (NEURONTIN) 100 MG capsule TAKE 1 CAPSULE(100 MG) BY MOUTH AT BEDTIME  . HYDROcodone-acetaminophen (NORCO/VICODIN) 5-325 MG tablet Take 1 tablet by mouth every 6 (six) hours as needed for moderate pain.  . Magnesium 300 MG CAPS Take by mouth.  . Multiple Vitamins-Minerals (OCUVITE PO) Take 1 tablet by mouth daily.   . Omega-3 1000 MG CAPS Take 2,000 mg by mouth 2 (two) times daily.  Marland Kitchen omeprazole (PRILOSEC) 40 MG capsule Take 20 mg by mouth daily.   . Probiotic CAPS Take 1 capsule by mouth daily.   . tamoxifen (NOLVADEX) 10 MG tablet Take 0.5 tablets (5 mg total) by mouth daily.  . vitamin B-12 (CYANOCOBALAMIN) 1000 MCG tablet Take 1,000 mcg by mouth daily.   Marland Kitchen zinc gluconate 50 MG tablet Take 50 mg by mouth daily.    No facility-administered  encounter medications on file as of 07/08/2019.     ONCOLOGIC FAMILY HISTORY:  Family History  Problem Relation Age of Onset  . Valvular heart disease Brother      GENETIC COUNSELING/TESTING: Not at this time  SOCIAL HISTORY:  Social History   Socioeconomic History  . Marital status: Divorced    Spouse name: Not on file  . Number of children: Not on file  . Years of education: Not on file  . Highest education level: Not on file  Occupational History  . Not on file  Tobacco Use  . Smoking status: Never Smoker  . Smokeless tobacco: Never Used  Substance and Sexual Activity  . Alcohol use: Yes    Comment: rare  . Drug use: No  . Sexual activity: Not on file  Other Topics Concern  . Not on file  Social History Narrative  . Not on file   Social Determinants of Health   Financial Resource Strain:   . Difficulty of Paying Living Expenses:   Food Insecurity:   . Worried About Charity fundraiser in the Last Year:   . [employer]  of Food in the Last Year:   Transportation Needs:   . Film/video editor (Medical):   Marland Kitchen Lack of Transportation (Non-Medical):   Physical Activity:   . Days of Exercise per Week:   . Minutes of Exercise per Session:   Stress:   . Feeling of Stress :   Social Connections:   . Frequency of Communication with Friends and Family:   . Frequency of Social Gatherings with Friends and Family:   . Attends Religious Services:   . Active Member of Clubs or Organizations:   . Attends Archivist Meetings:   Marland Kitchen Marital Status:   Intimate Partner Violence:   . Fear of Current or Ex-Partner:   . Emotionally Abused:   Marland Kitchen Physically Abused:   . Sexually Abused:      OBSERVATIONS/OBJECTIVE:  BP (!) 147/92 (BP Location: Left Arm, Patient Position: Sitting)   Pulse 84   Temp 98.7 F (37.1 C)   Resp 17   Ht 5' (1.524 m)   Wt 138 lb 12.8 oz (63 kg)   SpO2 100%   BMI 27.11 kg/m  GENERAL: Patient is a well appearing female in no acute  distress HEENT:  Sclerae anicteric.  Oropharynx clear and moist. No ulcerations or evidence of oropharyngeal candidiasis. Neck is supple.  NODES:  No cervical, supraclavicular, or axillary lymphadenopathy palpated.  BREAST EXAM:  Left breast s/p lumpectomy and radiation, no sign of local recurrence. LUNGS:  Clear to auscultation bilaterally.  No wheezes or rhonchi. HEART:  Regular rate and rhythm. No murmur appreciated. ABDOMEN:  Soft, nontender.  Positive, normoactive bowel sounds. No organomegaly palpated. MSK:  No focal spinal tenderness to palpation. Full range of motion bilaterally in the upper extremities. EXTREMITIES:  No peripheral edema.   SKIN:  Clear with no obvious rashes or skin changes. No nail dyscrasia. NEURO:  Nonfocal. Well oriented.  Appropriate affect.    LABORATORY DATA:  None for this visit.  DIAGNOSTIC IMAGING:  None for this visit.      ASSESSMENT AND PLAN:  Ms.. [name] is a pleasant 74 y.o. female with Stage 0 rleft breast DCIS, ER+/PR+, diagnosed in 12/2018, treated with lumpectomy, adjuvant radiation therapy, and anti-estrogen therapy with Tamoxifen beginning in 03/2019.  She presents to the Survivorship Clinic for our initial meeting and routine follow-up post-completion of treatment for breast cancer.    1. Stage 0 left breast cancer:  Andrea [name] is continuing to recover from definitive treatment for breast cancer. She will follow-up with her medical oncologist, Dr. Lindi Adie in 12/2019 with history and physical exam per surveillance protocol.  She will continue her anti-estrogen therapy with Tamoxifen, see #2. Her mammogram is due 11/2019; orders placed today. Today, a comprehensive survivorship care plan and treatment summary was reviewed with the patient today detailing her breast cancer diagnosis, treatment course, potential late/long-term effects of treatment, appropriate follow-up care with recommendations for the future, and patient education resources.  A  copy of this summary, along with a letter will be sent to the patient's primary care provider via mail/fax/In Basket message after today's visit.    2.  Hot flashes: Recommend Gabapentin increase to 200mg  per evening.  3. Leg cramping: Will get labs with electrolytes and d dimer.  No swelling present, but she does understand that we are ruling out a blood clot.   4. Bone health:She was given education on specific activities to promote bone health.  5. Cancer screening:  Due to Andrea [name]'s history and her age,  she should receive screening for skin cancers, colon cancer, and gynecologic cancers.  The information and recommendations are listed on the patient's comprehensive care plan/treatment summary and were reviewed in detail with the patient.    6. Health maintenance and wellness promotion: Andrea [name] was encouraged to consume 5-7 servings of fruits and vegetables per day. We reviewed the "Nutrition Rainbow" handout, as well as the handout "Take Control of Your Health and Reduce Your Cancer Risk" from the [place].  She was also encouraged to engage in moderate to vigorous exercise for 30 minutes per day most days of the week. We discussed the [employer] fitness program, which is designed for cancer survivors to help them become more physically fit after cancer treatments.  She was instructed to limit her alcohol consumption and continue to abstain from tobacco use.     7. Support services/counseling: It is not uncommon for this period of the patient's cancer care trajectory to be one of many emotions and stressors.  We discussed how this can be increasingly difficult during the times of quarantine and social distancing due to the COVID-19 pandemic.   She was given information regarding our available services and encouraged to contact me with any questions or for help enrolling in any of our support group/programs.    Follow up instructions:    -Return to cancer center  12/2019  -Mammogram due in 11/2019 -Follow up with surgery 08/2019 th -She is welcome to return back to the Survivorship Clinic at any time; no additional follow-up needed at this time.  -Consider referral back to survivorship as a long-term survivor for continued surveillance  The patient was provided an opportunity to ask questions and all were answered. The patient agreed with the plan and demonstrated an understanding of the instructions.   Total encounter time: 30 minutes*  Wilber Bihari, NP 07/08/19 9:21 AM Medical Oncology and Hematology [facility] [place], [place] [address] Tel. [phone]    Fax. [phone]  *Total Encounter Time as defined by the Centers for Medicare and Medicaid Services includes, in addition to the face-to-face time of a patient visit (documented in the note above) non-face-to-face time: obtaining and reviewing outside history, ordering and reviewing medications, tests or procedures, care coordination (communications with other health care professionals or caregivers) and documentation in the medical record.

## 2019-07-08 NOTE — Telephone Encounter (Signed)
RN spoke with patient, patient notified of normal lab results.  No further needs at this time.

## 2019-07-08 NOTE — Telephone Encounter (Signed)
RN left voicemail for patient to call back.  Re: Labs 

## 2019-07-08 NOTE — Telephone Encounter (Signed)
-----   Message from Gardenia Phlegm, NP sent at 07/08/2019  2:22 PM EDT ----- Please call patient.  Labs are normal! ----- Message ----- From: Interface, Lab In [place] Sent: 07/08/2019  12:00 PM EDT To: Gardenia Phlegm, NP

## 2019-07-11 ENCOUNTER — Telehealth: Payer: Self-pay | Admitting: Oncology

## 2019-07-11 NOTE — Telephone Encounter (Signed)
No 5/7 los. No changes made to pt's schedule.

## 2019-07-27 DIAGNOSIS — H259 Unspecified age-related cataract: Secondary | ICD-10-CM | POA: Diagnosis not present

## 2019-07-27 DIAGNOSIS — H5213 Myopia, bilateral: Secondary | ICD-10-CM | POA: Diagnosis not present

## 2019-07-27 DIAGNOSIS — D3132 Benign neoplasm of left choroid: Secondary | ICD-10-CM | POA: Diagnosis not present

## 2019-07-27 DIAGNOSIS — H40013 Open angle with borderline findings, low risk, bilateral: Secondary | ICD-10-CM | POA: Diagnosis not present

## 2019-09-09 ENCOUNTER — Other Ambulatory Visit: Payer: Self-pay | Admitting: Cardiology

## 2019-10-20 ENCOUNTER — Other Ambulatory Visit: Payer: Self-pay | Admitting: Adult Health

## 2019-10-20 DIAGNOSIS — D0512 Intraductal carcinoma in situ of left breast: Secondary | ICD-10-CM

## 2019-11-10 ENCOUNTER — Ambulatory Visit: Payer: Medicare Other | Admitting: Cardiology

## 2019-11-14 ENCOUNTER — Other Ambulatory Visit: Payer: Self-pay | Admitting: Obstetrics and Gynecology

## 2019-11-14 DIAGNOSIS — Z853 Personal history of malignant neoplasm of breast: Secondary | ICD-10-CM

## 2019-11-14 DIAGNOSIS — Z9889 Other specified postprocedural states: Secondary | ICD-10-CM

## 2019-11-14 DIAGNOSIS — Z01419 Encounter for gynecological examination (general) (routine) without abnormal findings: Secondary | ICD-10-CM | POA: Diagnosis not present

## 2019-11-14 DIAGNOSIS — Z6825 Body mass index (BMI) 25.0-25.9, adult: Secondary | ICD-10-CM | POA: Diagnosis not present

## 2019-11-19 ENCOUNTER — Other Ambulatory Visit: Payer: Self-pay | Admitting: Hematology and Oncology

## 2019-11-28 ENCOUNTER — Ambulatory Visit
Admission: RE | Admit: 2019-11-28 | Discharge: 2019-11-28 | Disposition: A | Discharge status: Home / Self Care | Payer: Medicare Other | Source: Ambulatory Visit | Attending: Obstetrics and Gynecology | Admitting: Obstetrics and Gynecology

## 2019-11-28 ENCOUNTER — Other Ambulatory Visit: Payer: Self-pay

## 2019-11-28 DIAGNOSIS — Z853 Personal history of malignant neoplasm of breast: Secondary | ICD-10-CM

## 2019-11-28 DIAGNOSIS — Z9889 Other specified postprocedural states: Secondary | ICD-10-CM

## 2019-11-28 DIAGNOSIS — R928 Other abnormal and inconclusive findings on diagnostic imaging of breast: Secondary | ICD-10-CM | POA: Diagnosis not present

## 2019-11-28 HISTORY — DX: Personal history of irradiation: Z92.3

## 2019-11-28 HISTORY — DX: Malignant neoplasm of unspecified site of unspecified female breast: C50.919

## 2019-12-07 ENCOUNTER — Encounter: Payer: Self-pay | Admitting: Cardiology

## 2019-12-07 ENCOUNTER — Other Ambulatory Visit: Payer: Self-pay

## 2019-12-07 ENCOUNTER — Ambulatory Visit (INDEPENDENT_AMBULATORY_CARE_PROVIDER_SITE_OTHER): Payer: Medicare Other | Admitting: Cardiology

## 2019-12-07 VITALS — BP 133/83 | HR 94 | Ht 61.0 in | Wt 136.8 lb

## 2019-12-07 DIAGNOSIS — I10 Essential (primary) hypertension: Secondary | ICD-10-CM

## 2019-12-07 DIAGNOSIS — I2510 Atherosclerotic heart disease of native coronary artery without angina pectoris: Secondary | ICD-10-CM | POA: Diagnosis not present

## 2019-12-07 DIAGNOSIS — E782 Mixed hyperlipidemia: Secondary | ICD-10-CM | POA: Diagnosis not present

## 2019-12-07 NOTE — Progress Notes (Signed)
Cardiology Office Note:    Date:  12/07/2019   ID:  Andrea [name], [place] [birth year], MRN [id number]  PCP:  Ronita Hipps, MD  Cardiologist:  Jenean Lindau, MD   Referring MD: Ronita Hipps, MD    ASSESSMENT:    1. Coronary artery disease involving native coronary artery of native heart without angina pectoris   2. Essential hypertension   3. Mixed hyperlipidemia    PLAN:    In order of problems listed above:  1. Primary prevention stressed with the patient.  Importance of compliance with diet medication stressed she vocalized understanding.  Her exercise tolerance is excellent and she is exercising well on a regular basis 2. History of cardiomyopathy: Last ejection fraction was within normal limits.  This was discussed also with her at length. 3. Mixed dyslipidemia: Diet was emphasized.  She had blood work this morning and we are awaiting the results.  Diet was discussed at length. 4. Palpitations: They have resolved and she has no palpitations at this time and is happy.   Medication Adjustments/Labs and Tests Ordered: Current medicines are reviewed at length with the patient today.  Concerns regarding medicines are outlined above.  No orders of the defined types were placed in this encounter.  No orders of the defined types were placed in this encounter.    No chief complaint on file.    History of Present Illness:    Andrea [name] is a 74 y.o. female.  Patient has past medical history of cardiomyopathy.  She denies any problems at this time and takes care of activities of daily living.  She is exercising on a regular basis.  At the time of my evaluation, the patient is alert awake oriented and in no distress.  She walks at least half an hour a day 5 days a week.  She also does some other exercises.  Past Medical History:  Diagnosis Date  . Breast cancer ([place])   . Cancer ([place]) 11/2018   left breast DCIS  . Cardiomyopathy ([place])   . Complication of  anesthesia    states ear drum burst during hysterectomy surgery  . GERD (gastroesophageal reflux disease)   . Headache    r/t sinus  . 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 Hyperlipidemia   . Hypertension   . Palpitations   . Personal history of radiation therapy     Past Surgical History:  Procedure Laterality Date  . ABDOMINAL HYSTERECTOMY    . BREAST LUMPECTOMY    . BREAST LUMPECTOMY WITH RADIOACTIVE SEED LOCALIZATION Left 01/04/2019   Procedure: LEFT BREAST LUMPECTOMY WITH RADIOACTIVE SEED LOCALIZATION;  Surgeon: Erroll Luna, MD;  Location: [facility];  Service: General;  Laterality: Left;  . INNER EAR SURGERY    . RE-EXCISION OF BREAST LUMPECTOMY Left 01/18/2019   Procedure: RE-EXCISION OF LEFT BREAST LUMPECTOMY;  Surgeon: Erroll Luna, MD;  Location: [facility];  Service: General;  Laterality: Left;  . ROTATOR CUFF REPAIR Right   . WRIST SURGERY      Current Medications: Current Meds  Medication Sig  . Ascorbic Acid (VITAMIN C) 1000 MG tablet Take 1,000 mg by mouth daily.   Marland Kitchen aspirin 81 MG chewable tablet Chew 81 mg by mouth daily.  . AZO-CRANBERRY PO Take by mouth.  . beta carotene 10000 UNIT capsule Take 10,000 Units by mouth daily.   . Calcium Carb-Cholecalciferol (CALCIUM-VITAMIN D) 500-200 MG-UNIT tablet Take 1  tablet by mouth daily.   . carvedilol (COREG) 6.25 MG tablet TAKE 1/2 TABLET BY MOUTH TWICE DAILY  . gabapentin (NEURONTIN) 100 MG capsule TAKE 2 CAPSULES(200 MG) BY MOUTH AT BEDTIME  . Magnesium 300 MG CAPS Take by mouth.  . Multiple Vitamins-Minerals (OCUVITE PO) Take 1 tablet by mouth daily.   . Omega-3 1000 MG CAPS Take 2,000 mg by mouth 2 (two) times daily.  Marland Kitchen omeprazole (PRILOSEC) 20 MG capsule Take 40 mg by mouth daily.  . Probiotic CAPS Take 1 capsule by mouth daily.   . tamoxifen (NOLVADEX) 10 MG tablet TAKE 1/2 TABLET BY MOUTH DAILY  . vitamin B-12 (CYANOCOBALAMIN) 1000 MCG tablet Take 1,000 mcg  by mouth daily.   Marland Kitchen zinc gluconate 50 MG tablet Take 50 mg by mouth daily.      Allergies:   Cortisone   Social History   Socioeconomic History  . Marital status: Divorced    Spouse name: Not on file  . Number of children: Not on file  . Years of education: Not on file  . Highest education level: Not on file  Occupational History  . Not on file  Tobacco Use  . Smoking status: Never Smoker  . Smokeless tobacco: Never Used  Vaping Use  . Vaping Use: Never used  Substance and Sexual Activity  . Alcohol use: Yes    Comment: rare  . Drug use: No  . Sexual activity: Not on file  Other Topics Concern  . Not on file  Social History Narrative  . Not on file   Social Determinants of Health   Financial Resource Strain:   . Difficulty of Paying Living Expenses: Not on file  Food Insecurity:   . Worried About Charity fundraiser in the Last Year: Not on file  . Ran Out of Food in the Last Year: Not on file  Transportation Needs:   . Lack of Transportation (Medical): Not on file  . Lack of Transportation (Non-Medical): Not on file  Physical Activity:   . Days of Exercise per Week: Not on file  . Minutes of Exercise per Session: Not on file  Stress:   . Feeling of Stress : Not on file  Social Connections:   . Frequency of Communication with Friends and Family: Not on file  . Frequency of Social Gatherings with Friends and Family: Not on file  . Attends Religious Services: Not on file  . Active Member of Clubs or Organizations: Not on file  . Attends Archivist Meetings: Not on file  . Marital Status: Not on file     Family History: The patient's family history includes Valvular heart disease in her brother.  ROS:   Please see the history of present illness.    All other systems reviewed and are negative.  EKGs/Labs/Other Studies Reviewed:    The following studies were reviewed today: EKG reveals sinus rhythm and nonspecific ST-T changes   Recent  Labs: 05/06/2019: TSH 0.872 07/08/2019: ALT 10; BUN 11; Creatinine 0.82; Hemoglobin 13.5; Platelet Count 214; Potassium 3.9; Sodium 145  Recent Lipid Panel    Component Value Date/Time   CHOL 203 (H) 05/06/2019 0853   TRIG 117 05/06/2019 0853   HDL 90 05/06/2019 0853   CHOLHDL 2.3 05/06/2019 0853   LDLCALC 93 05/06/2019 0853    Physical Exam:    VS:  BP 133/83   Pulse 94   Ht 5\' 1"  (1.549 m)   Wt 136 lb 12.8 oz (62.1  kg)   SpO2 98%   BMI 25.85 kg/m     Wt Readings from Last 3 Encounters:  12/07/19 136 lb 12.8 oz (62.1 kg)  07/08/19 138 lb 12.8 oz (63 kg)  05/06/19 137 lb (62.1 kg)     GEN: Patient is in no acute distress HEENT: Normal NECK: No JVD; No carotid bruits LYMPHATICS: No lymphadenopathy CARDIAC: Hear sounds regular, 2/6 systolic murmur at the apex. RESPIRATORY:  Clear to auscultation without rales, wheezing or rhonchi  ABDOMEN: Soft, non-tender, non-distended MUSCULOSKELETAL:  No edema; No deformity  SKIN: Warm and dry NEUROLOGIC:  Alert and oriented x 3 PSYCHIATRIC:  Normal affect   Signed, Jenean Lindau, MD  12/07/2019 1:43 PM    [facility]

## 2019-12-07 NOTE — Addendum Note (Signed)
Addended by: Truddie Hidden on: 12/07/2019 09:16 AM   Modules accepted: Orders

## 2019-12-07 NOTE — Patient Instructions (Signed)

## 2019-12-08 ENCOUNTER — Telehealth: Payer: Self-pay | Admitting: Cardiology

## 2019-12-08 LAB — HEPATIC FUNCTION PANEL
ALT: 10 IU/L (ref 0–32)
AST: 16 IU/L (ref 0–40)
Albumin: 4.1 g/dL (ref 3.7–4.7)
Alkaline Phosphatase: 52 IU/L (ref 44–121)
Bilirubin Total: 0.4 mg/dL (ref 0.0–1.2)
Bilirubin, Direct: 0.11 mg/dL (ref 0.00–0.40)
Total Protein: 6.9 g/dL (ref 6.0–8.5)

## 2019-12-08 LAB — CBC WITH DIFFERENTIAL/PLATELET
Basophils Absolute: 0 10*3/uL (ref 0.0–0.2)
Basos: 1 %
EOS (ABSOLUTE): 0.1 10*3/uL (ref 0.0–0.4)
Eos: 3 %
Hematocrit: 37.9 % (ref 34.0–46.6)
Hemoglobin: 12.9 g/dL (ref 11.1–15.9)
Immature Grans (Abs): 0 10*3/uL (ref 0.0–0.1)
Immature Granulocytes: 0 %
Lymphocytes Absolute: 1.1 10*3/uL (ref 0.7–3.1)
Lymphs: 31 %
MCH: 32.4 pg (ref 26.6–33.0)
MCHC: 34 g/dL (ref 31.5–35.7)
MCV: 95 fL (ref 79–97)
Monocytes Absolute: 0.4 10*3/uL (ref 0.1–0.9)
Monocytes: 13 %
Neutrophils Absolute: 1.8 10*3/uL (ref 1.4–7.0)
Neutrophils: 52 %
Platelets: 199 10*3/uL (ref 150–450)
RBC: 3.98 x10E6/uL (ref 3.77–5.28)
RDW: 12.7 % (ref 11.7–15.4)
WBC: 3.4 10*3/uL (ref 3.4–10.8)

## 2019-12-08 LAB — BASIC METABOLIC PANEL
BUN/Creatinine Ratio: 20 (ref 12–28)
BUN: 17 mg/dL (ref 8–27)
CO2: 24 mmol/L (ref 20–29)
Calcium: 9.4 mg/dL (ref 8.7–10.3)
Chloride: 106 mmol/L (ref 96–106)
Creatinine, Ser: 0.83 mg/dL (ref 0.57–1.00)
GFR calc Af Amer: 79 mL/min/{1.73_m2} (ref >59)
GFR calc non Af Amer: 69 mL/min/{1.73_m2} (ref >59)
Glucose: 108 mg/dL — ABNORMAL HIGH (ref 65–99)
Potassium: 4.4 mmol/L (ref 3.5–5.2)
Sodium: 143 mmol/L (ref 134–144)

## 2019-12-08 LAB — LIPID PANEL
Chol/HDL Ratio: 2.6 ratio (ref 0.0–4.4)
Cholesterol, Total: 222 mg/dL — ABNORMAL HIGH (ref 100–199)
HDL: 86 mg/dL (ref >39)
LDL Chol Calc (NIH): 119 mg/dL — ABNORMAL HIGH (ref 0–99)
Triglycerides: 96 mg/dL (ref 0–149)
VLDL Cholesterol Cal: 17 mg/dL (ref 5–40)

## 2019-12-08 LAB — TSH: TSH: 0.977 u[IU]/mL (ref 0.450–4.500)

## 2019-12-08 NOTE — Telephone Encounter (Signed)
Spoke with patient regarding results and recommendation.  Patient verbalizes understanding and is agreeable to plan of care. Advised patient to call back with any issues or concerns.  

## 2019-12-08 NOTE — Telephone Encounter (Signed)
Patient returned call for lab results. She said a detailed message can be left.

## 2019-12-14 DIAGNOSIS — Z23 Encounter for immunization: Secondary | ICD-10-CM | POA: Diagnosis not present

## 2020-01-10 DIAGNOSIS — Z23 Encounter for immunization: Secondary | ICD-10-CM | POA: Diagnosis not present

## 2020-01-30 ENCOUNTER — Telehealth: Payer: Self-pay

## 2020-01-30 NOTE — Telephone Encounter (Signed)
Pt called and states she only needs to take 1 gabapentin caps at night instead of two, because two capsules "makes me too drowsy the next day, and only one is working fine." This LPN made Wilber Bihari, NP aware who is in agreement with this plan. Pt aware.

## 2020-03-12 ENCOUNTER — Other Ambulatory Visit: Payer: Self-pay | Admitting: Cardiology

## 2020-03-12 NOTE — Telephone Encounter (Signed)
Rx refill sent to pharmacy. 

## 2020-06-05 DIAGNOSIS — H9190 Unspecified hearing loss, unspecified ear: Secondary | ICD-10-CM | POA: Insufficient documentation

## 2020-06-05 DIAGNOSIS — R519 Headache, unspecified: Secondary | ICD-10-CM | POA: Insufficient documentation

## 2020-06-05 DIAGNOSIS — I429 Cardiomyopathy, unspecified: Secondary | ICD-10-CM | POA: Insufficient documentation

## 2020-06-05 DIAGNOSIS — T8859XA Other complications of anesthesia, initial encounter: Secondary | ICD-10-CM | POA: Insufficient documentation

## 2020-06-05 DIAGNOSIS — C50919 Malignant neoplasm of unspecified site of unspecified female breast: Secondary | ICD-10-CM | POA: Insufficient documentation

## 2020-06-05 DIAGNOSIS — R002 Palpitations: Secondary | ICD-10-CM | POA: Insufficient documentation

## 2020-06-05 DIAGNOSIS — Z923 Personal history of irradiation: Secondary | ICD-10-CM | POA: Insufficient documentation

## 2020-06-05 DIAGNOSIS — E785 Hyperlipidemia, unspecified: Secondary | ICD-10-CM | POA: Insufficient documentation

## 2020-06-05 DIAGNOSIS — I10 Essential (primary) hypertension: Secondary | ICD-10-CM | POA: Insufficient documentation

## 2020-06-05 DIAGNOSIS — K219 Gastro-esophageal reflux disease without esophagitis: Secondary | ICD-10-CM | POA: Insufficient documentation

## 2020-06-06 ENCOUNTER — Ambulatory Visit (INDEPENDENT_AMBULATORY_CARE_PROVIDER_SITE_OTHER): Payer: Medicare Other | Admitting: Cardiology

## 2020-06-06 ENCOUNTER — Encounter: Payer: Self-pay | Admitting: Cardiology

## 2020-06-06 ENCOUNTER — Other Ambulatory Visit: Payer: Self-pay

## 2020-06-06 VITALS — BP 144/78 | HR 86 | Ht 61.0 in | Wt 138.4 lb

## 2020-06-06 DIAGNOSIS — I2510 Atherosclerotic heart disease of native coronary artery without angina pectoris: Secondary | ICD-10-CM | POA: Diagnosis not present

## 2020-06-06 DIAGNOSIS — E782 Mixed hyperlipidemia: Secondary | ICD-10-CM | POA: Diagnosis not present

## 2020-06-06 DIAGNOSIS — Z8679 Personal history of other diseases of the circulatory system: Secondary | ICD-10-CM

## 2020-06-06 DIAGNOSIS — I10 Essential (primary) hypertension: Secondary | ICD-10-CM

## 2020-06-06 NOTE — Patient Instructions (Addendum)

## 2020-06-06 NOTE — Progress Notes (Signed)
Cardiology Office Note:    Date:  06/06/2020   ID:  Philomena Doheny, [place] 22-Aug-1945, MRN [id number]  PCP:  Ronita Hipps, MD  Cardiologist:  Jenean Lindau, MD   Referring MD: Ronita Hipps, MD    ASSESSMENT:    1. Coronary artery disease involving native coronary artery of native heart without angina pectoris   2. Essential hypertension   3. Mixed hyperlipidemia   4. History of cardiomyopathy    PLAN:    In order of problems listed above:  1. Primary prevention stressed with the patient.  Importance of compliance with diet medication stressed and she vocalized understanding.  She was advised to walk at least half an hour a day 5 days a week and she promises to do so. 2. Essential hypertension: Blood pressure stable and diet was emphasized.  She has an element of whitecoat hypertension.  Her blood pressures at home are fine. 3. Mixed dyslipidemia: Diet was emphasized.  She is fasting and will have complete blood work today.  Heart healthy diet was mentioned to lower cholesterol 4. Patient will be seen in follow-up appointment in 6 months or earlier if the patient has any concerns    Medication Adjustments/Labs and Tests Ordered: Current medicines are reviewed at length with the patient today.  Concerns regarding medicines are outlined above.  No orders of the defined types were placed in this encounter.  No orders of the defined types were placed in this encounter.    No chief complaint on file.    History of Present Illness:    Andrea [name] is a 75 y.o. female.  Patient has past medical history of cardiomyopathy, essential hypertension dyslipidemia.  Patient denies any problems at this time and takes care of activities of daily living.  No chest pain orthopnea or PND.  She walks on a regular basis.  She is happy with her overall health.  Past Medical History:  Diagnosis Date  . Breast cancer ([place])   . CAD (coronary artery disease), native coronary  artery 12/22/2016  . Cancer ([place]) 11/2018   left breast DCIS  . Cardiomyopathy ([place])   . Carotid bruit 10/27/2017  . Complication of anesthesia    states ear drum burst during hysterectomy surgery  . 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 Essential hypertension 12/28/2014  . GERD (gastroesophageal reflux disease)   . Headache    r/t sinus  . History of cardiomyopathy 10/27/2017  . 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 Hyperlipemia 12/22/2016  . Hyperlipidemia   . Hypertension   . Palpitations   . Personal history of radiation therapy     Past Surgical History:  Procedure Laterality Date  . ABDOMINAL HYSTERECTOMY    . BREAST LUMPECTOMY    . BREAST LUMPECTOMY WITH RADIOACTIVE SEED LOCALIZATION Left 01/04/2019   Procedure: LEFT BREAST LUMPECTOMY WITH RADIOACTIVE SEED LOCALIZATION;  Surgeon: Erroll Luna, MD;  Location: [facility];  Service: General;  Laterality: Left;  . INNER EAR SURGERY    . RE-EXCISION OF BREAST LUMPECTOMY Left 01/18/2019   Procedure: RE-EXCISION OF LEFT BREAST LUMPECTOMY;  Surgeon: Erroll Luna, MD;  Location: [facility];  Service: General;  Laterality: Left;  . ROTATOR CUFF REPAIR Right   . WRIST SURGERY      Current Medications: Current Meds  Medication Sig  . Ascorbic Acid (VITAMIN C) 1000 MG tablet Take 1,000 mg by mouth daily.   Marland Kitchen  aspirin 81 MG chewable tablet Chew 81 mg by mouth daily.  . beta carotene 10000 UNIT capsule Take 10,000 Units by mouth daily.   . Calcium Carb-Cholecalciferol (CALCIUM-VITAMIN D) 500-200 MG-UNIT tablet Take 1 tablet by mouth daily.   . carvedilol (COREG) 6.25 MG tablet Take 0.5 tablets by mouth 2 (two) times daily with a meal.  . CRANBERRY PO Take 1 tablet by mouth daily.  Marland Kitchen gabapentin (NEURONTIN) 100 MG capsule Take 200 mg by mouth at bedtime.  . Magnesium 300 MG CAPS Take 300 mg by mouth daily.  . Multiple Vitamins-Minerals (OCUVITE PO) Take 1 tablet  by mouth daily.   . Omega-3 1000 MG CAPS Take 2,000 mg by mouth 2 (two) times daily.  Marland Kitchen omeprazole (PRILOSEC) 20 MG capsule Take 40 mg by mouth daily.  . Probiotic CAPS Take 1 capsule by mouth daily.   . tamoxifen (NOLVADEX) 10 MG tablet Take 5 mg by mouth daily.  . vitamin B-12 (CYANOCOBALAMIN) 1000 MCG tablet Take 1,000 mcg by mouth daily.   Marland Kitchen zinc gluconate 50 MG tablet Take 50 mg by mouth daily.      Allergies:   Cortisone   Social History   Socioeconomic History  . Marital status: Divorced    Spouse name: Not on file  . Number of children: Not on file  . Years of education: Not on file  . Highest education level: Not on file  Occupational History  . Not on file  Tobacco Use  . Smoking status: Never Smoker  . Smokeless tobacco: Never Used  Vaping Use  . Vaping Use: Never used  Substance and Sexual Activity  . Alcohol use: Yes    Comment: rare  . Drug use: No  . Sexual activity: Not on file  Other Topics Concern  . Not on file  Social History Narrative  . Not on file   Social Determinants of Health   Financial Resource Strain: Not on file  Food Insecurity: Not on file  Transportation Needs: Not on file  Physical Activity: Not on file  Stress: Not on file  Social Connections: Not on file     Family History: The patient's family history includes Valvular heart disease in her brother.  ROS:   Please see the history of present illness.    All other systems reviewed and are negative.  EKGs/Labs/Other Studies Reviewed:    The following studies were reviewed today: I discussed my findings with the patient at length   Recent Labs: 12/07/2019: ALT 10; BUN 17; Creatinine, Ser 0.83; Hemoglobin 12.9; Platelets 199; Potassium 4.4; Sodium 143; TSH 0.977  Recent Lipid Panel    Component Value Date/Time   CHOL 222 (H) 12/07/2019 0914   TRIG 96 12/07/2019 0914   HDL 86 12/07/2019 0914   CHOLHDL 2.6 12/07/2019 0914   LDLCALC 119 (H) 12/07/2019 0914    Physical  Exam:    VS:  BP (!) 144/78   Pulse 86   Ht 5\' 1"  (1.549 m)   Wt 138 lb 6.4 oz (62.8 kg)   SpO2 96%   BMI 26.15 kg/m     Wt Readings from Last 3 Encounters:  06/06/20 138 lb 6.4 oz (62.8 kg)  12/07/19 136 lb 12.8 oz (62.1 kg)  07/08/19 138 lb 12.8 oz (63 kg)     GEN: Patient is in no acute distress HEENT: Normal NECK: No JVD; No carotid bruits LYMPHATICS: No lymphadenopathy CARDIAC: Hear sounds regular, 2/6 systolic murmur at the apex. RESPIRATORY:  Clear  to auscultation without rales, wheezing or rhonchi  ABDOMEN: Soft, non-tender, non-distended MUSCULOSKELETAL:  No edema; No deformity  SKIN: Warm and dry NEUROLOGIC:  Alert and oriented x 3 PSYCHIATRIC:  Normal affect   Signed, Jenean Lind[facility], MD  06/06/2020 8:21 AM    [facility]

## 2020-06-07 LAB — BASIC METABOLIC PANEL
BUN/Creatinine Ratio: 15 (ref 12–28)
BUN: 13 mg/dL (ref 8–27)
CO2: 24 mmol/L (ref 20–29)
Calcium: 9.4 mg/dL (ref 8.7–10.3)
Chloride: 103 mmol/L (ref 96–106)
Creatinine, Ser: 0.86 mg/dL (ref 0.57–1.00)
Glucose: 103 mg/dL — ABNORMAL HIGH (ref 65–99)
Potassium: 4.2 mmol/L (ref 3.5–5.2)
Sodium: 143 mmol/L (ref 134–144)
eGFR: 70 mL/min/{1.73_m2} (ref >59)

## 2020-06-07 LAB — TSH: TSH: 1.29 u[IU]/mL (ref 0.450–4.500)

## 2020-06-07 LAB — CBC WITH DIFFERENTIAL/PLATELET
Basophils Absolute: 0 10*3/uL (ref 0.0–0.2)
Basos: 1 %
EOS (ABSOLUTE): 0.1 10*3/uL (ref 0.0–0.4)
Eos: 3 %
Hematocrit: 38.4 % (ref 34.0–46.6)
Hemoglobin: 12.6 g/dL (ref 11.1–15.9)
Immature Grans (Abs): 0 10*3/uL (ref 0.0–0.1)
Immature Granulocytes: 0 %
Lymphocytes Absolute: 1 10*3/uL (ref 0.7–3.1)
Lymphs: 29 %
MCH: 31.6 pg (ref 26.6–33.0)
MCHC: 32.8 g/dL (ref 31.5–35.7)
MCV: 96 fL (ref 79–97)
Monocytes Absolute: 0.4 10*3/uL (ref 0.1–0.9)
Monocytes: 11 %
Neutrophils Absolute: 1.9 10*3/uL (ref 1.4–7.0)
Neutrophils: 56 %
Platelets: 198 10*3/uL (ref 150–450)
RBC: 3.99 x10E6/uL (ref 3.77–5.28)
RDW: 12.6 % (ref 11.7–15.4)
WBC: 3.4 10*3/uL (ref 3.4–10.8)

## 2020-06-07 LAB — HEPATIC FUNCTION PANEL
ALT: 12 IU/L (ref 0–32)
AST: 16 IU/L (ref 0–40)
Albumin: 4 g/dL (ref 3.7–4.7)
Alkaline Phosphatase: 51 IU/L (ref 44–121)
Bilirubin Total: 0.3 mg/dL (ref 0.0–1.2)
Bilirubin, Direct: <0.1 mg/dL (ref 0.00–0.40)
Total Protein: 6.8 g/dL (ref 6.0–8.5)

## 2020-06-07 LAB — LIPID PANEL
Chol/HDL Ratio: 2.6 ratio (ref 0.0–4.4)
Cholesterol, Total: 224 mg/dL — ABNORMAL HIGH (ref 100–199)
HDL: 86 mg/dL (ref >39)
LDL Chol Calc (NIH): 120 mg/dL — ABNORMAL HIGH (ref 0–99)
Triglycerides: 102 mg/dL (ref 0–149)
VLDL Cholesterol Cal: 18 mg/dL (ref 5–40)

## 2020-08-01 DIAGNOSIS — H259 Unspecified age-related cataract: Secondary | ICD-10-CM | POA: Diagnosis not present

## 2020-08-01 DIAGNOSIS — H40013 Open angle with borderline findings, low risk, bilateral: Secondary | ICD-10-CM | POA: Diagnosis not present

## 2020-08-01 DIAGNOSIS — D3132 Benign neoplasm of left choroid: Secondary | ICD-10-CM | POA: Diagnosis not present

## 2020-08-27 ENCOUNTER — Other Ambulatory Visit: Payer: Self-pay | Admitting: Cardiology

## 2020-08-27 NOTE — Telephone Encounter (Signed)
Rx approved and sent 

## 2020-09-17 DIAGNOSIS — D0512 Intraductal carcinoma in situ of left breast: Secondary | ICD-10-CM | POA: Diagnosis not present

## 2020-09-28 DIAGNOSIS — Z23 Encounter for immunization: Secondary | ICD-10-CM | POA: Diagnosis not present

## 2020-10-22 ENCOUNTER — Other Ambulatory Visit: Payer: Self-pay | Admitting: Obstetrics and Gynecology

## 2020-10-22 DIAGNOSIS — Z1231 Encounter for screening mammogram for malignant neoplasm of breast: Secondary | ICD-10-CM

## 2020-11-15 DIAGNOSIS — Z124 Encounter for screening for malignant neoplasm of cervix: Secondary | ICD-10-CM | POA: Diagnosis not present

## 2020-11-15 DIAGNOSIS — N958 Other specified menopausal and perimenopausal disorders: Secondary | ICD-10-CM | POA: Diagnosis not present

## 2020-11-15 DIAGNOSIS — Z6825 Body mass index (BMI) 25.0-25.9, adult: Secondary | ICD-10-CM | POA: Diagnosis not present

## 2020-11-19 ENCOUNTER — Other Ambulatory Visit: Payer: Self-pay | Admitting: Adult Health

## 2020-11-23 ENCOUNTER — Other Ambulatory Visit: Payer: Self-pay | Admitting: Cardiology

## 2020-11-30 ENCOUNTER — Other Ambulatory Visit: Payer: Self-pay

## 2020-11-30 ENCOUNTER — Ambulatory Visit
Admission: RE | Admit: 2020-11-30 | Discharge: 2020-11-30 | Disposition: A | Discharge status: Home / Self Care | Payer: Medicare Other | Source: Ambulatory Visit | Attending: Obstetrics and Gynecology | Admitting: Obstetrics and Gynecology

## 2020-11-30 DIAGNOSIS — Z1231 Encounter for screening mammogram for malignant neoplasm of breast: Secondary | ICD-10-CM

## 2020-12-05 ENCOUNTER — Other Ambulatory Visit: Payer: Self-pay | Admitting: Obstetrics and Gynecology

## 2020-12-05 DIAGNOSIS — Z853 Personal history of malignant neoplasm of breast: Secondary | ICD-10-CM

## 2020-12-11 DIAGNOSIS — Z23 Encounter for immunization: Secondary | ICD-10-CM | POA: Diagnosis not present

## 2020-12-17 ENCOUNTER — Other Ambulatory Visit: Payer: Self-pay | Admitting: Adult Health

## 2020-12-27 ENCOUNTER — Encounter: Payer: Self-pay | Admitting: Cardiology

## 2020-12-27 ENCOUNTER — Ambulatory Visit (INDEPENDENT_AMBULATORY_CARE_PROVIDER_SITE_OTHER): Payer: Medicare Other | Admitting: Cardiology

## 2020-12-27 ENCOUNTER — Other Ambulatory Visit: Payer: Self-pay

## 2020-12-27 VITALS — BP 138/82 | HR 82 | Ht 61.0 in | Wt 132.6 lb

## 2020-12-27 DIAGNOSIS — I10 Essential (primary) hypertension: Secondary | ICD-10-CM | POA: Diagnosis not present

## 2020-12-27 DIAGNOSIS — Z1329 Encounter for screening for other suspected endocrine disorder: Secondary | ICD-10-CM | POA: Diagnosis not present

## 2020-12-27 DIAGNOSIS — E782 Mixed hyperlipidemia: Secondary | ICD-10-CM

## 2020-12-27 DIAGNOSIS — I2510 Atherosclerotic heart disease of native coronary artery without angina pectoris: Secondary | ICD-10-CM

## 2020-12-27 DIAGNOSIS — E559 Vitamin D deficiency, unspecified: Secondary | ICD-10-CM | POA: Diagnosis not present

## 2020-12-27 NOTE — Progress Notes (Signed)
Cardiology Office Note:    Date:  12/27/2020   ID:  Andrea [name], [place] [birth year], MRN [id number]  PCP:  Ronita Hipps, MD  Cardiologist:  Jenean Lindau, MD   Referring MD: Ronita Hipps, MD    ASSESSMENT:    1. Coronary artery disease involving native coronary artery of native heart without angina pectoris   2. Essential hypertension   3. Mixed hyperlipidemia    PLAN:    In order of problems listed above:  Coronary artery disease: Secondary prevention stressed with patient.  Importance of compliance with diet medication stressed and she vocalized understanding.  She is doing well with exercise and congratulated her about this. Essential hypertension: Blood pressure stable and diet was emphasized.  Lifestyle modification was urged. Mixed dyslipidemia: Patient is doing well with diet and exercise.  She will have blood work as she is fasting this morning and we will advise accordingly. History of cardiomyopathy: Stable at this time with preserved ejection fraction. Patient will be seen in follow-up appointment in 6 months or earlier if the patient has any concerns    Medication Adjustments/Labs and Tests Ordered: Current medicines are reviewed at length with the patient today.  Concerns regarding medicines are outlined above.  No orders of the defined types were placed in this encounter.  No orders of the defined types were placed in this encounter.    No chief complaint on file.    History of Present Illness:    Andrea [name] is a 75 y.o. female.  Patient has past medical history of cardiomyopathy, coronary artery disease, essential hypertension and dyslipidemia.  She denies any problems at this time and takes care of activities of daily living.  No chest pain orthopnea or PND.  At the time of my evaluation, the patient is alert awake oriented and in no distress.  She exercises on a regular basis.  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   Hyperlipemia 12/22/2016   Hyperlipidemia    Hypertension    Palpitations    Personal history of radiation therapy     Past Surgical History:  Procedure Laterality Date   ABDOMINAL HYSTERECTOMY     BREAST LUMPECTOMY     BREAST LUMPECTOMY WITH RADIOACTIVE SEED LOCALIZATION Left 01/04/2019   Procedure: LEFT BREAST LUMPECTOMY WITH RADIOACTIVE SEED LOCALIZATION;  Surgeon: Erroll Luna, MD;  Location: [facility];  Service: General;  Laterality: Left;   INNER EAR SURGERY     RE-EXCISION OF BREAST LUMPECTOMY Left 01/18/2019   Procedure: RE-EXCISION OF LEFT BREAST LUMPECTOMY;  Surgeon: Erroll Luna, MD;  Location: [facility];  Service: General;  Laterality: Left;   ROTATOR CUFF REPAIR Right    WRIST SURGERY      Current Medications: Current Meds  Medication Sig   Ascorbic Acid (VITAMIN C) 1000 MG tablet Take 1,000 mg by mouth daily.    aspirin  81 MG chewable tablet Chew 81 mg by mouth daily.   beta carotene 10000 UNIT capsule Take 10,000 Units by mouth daily.    Calcium Carb-Cholecalciferol (CALCIUM-VITAMIN D) 500-200 MG-UNIT tablet Take 1 tablet by mouth daily.    carvedilol (COREG) 6.25 MG tablet TAKE 1/2 TABLET BY MOUTH TWICE DAILY   CRANBERRY PO Take 1 tablet by mouth daily.   gabapentin (NEURONTIN) 100 MG capsule TAKE 2 CAPSULES(200 MG) BY MOUTH AT BEDTIME   Magnesium 300 MG CAPS Take 300 mg by mouth daily.   Multiple Vitamins-Minerals (OCUVITE PO)  Take 1 tablet by mouth daily.    Omega-3 1000 MG CAPS Take 2,000 mg by mouth 2 (two) times daily.   omeprazole (PRILOSEC) 20 MG capsule Take 40 mg by mouth daily.   Probiotic CAPS Take 1 capsule by mouth daily.    tamoxifen (NOLVADEX) 10 MG tablet Take 5 mg by mouth daily.   vitamin B-12 (CYANOCOBALAMIN) 1000 MCG tablet Take 1,000 mcg by mouth daily.    zinc gluconate 50 MG tablet Take 50 mg by mouth daily.      Allergies:   Cortisone   Social History   Socioeconomic History   Marital status: Divorced    Spouse name: Not on file   Number of children: Not on file   Years of education: Not on file   Highest education level: Not on file  Occupational History   Not on file  Tobacco Use   Smoking status: Never   Smokeless tobacco: Never  Vaping Use   Vaping Use: Never used  Substance and Sexual Activity   Alcohol use: Yes    Comment: rare   Drug use: No   Sexual activity: Not on file  Other Topics Concern   Not on file  Social History Narrative   Not on file   Social Determinants of Health   Financial Resource Strain: Not on file  Food Insecurity: Not on file  Transportation Needs: Not on file  Physical Activity: Not on file  Stress: Not on file  Social Connections: Not on file     Family History: The patient's family history includes Valvular heart disease in her brother.  ROS:   Please see the history of present illness.    All other systems reviewed and are negative.  EKGs/Labs/Other Studies Reviewed:    The following studies were reviewed today: I discussed my findings with the patient.  EKG presents normal nonspecific ST-T changes.   Recent Labs: 06/06/2020: ALT 12; BUN 13; Creatinine, Ser 0.86; Hemoglobin 12.6; Platelets 198; Potassium 4.2; Sodium 143; TSH 1.290  Recent Lipid Panel    Component Value Date/Time   CHOL 224 (H) 06/06/2020 0839   TRIG 102 06/06/2020 0839   HDL 86 06/06/2020 0839   CHOLHDL 2.6 06/06/2020 0839   LDLCALC 120 (H) 06/06/2020  0839    Physical Exam:    VS:  BP 138/82   Pulse 82   Ht 5\' 1"  (1.549 m)   Wt 132 lb 9.6 oz (60.1 kg)   SpO2 97%   BMI 25.05 kg/m     Wt Readings from Last 3 Encounters:  12/27/20 132 lb 9.6 oz (60.1 kg)  06/06/20 138 lb 6.4 oz (62.8 kg)  12/07/19 136 lb 12.8 oz (62.1 kg)     GEN: Patient is in no acute distress HEENT: Normal NECK: No JVD; No carotid bruits LYMPHATICS: No lymphadenopathy CARDIAC: Hear sounds regular, 2/6 systolic murmur at the apex. RESPIRATORY:  Clear to auscultation without  rales, wheezing or rhonchi  ABDOMEN: Soft, non-tender, non-distended MUSCULOSKELETAL:  No edema; No deformity  SKIN: Warm and dry NEUROLOGIC:  Alert and oriented x 3 PSYCHIATRIC:  Normal affect   Signed, Jenean [facility], MD  12/27/2020 8:49 AM    [facility]

## 2020-12-27 NOTE — Patient Instructions (Signed)

## 2020-12-28 LAB — CBC WITH DIFFERENTIAL/PLATELET
Basophils Absolute: 0 10*3/uL (ref 0.0–0.2)
Basos: 1 %
EOS (ABSOLUTE): 0.1 10*3/uL (ref 0.0–0.4)
Eos: 3 %
Hematocrit: 39.8 % (ref 34.0–46.6)
Hemoglobin: 13.3 g/dL (ref 11.1–15.9)
Immature Grans (Abs): 0 10*3/uL (ref 0.0–0.1)
Immature Granulocytes: 0 %
Lymphocytes Absolute: 1.1 10*3/uL (ref 0.7–3.1)
Lymphs: 34 %
MCH: 31.5 pg (ref 26.6–33.0)
MCHC: 33.4 g/dL (ref 31.5–35.7)
MCV: 94 fL (ref 79–97)
Monocytes Absolute: 0.4 10*3/uL (ref 0.1–0.9)
Monocytes: 10 %
Neutrophils Absolute: 1.7 10*3/uL (ref 1.4–7.0)
Neutrophils: 52 %
Platelets: 231 10*3/uL (ref 150–450)
RBC: 4.22 x10E6/uL (ref 3.77–5.28)
RDW: 12.1 % (ref 11.7–15.4)
WBC: 3.4 10*3/uL (ref 3.4–10.8)

## 2020-12-28 LAB — BASIC METABOLIC PANEL
BUN/Creatinine Ratio: 18 (ref 12–28)
BUN: 15 mg/dL (ref 8–27)
CO2: 26 mmol/L (ref 20–29)
Calcium: 10.3 mg/dL (ref 8.7–10.3)
Chloride: 103 mmol/L (ref 96–106)
Creatinine, Ser: 0.83 mg/dL (ref 0.57–1.00)
Glucose: 107 mg/dL — ABNORMAL HIGH (ref 70–99)
Potassium: 4.5 mmol/L (ref 3.5–5.2)
Sodium: 142 mmol/L (ref 134–144)
eGFR: 73 mL/min/{1.73_m2} (ref >59)

## 2020-12-28 LAB — LIPID PANEL
Chol/HDL Ratio: 2.5 ratio (ref 0.0–4.4)
Cholesterol, Total: 212 mg/dL — ABNORMAL HIGH (ref 100–199)
HDL: 85 mg/dL (ref >39)
LDL Chol Calc (NIH): 110 mg/dL — ABNORMAL HIGH (ref 0–99)
Triglycerides: 98 mg/dL (ref 0–149)
VLDL Cholesterol Cal: 17 mg/dL (ref 5–40)

## 2020-12-28 LAB — TSH: TSH: 1.31 u[IU]/mL (ref 0.450–4.500)

## 2020-12-28 LAB — VITAMIN D 25 HYDROXY (VIT D DEFICIENCY, FRACTURES): Vit D, 25-Hydroxy: 36.2 ng/mL (ref 30.0–100.0)

## 2020-12-28 LAB — HEPATIC FUNCTION PANEL
ALT: 10 IU/L (ref 0–32)
AST: 17 IU/L (ref 0–40)
Albumin: 4.4 g/dL (ref 3.7–4.7)
Alkaline Phosphatase: 51 IU/L (ref 44–121)
Bilirubin Total: 0.3 mg/dL (ref 0.0–1.2)
Bilirubin, Direct: 0.1 mg/dL (ref 0.00–0.40)
Total Protein: 6.8 g/dL (ref 6.0–8.5)

## 2021-01-11 ENCOUNTER — Ambulatory Visit
Admission: RE | Admit: 2021-01-11 | Discharge: 2021-01-11 | Disposition: A | Discharge status: Home / Self Care | Payer: Medicare Other | Source: Ambulatory Visit | Attending: Obstetrics and Gynecology | Admitting: Obstetrics and Gynecology

## 2021-01-11 ENCOUNTER — Other Ambulatory Visit: Payer: Self-pay

## 2021-01-11 DIAGNOSIS — Z853 Personal history of malignant neoplasm of breast: Secondary | ICD-10-CM

## 2021-01-11 DIAGNOSIS — Z124 Encounter for screening for malignant neoplasm of cervix: Secondary | ICD-10-CM | POA: Diagnosis not present

## 2021-01-11 DIAGNOSIS — R922 Inconclusive mammogram: Secondary | ICD-10-CM | POA: Diagnosis not present

## 2021-02-03 ENCOUNTER — Other Ambulatory Visit: Payer: Self-pay | Admitting: Hematology and Oncology

## 2021-05-16 ENCOUNTER — Other Ambulatory Visit: Payer: Self-pay | Admitting: Cardiology

## 2021-05-28 IMAGING — MG MM BREAST BX W LOC DEV 1ST LESION IMAGE BX SPEC STEREO GUIDE*L*
7 of 10 series · 7 of 14 positions shown · non-contrast
Comparison: Previous exams.
COMPARISON: Previous exams.

Addendum:
CLINICAL DATA: 73-year-old with 2 groups of screening detected
calcifications in the UPPER OUTER QUADRANT of the LEFT breast. There
is a 2.1 cm group of suspicious calcifications at ANTERIOR depth and
there is a 0.4 cm group of indeterminate calcifications at POSTERIOR
depth (near the 9 o'clock location).

EXAM:
LEFT BREAST STEREOTACTIC CORE NEEDLE BIOPSY

[L (1 of 4)]
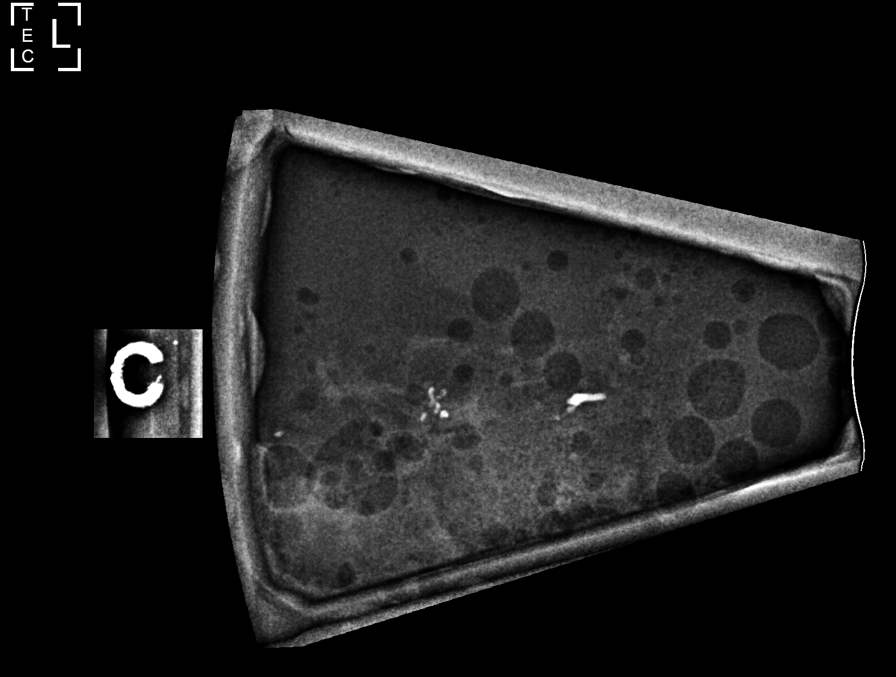

[L (2 of 4)]
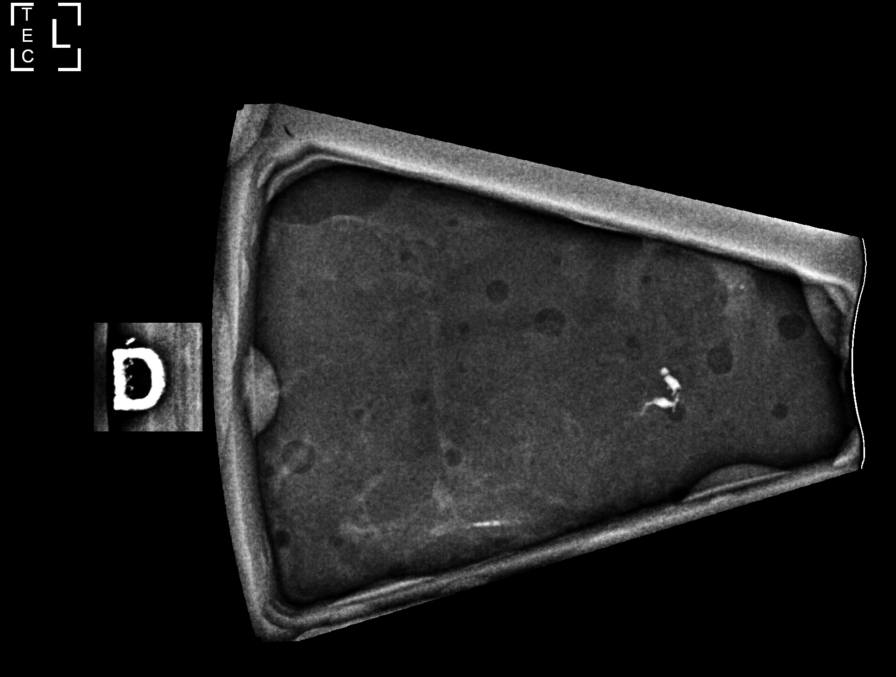

[L (3 of 4)]
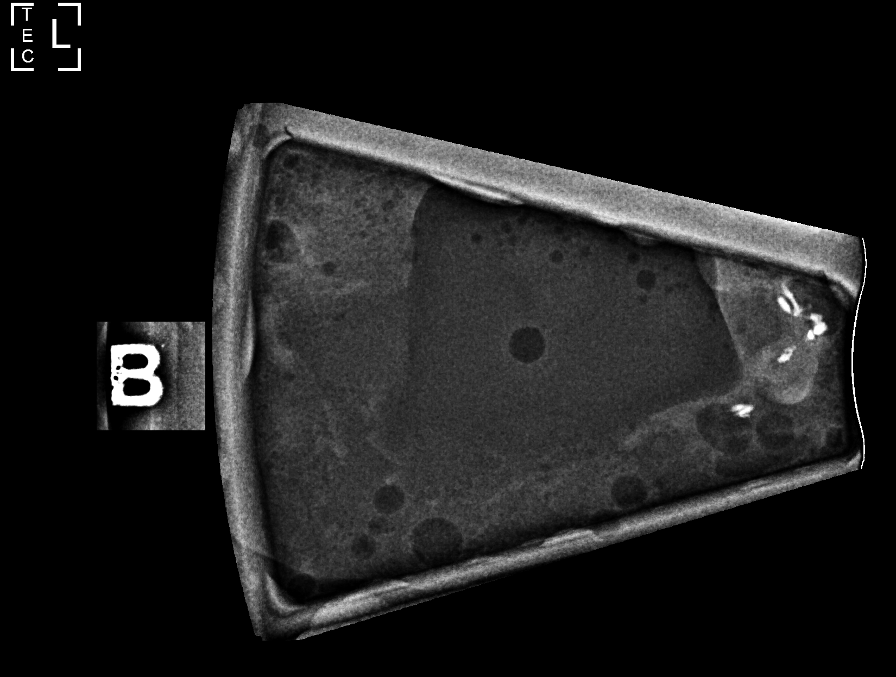

[L (4 of 4)]
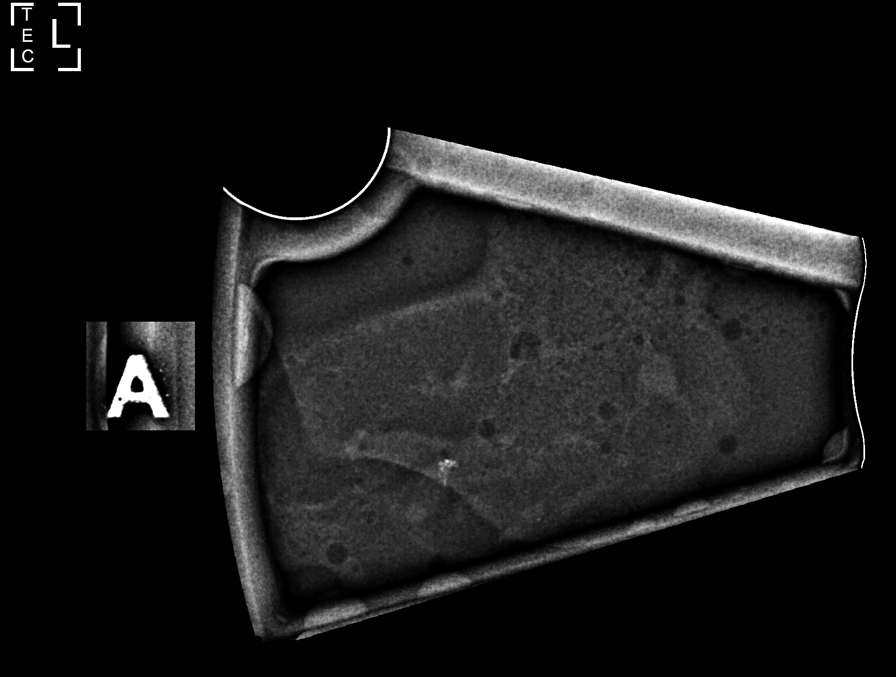

[L CC (1 of 3)]
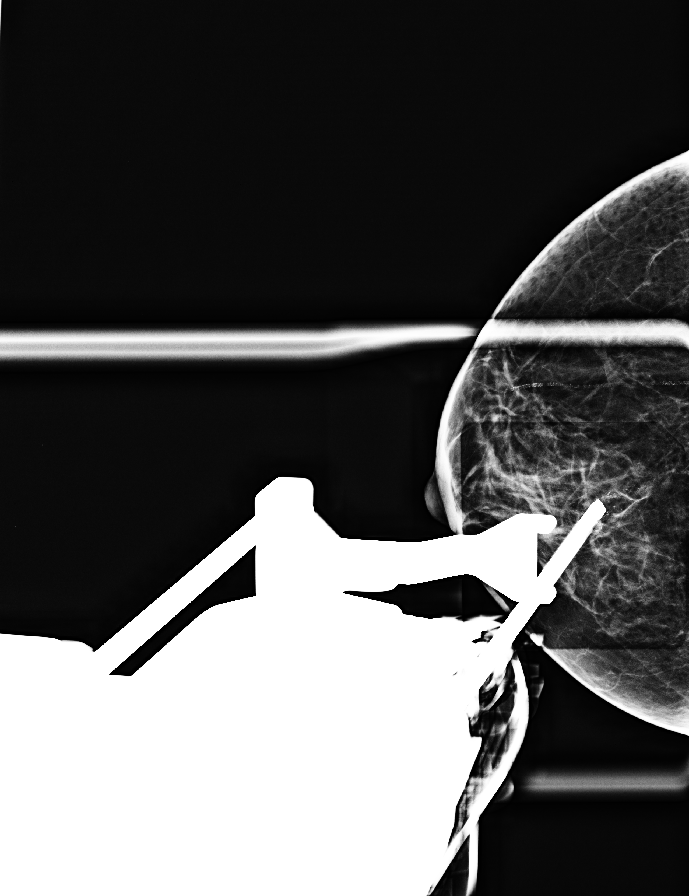

[L CC (2 of 3)]
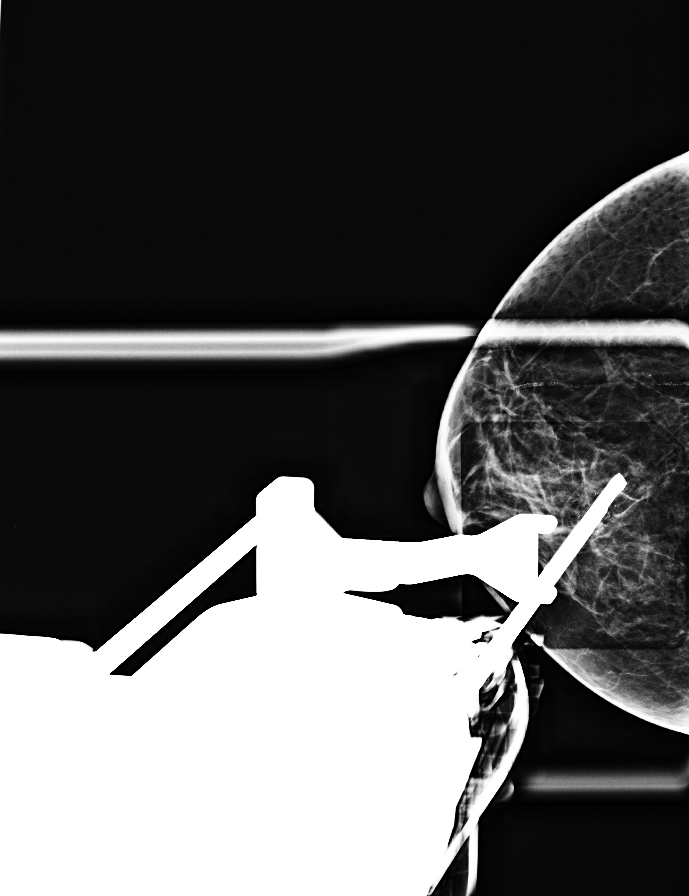

[L CC (3 of 3)]
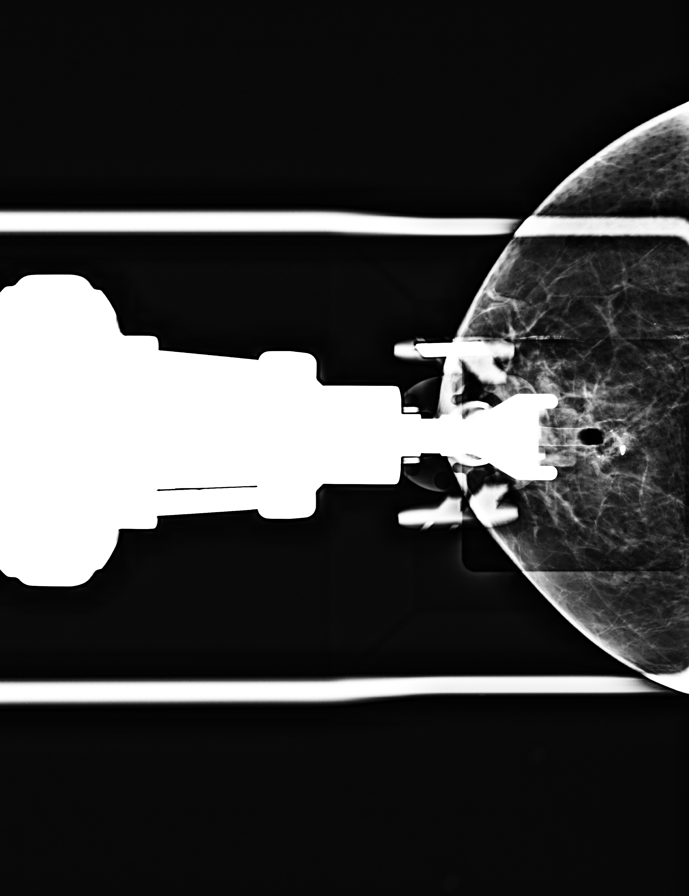

[7 of 14 positions shown; findings below may reference images not displayed]



# 1) ANTERIOR group: Lesion quadrant: UPPER OUTER QUADRANT.

Initially, using sterile technique with chlorhexidine as skin
antisepsis, 1% lidocaine and 1% lidocaine with epinephrine as local
anesthetic, under stereotactic guidance, a 9 gauge Brevera vacuum
assisted device was used to perform core needle biopsy of
calcifications involving the UPPER OUTER QUADRANT of the LEFT breast
at ANTERIOR depth using a SUPERIOR approach. Specimen radiograph was
performed showing calcifications in all 4 of the core samples.
Specimens with calcifications are identified for pathology. At the
conclusion of the procedure, a coil shaped tissue marker clip was
deployed into the biopsy cavity.

# 2: POSTERIOR group: Lesion quadrant: Slight UPPER OUTER QUADRANT
(near 9 o'clock).

Using sterile technique with chlorhexidine as skin antisepsis, 1%
lidocaine and 1% lidocaine with epinephrine as local anesthetic,
under stereotactic guidance, a 9 gauge Brevera vacuum assisted
device was used to perform core needle biopsy of calcifications
involving the UPPER OUTER QUADRANT of the LEFT breast at POSTERIOR
depth, near 9 o'clock location, using a SUPERIOR approach. Specimen
radiograph was performed showing calcifications in 2 of the core
samples. Specimens with calcifications are identified for pathology.
The conclusion of the procedure, an X shaped tissue marker clip was
deployed into the biopsy cavity.

Follow-up 2-view mammogram was performed and dictated separately.
IMPRESSION: Stereotactic-guided biopsy of 2 groups of calcifications involving
the UPPER OUTER QUADRANT of the LEFT breast. No apparent
complications.

ADDENDUM:
Pathology revealed HIGH GRADE DUCTAL CARCINOMA IN SITU WITH
CALCIFICATIONS of the LEFT breast, upper outer quadrant anterior
depth. This was found to be concordant by Dr. Lorenz Jumper.

Pathology revealed FIBROADENOMA WITH CALCIFICATIONS of the LEFT
breast, upper outer quadrant, posterior depth. This was found to be
concordant by Dr. Lorenz Jumper.

Pathology results were discussed with the patient by telephone. The
patient reported doing well after the biopsies with tenderness at
the sites. Post biopsy instructions and care were reviewed and
questions were answered. The patient was encouraged to call The

Surgical consultation has been arranged with Dr. Melani Moya at
[[email or web address]] on December 06, 2018 per patient request.

Bilateral Breast MRI is recommended to determine extent of disease
per Dr. Rifhal.

Pathology results reported by Berline Marotta, RN on 12/01/2018.



# 1) ANTERIOR group: Lesion quadrant: UPPER OUTER QUADRANT.

Initially, using sterile technique with chlorhexidine as skin
antisepsis, 1% lidocaine and 1% lidocaine with epinephrine as local
anesthetic, under stereotactic guidance, a 9 gauge Brevera vacuum
assisted device was used to perform core needle biopsy of
calcifications involving the UPPER OUTER QUADRANT of the LEFT breast
at ANTERIOR depth using a SUPERIOR approach. Specimen radiograph was
performed showing calcifications in all 4 of the core samples.
Specimens with calcifications are identified for pathology. At the
conclusion of the procedure, a coil shaped tissue marker clip was
deployed into the biopsy cavity.

# 2: POSTERIOR group: Lesion quadrant: Slight UPPER OUTER QUADRANT
(near 9 o'clock).

Using sterile technique with chlorhexidine as skin antisepsis, 1%
lidocaine and 1% lidocaine with epinephrine as local anesthetic,
under stereotactic guidance, a 9 gauge Brevera vacuum assisted
device was used to perform core needle biopsy of calcifications
involving the UPPER OUTER QUADRANT of the LEFT breast at POSTERIOR
depth, near 9 o'clock location, using a SUPERIOR approach. Specimen
radiograph was performed showing calcifications in 2 of the core
samples. Specimens with calcifications are identified for pathology.
The conclusion of the procedure, an X shaped tissue marker clip was
deployed into the biopsy cavity.

Follow-up 2-view mammogram was performed and dictated separately.
IMPRESSION: Stereotactic-guided biopsy of 2 groups of calcifications involving
the UPPER OUTER QUADRANT of the LEFT breast. No apparent
complications.

## 2021-05-29 ENCOUNTER — Other Ambulatory Visit: Payer: Self-pay | Admitting: Hematology and Oncology

## 2021-07-12 ENCOUNTER — Other Ambulatory Visit: Payer: Self-pay

## 2021-07-12 ENCOUNTER — Inpatient Hospital Stay: Payer: Medicare Other | Attending: Hematology and Oncology | Admitting: Hematology and Oncology

## 2021-07-12 DIAGNOSIS — D0512 Intraductal carcinoma in situ of left breast: Secondary | ICD-10-CM

## 2021-07-12 DIAGNOSIS — R232 Flushing: Secondary | ICD-10-CM | POA: Insufficient documentation

## 2021-07-12 DIAGNOSIS — M791 Myalgia, unspecified site: Secondary | ICD-10-CM | POA: Diagnosis not present

## 2021-07-12 DIAGNOSIS — Z853 Personal history of malignant neoplasm of breast: Secondary | ICD-10-CM | POA: Diagnosis not present

## 2021-07-12 DIAGNOSIS — M79606 Pain in leg, unspecified: Secondary | ICD-10-CM | POA: Insufficient documentation

## 2021-07-12 NOTE — Progress Notes (Signed)
? ?Patient Care Team: ?Andrea Hipps, MD as PCP - General (Family Medicine) ?Andrea Pray, MD as Consulting Physician (Radiation Oncology) ?Andrea Lose, MD as Consulting Physician (Hematology and Oncology) ?Andrea Luna, MD as Consulting Physician (General Surgery) ? ?DIAGNOSIS:  ?Encounter Diagnosis  ?Name Primary?  ? Ductal carcinoma in situ (DCIS) of left breast   ? ? ?SUMMARY OF ONCOLOGIC HISTORY: ?Oncology History  ?Ductal carcinoma in situ (DCIS) of left breast  ?12/09/2018 Initial Diagnosis  ? Routine screening mammogram detected left breast calcifications spanning 1.8cm in the UOQ, and an additional group of calcifications in the UOQ spanning 0.4cm. Biopsy DCIS with calcifications, high grade, ER+ 100%, PR+ 10%.  ?  ?12/09/2018 Cancer Staging  ? Staging form: Breast, AJCC 8th Edition ?- Clinical stage from 12/09/2018: Stage 0 (cTis (DCIS), cN0, cM0, ER+, PR+) ? ?  ?01/04/2019 Surgery  ? Left lumpectomy (Andrea [name]) [phone]): high grade DCIS, 2.0cm, involved margins. No regional lymph nodes examined. ?  ?01/04/2019 Cancer Staging  ? Staging form: Breast, AJCC 8th Edition ?- Pathologic stage from 01/04/2019: Stage 0 (pTis (DCIS), pN0, cM0)  ? ?  ?01/18/2019 Surgery  ? Left lumpectomy re-excision (Andrea [name]) ([id number]): medial and inferior margin negative for in situ or invasive carcinoma ?  ?02/18/2019 - 03/18/2019 Radiation Therapy  ? The patient initially received a dose of 40.05 Gy in 15 fractions to the breast using whole-breast tangent fields. This was delivered using a 3-D conformal technique. The pt received a boost delivering an additional 10 Gy in 5 fractions using a electron boost with 72mV electrons. The total dose was 50.05 Gy. ?  ?03/2019 - 03/2024 Anti-estrogen oral therapy  ? Tamoxifen ?  ? ? ?CHIEF COMPLIANT:  left breast DCIS on Tamoxifen. ? ?INTERVAL HISTORY: PSabrea Sankeyis a 76y.o. with above-mentioned history of left breast DCIS. She states she has some leg pain. She states  she still have some hot flashes. She complains of some stiffness.  She is complaining of severe pain in the muscles of the calf making it difficult for her to be active.  She still stays active but and she feels pain and discomfort.  She has been taking her tamoxifen religiously without any gaps. ? ? ?ALLERGIES:  is allergic to cortisone. ? ?MEDICATIONS:  ?Current Outpatient Medications  ?Medication Sig Dispense Refill  ? Ascorbic Acid (VITAMIN C) 1000 MG tablet Take 1,000 mg by mouth daily.     ? aspirin 81 MG chewable tablet Chew 81 mg by mouth daily.    ? beta carotene 10000 UNIT capsule Take 10,000 Units by mouth daily.     ? beta carotene 25000 UNIT capsule beta carotene 7,500 mcg (25,000 unit) capsule ? Take 3 capsules every day by oral route.    ? Calcium Carb-Cholecalciferol (CALCIUM-VITAMIN D) 500-200 MG-UNIT tablet Take 1 tablet by mouth daily.     ? carvedilol (COREG) 6.25 MG tablet TAKE 1/2 TABLET BY MOUTH TWICE DAILY 90 tablet 2  ? CRANBERRY PO Take 1 tablet by mouth daily.    ? Magnesium 300 MG CAPS Take 300 mg by mouth daily.    ? Multiple Vitamins-Minerals (OCUVITE PO) Take 1 tablet by mouth daily.     ? Omega-3 1000 MG CAPS Take 2,000 mg by mouth 2 (two) times daily.    ? omeprazole (PRILOSEC) 20 MG capsule Take 40 mg by mouth daily.    ? Probiotic CAPS Take 1 capsule by mouth daily.     ? tamoxifen (NOLVADEX) 10 MG tablet  TAKE ONE-HALF TABLET BY MOUTH DAILY 60 tablet 0  ? vitamin B-12 (CYANOCOBALAMIN) 1000 MCG tablet Take 1,000 mcg by mouth daily.     ? zinc gluconate 50 MG tablet Take 50 mg by mouth daily.     ? ?No current facility-administered medications for this visit.  ? ? ?PHYSICAL EXAMINATION: ?ECOG PERFORMANCE STATUS: 1 - Symptomatic but completely ambulatory ? ?Vitals:  ? 07/12/21 1035  ?BP: (!) 168/87  ?Pulse: 89  ?Resp: 16  ?Temp: 99 ?F (37.2 ?C)  ?SpO2: 99%  ? ?Filed Weights  ? 07/12/21 1035  ?Weight: 134 lb 6.4 oz (61 kg)  ? ? ?BREAST: No palpable masses or nodules in either right or  left breasts. No palpable axillary supraclavicular or infraclavicular adenopathy no breast tenderness or nipple discharge. (exam performed in the presence of a chaperone) ? ?LABORATORY DATA:  ?I have reviewed the data as listed ? ?  Latest Ref Rng & Units 12/27/2020  ?  9:01 AM 06/06/2020  ?  8:39 AM 12/07/2019  ?  9:14 AM  ?CMP  ?Glucose 70 - 99 mg/dL 107   103   108    ?BUN 8 - 27 mg/dL '15   13   17    '$ ?Creatinine 0.57 - 1.00 mg/dL 0.83   0.86   0.83    ?Sodium 134 - 144 mmol/L 142   143   143    ?Potassium 3.5 - 5.2 mmol/L 4.5   4.2   4.4    ?Chloride 96 - 106 mmol/L 103   103   106    ?CO2 20 - 29 mmol/L '26   24   24    '$ ?Calcium 8.7 - 10.3 mg/dL 10.3   9.4   9.4    ?Total Protein 6.0 - 8.5 g/dL 6.8   6.8   6.9    ?Total Bilirubin 0.0 - 1.2 mg/dL 0.3   0.3   0.4    ?Alkaline Phos 44 - 121 IU/L 51   51   52    ?AST 0 - 40 IU/L '17   16   16    '$ ?ALT 0 - 32 IU/L '10   12   10    '$ ? ? ?Lab Results  ?Component Value Date  ? WBC 3.4 12/27/2020  ? HGB 13.3 12/27/2020  ? HCT 39.8 12/27/2020  ? MCV 94 12/27/2020  ? PLT 231 12/27/2020  ? NEUTROABS 1.7 12/27/2020  ? ? ?ASSESSMENT & PLAN:  ?Ductal carcinoma in situ (DCIS) of left breast ?Left lumpectomy: DCIS high-grade 2 cm, focally present at the inferior/medial junction margin ER 100%, PR 10%, Tis NX stage 0 ?Adjuvant radiation: 02/18/2019-03/14/2019 ?  ?Treatment plan: Adjuvant tamoxifen 20 mg daily x5 years (we will start her at 5 mg daily ) ?Tamoxifen toxicities: ?1.  Muscle pain and tenderness: I discussed with her about stopping tamoxifen for a month and see if the muscle pains get better.  If they do get better then she can stop it permanently.  If they do not get better then she will resume tamoxifen.  She has been on it for 2 and half years. ? ?Severe hot flashes: Discontinued gabapentin because it was causing her drowsiness and sleepiness. ? ?Breast cancer surveillance: ?1.  Breast exam 07/12/2021: Benign ?2. mammogram 01/11/2021: Benign breast density category  B ? ?Return to clinic in 1 year for follow-up ? ? ?No orders of the defined types were placed in this encounter. ? ?The patient has a good  understanding of the overall plan. she agrees with it. she will call with any problems that may develop before the next visit here. ?Total time spent: 30 mins including face to face time and time spent for planning, charting and co-ordination of care ? ? Harriette Ohara, MD ?07/12/21 ? ? ? I Gardiner Coins am scribing for Dr. Lindi Adie ? ?I have reviewed the above documentation for accuracy and completeness, and I agree with the above. ?  ?

## 2021-07-12 NOTE — Assessment & Plan Note (Addendum)
Left lumpectomy: DCIS high-grade 2 cm, focally present at the inferior/medial junction margin ER 100%, PR 10%, Tis NX stage 0 ?Adjuvant radiation: 02/18/2019-03/14/2019 ?? ?Treatment plan: Adjuvant tamoxifen 20 mg daily x5 years (we will start her at 5 mg daily ) ?Tamoxifen toxicities: ?1.  Muscle pain and tenderness: I discussed with her about stopping tamoxifen for a month and see if the muscle pains get better.  If they do get better then she can stop it permanently.  If they do not get better then she will resume tamoxifen.  She has been on it for 2 and half years. ? ?Severe hot flashes: Discontinued gabapentin because it was causing her drowsiness and sleepiness. ? ?Breast cancer surveillance: ?1.  Breast exam 07/12/2021: Benign ?2. mammogram 01/11/2021: Benign breast density category B ? ?Return to clinic in 1 year for follow-up ?

## 2021-09-23 DIAGNOSIS — D0512 Intraductal carcinoma in situ of left breast: Secondary | ICD-10-CM | POA: Diagnosis not present

## 2021-09-25 DIAGNOSIS — H35369 Drusen (degenerative) of macula, unspecified eye: Secondary | ICD-10-CM | POA: Diagnosis not present

## 2021-09-25 DIAGNOSIS — H259 Unspecified age-related cataract: Secondary | ICD-10-CM | POA: Diagnosis not present

## 2021-09-25 DIAGNOSIS — D3131 Benign neoplasm of right choroid: Secondary | ICD-10-CM | POA: Diagnosis not present

## 2021-09-25 DIAGNOSIS — H40013 Open angle with borderline findings, low risk, bilateral: Secondary | ICD-10-CM | POA: Diagnosis not present

## 2021-09-25 DIAGNOSIS — H5213 Myopia, bilateral: Secondary | ICD-10-CM | POA: Diagnosis not present

## 2021-11-06 ENCOUNTER — Other Ambulatory Visit: Payer: Self-pay | Admitting: Cardiology

## 2021-11-06 NOTE — Telephone Encounter (Signed)
Refill sent to pharmacy.   

## 2021-12-09 LAB — HM COLONOSCOPY

## 2021-12-17 DIAGNOSIS — Z23 Encounter for immunization: Secondary | ICD-10-CM | POA: Diagnosis not present

## 2022-01-20 ENCOUNTER — Other Ambulatory Visit: Payer: Self-pay | Admitting: Cardiology

## 2022-01-20 DIAGNOSIS — I2510 Atherosclerotic heart disease of native coronary artery without angina pectoris: Secondary | ICD-10-CM

## 2022-01-20 DIAGNOSIS — D0510 Intraductal carcinoma in situ of unspecified breast: Secondary | ICD-10-CM

## 2022-01-20 DIAGNOSIS — E782 Mixed hyperlipidemia: Secondary | ICD-10-CM

## 2022-01-20 DIAGNOSIS — H9190 Unspecified hearing loss, unspecified ear: Secondary | ICD-10-CM | POA: Insufficient documentation

## 2022-01-20 HISTORY — DX: Unspecified hearing loss, unspecified ear: H91.90

## 2022-01-20 HISTORY — DX: Mixed hyperlipidemia: E78.2

## 2022-01-20 HISTORY — DX: Intraductal carcinoma in situ of unspecified breast: D05.10

## 2022-01-20 HISTORY — DX: Atherosclerotic heart disease of native coronary artery without angina pectoris: I25.10

## 2022-01-20 NOTE — Telephone Encounter (Signed)
Refill to pharmacy 

## 2022-01-21 ENCOUNTER — Encounter: Payer: Self-pay | Admitting: Cardiology

## 2022-01-21 ENCOUNTER — Ambulatory Visit: Payer: Medicare Other | Attending: Cardiology | Admitting: Cardiology

## 2022-01-21 VITALS — BP 138/88 | HR 97 | Ht 61.0 in | Wt 132.8 lb

## 2022-01-21 DIAGNOSIS — I10 Essential (primary) hypertension: Secondary | ICD-10-CM

## 2022-01-21 DIAGNOSIS — I2510 Atherosclerotic heart disease of native coronary artery without angina pectoris: Secondary | ICD-10-CM | POA: Diagnosis not present

## 2022-01-21 DIAGNOSIS — Z8679 Personal history of other diseases of the circulatory system: Secondary | ICD-10-CM | POA: Diagnosis not present

## 2022-01-21 DIAGNOSIS — Z131 Encounter for screening for diabetes mellitus: Secondary | ICD-10-CM | POA: Diagnosis not present

## 2022-01-21 DIAGNOSIS — Z1321 Encounter for screening for nutritional disorder: Secondary | ICD-10-CM | POA: Diagnosis not present

## 2022-01-21 DIAGNOSIS — I428 Other cardiomyopathies: Secondary | ICD-10-CM | POA: Diagnosis not present

## 2022-01-21 DIAGNOSIS — E782 Mixed hyperlipidemia: Secondary | ICD-10-CM

## 2022-01-21 NOTE — Patient Instructions (Addendum)
Medication Instructions:  Your physician recommends that you continue on your current medications as directed. Please refer to the Current Medication list given to you today.  *If you need a refill on your cardiac medications before your next appointment, please call your pharmacy*   Lab Work: Your physician recommends that you have labs done in the office today. Your test included  basic metabolic panel, complete blood count, TSH, liver function and lipids.  If you have labs (blood work) drawn today and your tests are completely normal, you will receive your results only by: [place] (if you have MyChart) OR A paper copy in the mail If you have any lab test that is abnormal or we need to change your treatment, we will call you to review the results.   Testing/Procedures: Your physician has requested that you have an echocardiogram. Echocardiography is a painless test that uses sound waves to create images of your heart. It provides your doctor with information about the size and shape of your heart and how well your heart's chambers and valves are working. This procedure takes approximately one hour. There are no restrictions for this procedure.    Follow-Up: At [facility], you and your health needs are our priority.  As part of our continuing mission to provide you with exceptional heart care, we have created designated Provider Care Teams.  These Care Teams include your primary Cardiologist (physician) and Advanced Practice Providers (APPs -  Physician Assistants and Nurse Practitioners) who all work together to provide you with the care you need, when you need it.  We recommend signing up for the patient portal called "MyChart".  Sign up information is provided on this After Visit Summary.  MyChart is used to connect with patients for Virtual Visits (Telemedicine).  Patients are able to view lab/test results, encounter notes, upcoming appointments, etc.  Non-urgent messages can  be sent to your provider as well.   To learn more about what you can do with MyChart, go to [email or web address].    Your next appointment:   9 month(s)  The format for your next appointment:   In Person  Provider:   Jyl Heinz, MD   Other Instructions Echocardiogram An echocardiogram is a test that uses sound waves (ultrasound) to produce images of the heart. Images from an echocardiogram can provide important information about: Heart size and shape. The size and thickness and movement of your heart's walls. Heart muscle function and strength. Heart valve function or if you have stenosis. Stenosis is when the heart valves are too narrow. If blood is flowing backward through the heart valves (regurgitation). A tumor or infectious growth around the heart valves. Areas of heart muscle that are not working well because of poor blood flow or injury from a heart attack. Aneurysm detection. An aneurysm is a weak or damaged part of an artery wall. The wall bulges out from the normal force of blood pumping through the body. Tell a health care provider about: Any allergies you have. All medicines you are taking, including vitamins, herbs, eye drops, creams, and over-the-counter medicines. Any blood disorders you have. Any surgeries you have had. Any medical conditions you have. Whether you are pregnant or may be pregnant. What are the risks? Generally, this is a safe test. However, problems may occur, including an allergic reaction to dye (contrast) that may be used during the test. What happens before the test? No specific preparation is needed. You may eat and drink normally. What happens during  the test? You will take off your clothes from the waist up and put on a hospital gown. Electrodes or electrocardiogram (ECG)patches may be placed on your chest. The electrodes or patches are then connected to a device that monitors your heart rate and rhythm. You will lie down on a  table for an ultrasound exam. A gel will be applied to your chest to help sound waves pass through your skin. A handheld device, called a transducer, will be pressed against your chest and moved over your heart. The transducer produces sound waves that travel to your heart and bounce back (or "echo" back) to the transducer. These sound waves will be captured in real-time and changed into images of your heart that can be viewed on a video monitor. The images will be recorded on a computer and reviewed by your health care provider. You may be asked to change positions or hold your breath for a short time. This makes it easier to get different views or better views of your heart. In some cases, you may receive contrast through an IV in one of your veins. This can improve the quality of the pictures from your heart. The procedure may vary among health care providers and hospitals.   What can I expect after the test? You may return to your normal, everyday life, including diet, activities, and medicines, unless your health care provider tells you not to do that. Follow these instructions at home: It is up to you to get the results of your test. Ask your health care provider, or the department that is doing the test, when your results will be ready. Keep all follow-up visits. This is important. Summary An echocardiogram is a test that uses sound waves (ultrasound) to produce images of the heart. Images from an echocardiogram can provide important information about the size and shape of your heart, heart muscle function, heart valve function, and other possible heart problems. You do not need to do anything to prepare before this test. You may eat and drink normally. After the echocardiogram is completed, you may return to your normal, everyday life, unless your health care provider tells you not to do that. This information is not intended to replace advice given to you by your health care provider. Make  sure you discuss any questions you have with your health care provider. Document Revised: 10/11/2019 Document Reviewed: 10/11/2019 Elsevier Patient Education  2021 [employer].

## 2022-01-21 NOTE — Progress Notes (Signed)
Cardiology Office Note:    Date:  01/21/2022   ID:  Andrea [name], [place] 1945-04-08, MRN [id number]  PCP:  Ronita Hipps, MD  Cardiologist:  Jenean Lindau, MD   Referring MD: Ronita Hipps, MD    ASSESSMENT:    1. Essential hypertension   2. Other cardiomyopathy ([place])   3. History of cardiomyopathy   4. Mixed hyperlipidemia    PLAN:    In order of problems listed above:  Coronary artery disease: Secondary prevention stressed with the patient.  Importance of compliance with diet medication stressed and she vocalized understanding.  She was advised to walk at least half an hour a day 5 days a week and she promises to do so. Essential hypertension: Patient is stable and diet was emphasized.  Lifestyle modification urged.  Her blood pressures at home are much better.  Salt intake issues were discussed. History of cardiomyopathy: We will do echocardiogram to assess ejection fraction and improve diet uptitrate her medications per guideline directed therapy. Mixed dyslipidemia: On lipid-lowering medications.  She will have fasting blood work today.  Diet emphasized. Patient will be seen in follow-up appointment in 6 months or earlier if the patient has any concerns    Medication Adjustments/Labs and Tests Ordered: Current medicines are reviewed at length with the patient today.  Concerns regarding medicines are outlined above.  No orders of the defined types were placed in this encounter.  No orders of the defined types were placed in this encounter.    No chief complaint on file.    History of Present Illness:    Andrea [name] is a 76 y.o. female.  Patient has past medical history of coronary atherosclerosis, cardiomyopathy, essential hypertension and mixed dyslipidemia.  She denies any problems at this time and takes care of activities of daily living.  No chest pain orthopnea or PND.  At the time of my evaluation, the patient is alert awake oriented and in no  distress.  Past Medical History:  Diagnosis Date   Breast cancer ([place])    CAD (coronary artery disease), native coronary artery 12/22/2016   Cancer ([place]) 11/2018   left breast DCIS   Cardiomyopathy ([place])    Carotid bruit 40/98/1191   Complication of anesthesia    states ear drum burst during hysterectomy surgery   Coronary arteriosclerosis 01/20/2022   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   Essential hypertension 12/28/2014   GERD (gastroesophageal reflux disease)    Headache    r/t sinus   Hearing loss 01/20/2022   History of cardiomyopathy 10/27/2017   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   Hyperlipemia 12/22/2016   Hyperlipidemia    Hypertension    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   11/2018-High grade-lumpectomy-radiation 02/2019-03/14/2019   Mixed hyperlipidemia 01/20/2022   Palpitations    Personal history of radiation therapy     Past Surgical History:  Procedure Laterality Date   ABDOMINAL HYSTERECTOMY     BREAST LUMPECTOMY     BREAST LUMPECTOMY WITH RADIOACTIVE SEED LOCALIZATION Left 01/04/2019   Procedure: LEFT BREAST LUMPECTOMY WITH RADIOACTIVE SEED LOCALIZATION;  Surgeon: Erroll Luna, MD;  Location: [place];  Service: General;  Laterality: Left;   INNER EAR SURGERY     RE-EXCISION OF BREAST LUMPECTOMY Left 01/18/2019   Procedure: RE-EXCISION OF LEFT BREAST LUMPECTOMY;  Surgeon: Erroll Luna, MD;  Location: [place];  Service: General;  Laterality:  Left;   ROTATOR CUFF REPAIR Right    WRIST SURGERY      Current Medications: Current Meds  Medication Sig   Ascorbic Acid (VITAMIN C) 1000 MG tablet Take 1,000 mg by mouth daily.    aspirin 81 MG chewable tablet Chew 81 mg by mouth daily.   BETA CAROTENE PO Take 7,500 Units by mouth daily.   Calcium Carb-Cholecalciferol (CALCIUM-VITAMIN D) 500-200 MG-UNIT tablet Take 2.5 tablets by mouth daily.   carvedilol (COREG) 6.25  MG tablet TAKE 1/2 TABLET BY MOUTH TWICE DAILY   CRANBERRY PO Take 1 tablet by mouth daily.   Magnesium 300 MG CAPS Take 300 mg by mouth as needed (leg cramps).   Multiple Vitamins-Minerals (OCUVITE PO) Take 1 tablet by mouth daily.    Omega-3 1000 MG CAPS Take 2,000 mg by mouth 2 (two) times daily.   omeprazole (PRILOSEC) 20 MG capsule Take 20 mg by mouth daily.   Probiotic CAPS Take 1 capsule by mouth daily.    Specialty Vitamins Products (MENOPAUSE RELIEF PO) Take 731 mg by mouth daily.   UNABLE TO FIND Take 3 capsules by mouth daily as needed (pain). Med Name: Curamin   vitamin B-12 (CYANOCOBALAMIN) 1000 MCG tablet Take 1,000 mcg by mouth daily.    zinc gluconate 50 MG tablet Take 50 mg by mouth daily.      Allergies:   Cortisone   Social History   Socioeconomic History   Marital status: Divorced    Spouse name: Not on file   Number of children: Not on file   Years of education: Not on file   Highest education level: Not on file  Occupational History   Not on file  Tobacco Use   Smoking status: Never   Smokeless tobacco: Never  Vaping Use   Vaping Use: Never used  Substance and Sexual Activity   Alcohol use: Yes    Comment: rare   Drug use: No   Sexual activity: Not on file  Other Topics Concern   Not on file  Social History Narrative   Not on file   Social Determinants of Health   Financial Resource Strain: Not on file  Food Insecurity: Not on file  Transportation Needs: Not on file  Physical Activity: Not on file  Stress: Not on file  Social Connections: Not on file     Family History: The patient's family history includes Valvular heart disease in her brother.  ROS:   Please see the history of present illness.    All other systems reviewed and are negative.  EKGs/Labs/Other Studies Reviewed:    The following studies were reviewed today: I discussed my findings with the patient at length.  EKG revealed sinus rhythm with poor anterior  forces.   Recent Labs: No results found for requested labs within last 365 days.  Recent Lipid Panel    Component Value Date/Time   CHOL 212 (H) 12/27/2020 0901   TRIG 98 12/27/2020 0901   HDL 85 12/27/2020 0901   CHOLHDL 2.5 12/27/2020 0901   LDLCALC 110 (H) 12/27/2020 0901    Physical Exam:    VS:  BP 138/88   Pulse 97   Ht '5\' 1"'$  (1.549 m)   Wt 132 lb 12.8 oz (60.2 kg)   SpO2 97%   BMI 25.09 kg/m     Wt Readings from Last 3 Encounters:  01/21/22 132 lb 12.8 oz (60.2 kg)  07/12/21 134 lb 6.4 oz (61 kg)  12/27/20 132 lb 9.6  oz (60.1 kg)     GEN: Patient is in no acute distress HEENT: Normal NECK: No JVD; No carotid bruits LYMPHATICS: No lymphadenopathy CARDIAC: Hear sounds regular, 2/6 systolic murmur at the apex. RESPIRATORY:  Clear to auscultation without rales, wheezing or rhonchi  ABDOMEN: Soft, non-tender, non-distended MUSCULOSKELETAL:  No edema; No deformity  SKIN: Warm and dry NEUROLOGIC:  Alert and oriented x 3 PSYCHIATRIC:  Normal affect   Signed, Jenean Lindau, MD  01/21/2022 9:06 AM    [place]

## 2022-01-22 LAB — COMPREHENSIVE METABOLIC PANEL
ALT: 13 IU/L (ref 0–32)
AST: 17 IU/L (ref 0–40)
Albumin/Globulin Ratio: 1.8 (ref 1.2–2.2)
Albumin: 4.6 g/dL (ref 3.8–4.8)
Alkaline Phosphatase: 72 IU/L (ref 44–121)
BUN/Creatinine Ratio: 19 (ref 12–28)
BUN: 15 mg/dL (ref 8–27)
Bilirubin Total: 0.4 mg/dL (ref 0.0–1.2)
CO2: 29 mmol/L (ref 20–29)
Calcium: 9.8 mg/dL (ref 8.7–10.3)
Chloride: 103 mmol/L (ref 96–106)
Creatinine, Ser: 0.81 mg/dL (ref 0.57–1.00)
Globulin, Total: 2.5 g/dL (ref 1.5–4.5)
Glucose: 105 mg/dL — ABNORMAL HIGH (ref 70–99)
Potassium: 4.2 mmol/L (ref 3.5–5.2)
Sodium: 142 mmol/L (ref 134–144)
Total Protein: 7.1 g/dL (ref 6.0–8.5)
eGFR: 74 mL/min/{1.73_m2} (ref >59)

## 2022-01-22 LAB — LIPID PANEL
Chol/HDL Ratio: 2.6 ratio (ref 0.0–4.4)
Cholesterol, Total: 232 mg/dL — ABNORMAL HIGH (ref 100–199)
HDL: 89 mg/dL (ref >39)
LDL Chol Calc (NIH): 123 mg/dL — ABNORMAL HIGH (ref 0–99)
Triglycerides: 119 mg/dL (ref 0–149)
VLDL Cholesterol Cal: 20 mg/dL (ref 5–40)

## 2022-01-22 LAB — CBC
Hematocrit: 38 % (ref 34.0–46.6)
Hemoglobin: 12.9 g/dL (ref 11.1–15.9)
MCH: 31.3 pg (ref 26.6–33.0)
MCHC: 33.9 g/dL (ref 31.5–35.7)
MCV: 92 fL (ref 79–97)
Platelets: 225 10*3/uL (ref 150–450)
RBC: 4.12 x10E6/uL (ref 3.77–5.28)
RDW: 12.3 % (ref 11.7–15.4)
WBC: 4.5 10*3/uL (ref 3.4–10.8)

## 2022-01-22 LAB — TSH: TSH: 0.999 u[IU]/mL (ref 0.450–4.500)

## 2022-02-11 DIAGNOSIS — Z6824 Body mass index (BMI) 24.0-24.9, adult: Secondary | ICD-10-CM | POA: Diagnosis not present

## 2022-02-11 DIAGNOSIS — Z1231 Encounter for screening mammogram for malignant neoplasm of breast: Secondary | ICD-10-CM | POA: Diagnosis not present

## 2022-02-11 DIAGNOSIS — Z7689 Persons encountering health services in other specified circumstances: Secondary | ICD-10-CM | POA: Diagnosis not present

## 2022-02-11 DIAGNOSIS — Z01419 Encounter for gynecological examination (general) (routine) without abnormal findings: Secondary | ICD-10-CM | POA: Diagnosis not present

## 2022-03-18 ENCOUNTER — Other Ambulatory Visit: Payer: Medicare Other

## 2022-03-24 ENCOUNTER — Ambulatory Visit: Payer: Medicare Other | Attending: Cardiology

## 2022-03-24 DIAGNOSIS — I428 Other cardiomyopathies: Secondary | ICD-10-CM | POA: Diagnosis not present

## 2022-03-24 DIAGNOSIS — I2510 Atherosclerotic heart disease of native coronary artery without angina pectoris: Secondary | ICD-10-CM

## 2022-03-24 LAB — ECHOCARDIOGRAM COMPLETE
Area-P 1/2: 4.65 cm2
Calc EF: 56.2 %
S' Lateral: 3.2 cm
Single Plane A2C EF: 59.8 %
Single Plane A4C EF: 51.4 %

## 2022-07-10 NOTE — Progress Notes (Signed)
Patient Care Team: Marylen Ponto, MD as PCP - General (Family Medicine) Antony Blackbird, MD as Consulting Physician (Radiation Oncology) Serena Croissant, MD as Consulting Physician (Hematology and Oncology) Harriette Bouillon, MD as Consulting Physician (General Surgery)  DIAGNOSIS:  Encounter Diagnosis  Name Primary?   Ductal carcinoma in situ (DCIS) of left breast Yes    SUMMARY OF ONCOLOGIC HISTORY: Oncology History  Ductal carcinoma in situ (DCIS) of left breast  12/09/2018 Initial Diagnosis   Routine screening mammogram detected left breast calcifications spanning 1.8cm in the UOQ, and an additional group of calcifications in the UOQ spanning 0.4cm. Biopsy DCIS with calcifications, high grade, ER+ 100%, PR+ 10%.    12/09/2018 Cancer Staging   Staging form: Breast, AJCC 8th Edition - Clinical stage from 12/09/2018: Stage 0 (cTis (DCIS), cN0, cM0, ER+, PR+)    01/04/2019 Surgery   Left lumpectomy ([facility]) ([id number]): high grade DCIS, 2.0cm, involved margins. No regional lymph nodes examined.   01/04/2019 Cancer Staging   Staging form: Breast, AJCC 8th Edition - Pathologic stage from 01/04/2019: Stage 0 (pTis (DCIS), pN0, cM0)     01/18/2019 Surgery   Left lumpectomy re-excision ([facility]) ([id number]): medial and inferior margin negative for in situ or invasive carcinoma   02/18/2019 - 03/18/2019 Radiation Therapy   The patient initially received a dose of 40.05 Gy in 15 fractions to the breast using whole-breast tangent fields. This was delivered using a 3-D conformal technique. The pt received a boost delivering an additional 10 Gy in 5 fractions using a electron boost with electrons. The total dose was 50.05 Gy.   03/2019 - 03/2024 Anti-estrogen oral therapy   Tamoxifen     CHIEF COMPLIANT: left breast DCIS    INTERVAL HISTORY: Andrea [name] is a 77 y.o. with above-mentioned history of left breast DCIS. She presents to the clinic for a follow-up. She  reports that she stopped taking the tamoxifen last year in June. She states that she feels a lot better. She does has some tenderness in the breast.    ALLERGIES:  is allergic to cortisone.  MEDICATIONS:  Current Outpatient Medications  Medication Sig Dispense Refill   Ascorbic Acid (VITAMIN C) 1000 MG tablet Take 1,000 mg by mouth daily.      aspirin 81 MG chewable tablet Chew 81 mg by mouth daily.     BETA CAROTENE PO Take 7,500 Units by mouth daily.     CALCIUM-VITAMIN D PO Take 2.5 tablets by mouth daily. 1000 mg unit     carvedilol (COREG) 6.25 MG tablet TAKE 1/2 TABLET BY MOUTH TWICE DAILY 90 tablet 0   CRANBERRY PO Take 1 tablet by mouth daily.     Magnesium 300 MG CAPS Take 300 mg by mouth as needed (leg cramps). As needed     Multiple Vitamins-Minerals (OCUVITE PO) Take 1 tablet by mouth daily.      Omega-3 1000 MG CAPS Take 2,000 mg by mouth 2 (two) times daily.     omeprazole (PRILOSEC) 20 MG capsule Take 20 mg by mouth daily.     Probiotic CAPS Take 1 capsule by mouth daily.      Specialty Vitamins Products (MENOPAUSE RELIEF PO) Take 731 mg by mouth daily.     UNABLE TO FIND Take 3 capsules by mouth daily as needed (pain). Med Name: Curamin     vitamin B-12 (CYANOCOBALAMIN) 1000 MCG tablet Take 1,000 mcg by mouth daily.      zinc gluconate 50 MG tablet Take  50 mg by mouth daily.      No current facility-administered medications for this visit.    PHYSICAL EXAMINATION: ECOG PERFORMANCE STATUS: 1 - Symptomatic but completely ambulatory  Vitals:   07/14/22 0956  BP: 136/75  Pulse: 91  Resp: 18  Temp: 97.9 F (36.6 C)  SpO2: 99%   Filed Weights   07/14/22 0956  Weight: 130 lb 3.2 oz (59.1 kg)      LABORATORY DATA:  I have reviewed the data as listed    Latest Ref Rng & Units 01/21/2022    9:21 AM 12/27/2020    9:01 AM 06/06/2020    8:39 AM  CMP  Glucose 70 - 99 mg/dL 016  010  932   BUN 8 - 27 mg/dL 15  15  13    Creatinine 0.57 - 1.00 mg/dL 3.55  7.32   2.02   Sodium 134 - 144 mmol/L 142  142  143   Potassium 3.5 - 5.2 mmol/L 4.2  4.5  4.2   Chloride 96 - 106 mmol/L 103  103  103   CO2 20 - 29 mmol/L 29  26  24    Calcium 8.7 - 10.3 mg/dL 9.8  54.2  9.4   Total Protein 6.0 - 8.5 g/dL 7.1  6.8  6.8   Total Bilirubin 0.0 - 1.2 mg/dL 0.4  0.3  0.3   Alkaline Phos 44 - 121 IU/L 72  51  51   AST 0 - 40 IU/L 17  17  16    ALT 0 - 32 IU/L 13  10  12      Lab Results  Component Value Date   WBC 4.5 01/21/2022   HGB 12.9 01/21/2022   HCT 38.0 01/21/2022   MCV 92 01/21/2022   PLT 225 01/21/2022   NEUTROABS 1.7 12/27/2020    ASSESSMENT & PLAN:  Ductal carcinoma in situ (DCIS) of left breast Left lumpectomy: DCIS high-grade 2 cm, focally present at the inferior/medial junction margin ER 100%, PR 10%, Tis NX stage 0 Adjuvant radiation: 02/18/2019-03/14/2019   Treatment plan: Adjuvant tamoxifen 20 mg daily x5 years (we will start her at 5 mg daily ) discontinued in June 2023 because of side effects   Breast cancer surveillance: mammogram 01/11/2021: Benign breast density category B   Return to clinic on an as-needed basis.    No orders of the defined types were placed in this encounter.  The patient has a good understanding of the overall plan. she agrees with it. she will call with any problems that may develop before the next visit here. Total time spent: 30 mins including face to face time and time spent for planning, charting and co-ordination of care   Tamsen Meek, MD 07/14/22    I Janan Ridge am acting as a Neurosurgeon for [employer]  I have reviewed the above documentation for accuracy and completeness, and I agree with the above.

## 2022-07-14 ENCOUNTER — Other Ambulatory Visit: Payer: Self-pay

## 2022-07-14 ENCOUNTER — Inpatient Hospital Stay: Payer: Medicare Other | Attending: Hematology and Oncology | Admitting: Hematology and Oncology

## 2022-07-14 VITALS — BP 136/75 | HR 91 | Temp 97.9°F | Resp 18 | Ht 61.0 in | Wt 130.2 lb

## 2022-07-14 DIAGNOSIS — D0512 Intraductal carcinoma in situ of left breast: Secondary | ICD-10-CM | POA: Diagnosis not present

## 2022-07-14 DIAGNOSIS — Z79810 Long term (current) use of selective estrogen receptor modulators (SERMs): Secondary | ICD-10-CM | POA: Diagnosis not present

## 2022-07-14 NOTE — Assessment & Plan Note (Addendum)
Left lumpectomy: DCIS high-grade 2 cm, focally present at the inferior/medial junction margin ER 100%, PR 10%, Tis NX stage 0 Adjuvant radiation: 02/18/2019-03/14/2019   Treatment plan: Adjuvant tamoxifen 20 mg daily x5 years (we will start her at 5 mg daily ) discontinued in June 2023 because of side effects   Breast cancer surveillance: mammogram 01/11/2021: Benign breast density category B   Return to clinic on an as-needed basis.

## 2022-09-26 DIAGNOSIS — D0512 Intraductal carcinoma in situ of left breast: Secondary | ICD-10-CM | POA: Diagnosis not present

## 2022-10-28 DIAGNOSIS — H40013 Open angle with borderline findings, low risk, bilateral: Secondary | ICD-10-CM | POA: Diagnosis not present

## 2022-10-28 DIAGNOSIS — D3132 Benign neoplasm of left choroid: Secondary | ICD-10-CM | POA: Diagnosis not present

## 2022-10-28 DIAGNOSIS — H25813 Combined forms of age-related cataract, bilateral: Secondary | ICD-10-CM | POA: Diagnosis not present

## 2022-10-28 DIAGNOSIS — H524 Presbyopia: Secondary | ICD-10-CM | POA: Diagnosis not present

## 2022-11-02 DIAGNOSIS — I480 Paroxysmal atrial fibrillation: Secondary | ICD-10-CM | POA: Insufficient documentation

## 2022-11-02 HISTORY — DX: Paroxysmal atrial fibrillation: I48.0

## 2022-11-24 NOTE — Progress Notes (Signed)
 " Cardiology Office Note:  .   Date:  11/25/2022  ID:  Lashika Erker, DOB December 14, 1945, MRN [id number] PCP: Ina Marcellus RAMAN, MD  [facility] Providers Cardiologist:  None    History of Present Illness: .   Surya Folden is a 77 y.o. female with a past medical history of CAD, hypertension, cardiomyopathy, GERD, dyslipidemia, carotid bruit, DCIS of left breast, palpitations.  03/24/2022 echocardiogram EF 60 to 65%, grade 1 DD, mild MR 12/16/2017 Holter monitor rare ventricular ectopy, rare APCs 12/16/2017 carotid ultrasound no evidence of stenosis bilaterally 12/16/2017 echo EF 45 to 50%, grade 1 DD, mildly calcified annulus of the mitral valve with mild regurgitation  Most recently evaluated by Dr. Edwyna on 01/21/2022, she was doing well from a cardiac perspective and advised to follow-up in 6 months.  She presents today for follow-up of her hypertension and cardiomyopathy.  She offers no formal complaints from a cardiac perspective.  She has been bothered by leg pain, related to estrogen therapy--her oncologist advised her this should subside with time. She stays active, but not as active as before her breast cancer. She denies chest pain, palpitations, dyspnea, pnd, orthopnea, n, v, dizziness, syncope, edema, weight gain, or early satiety.    ROS: Review of Systems  Constitutional: Negative.      Studies Reviewed: .        Cardiac Studies & Procedures       ECHOCARDIOGRAM  ECHOCARDIOGRAM COMPLETE 03/24/2022  Narrative ECHOCARDIOGRAM REPORT    Patient Name:   Tieara ANN Ruehl Date of Exam: 03/24/2022 Medical Rec #:  [id number]            Height:       61.0 in Accession #:    [id number]           Weight:       132.8 lb Date of Birth:  06/29/1945            BSA:          1.587 m Patient Age:    25 years             BP:           138/88 mmHg Patient Gender: F                    HR:           88 bpm. Exam Location:  [facility]  Procedure: 2D Echo, Cardiac  Doppler, Color Doppler and Strain Analysis  Indications:    Other cardiomyopathy (HCC) [I42.8 (ICD-10-CM)]; Coronary artery disease involving native coronary artery of native heart without angina pectoris [I25.10 (ICD-10-CM)]  History:        Patient has prior history of Echocardiogram examinations, most recent 12/16/2017. CAD; Risk Factors:Hypertension.  Sonographer:    Lynwood Silvas RDCS Referring Phys: CYRUS Skyy Nilan SAUNDERS [facility]  IMPRESSIONS   1. Left ventricular ejection fraction, by estimation, is 60 to 65%. The left ventricle has normal function. The left ventricle has no regional wall motion abnormalities. Left ventricular diastolic parameters are consistent with Grade I diastolic dysfunction (impaired relaxation). 2. Right ventricular systolic function is normal. The right ventricular size is normal. There is normal pulmonary artery systolic pressure. 3. The mitral valve was not well visualized. Mild mitral valve regurgitation. No evidence of mitral stenosis. 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  FINDINGS Left Ventricle: Left ventricular ejection fraction, by estimation, is 60 to 65%. The left ventricle has normal  function. The left ventricle has no regional wall motion abnormalities. The left ventricular internal cavity size was normal in size. There is no left ventricular hypertrophy. Left ventricular diastolic parameters are consistent with Grade I diastolic dysfunction (impaired relaxation).  Right Ventricle: The right ventricular size is normal. No increase in right ventricular wall thickness. Right ventricular systolic function is normal. There is normal pulmonary artery systolic pressure. The tricuspid regurgitant velocity is 2.09 m/s, and with an assumed right atrial pressure of 3 mmHg, the estimated right ventricular systolic pressure is 20.5 mmHg.  Left Atrium: Left atrial size was normal in size.  Right Atrium: Right atrial size was normal in size.  Pericardium:  There is no evidence of pericardial effusion.  Mitral Valve: The mitral valve was not well visualized. 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 Mild mitral valve regurgitation. No evidence of mitral valve stenosis.  Tricuspid Valve: The tricuspid valve is normal in structure. Tricuspid valve regurgitation is not demonstrated. No evidence of tricuspid stenosis.  Aortic Valve: The aortic valve is normal in structure. Aortic valve regurgitation is not visualized. No aortic stenosis is present.  Pulmonic Valve: The pulmonic valve was normal in structure. Pulmonic valve regurgitation is not visualized. No evidence of pulmonic stenosis.  Aorta: The aortic root is normal in size and structure.  Venous: The inferior vena cava is normal in size with greater than 50% respiratory variability, suggesting right atrial pressure of 3 mmHg.  IAS/Shunts: No atrial level shunt detected by color flow Doppler.   LEFT VENTRICLE PLAX 2D LVIDd:         4.30 cm     Diastology LVIDs:         3.20 cm     LV e' medial:    5.00 cm/s LV PW:         0.90 cm     LV E/e' medial:  19.9 LV IVS:        0.90 cm     LV e' lateral:   7.07 cm/s LVOT diam:     2.00 cm     LV E/e' lateral: 14.1 LV SV:         47 LV SV Index:   30 LVOT Area:     3.14 cm  LV Volumes (MOD) LV vol d, MOD A2C: 50.7 ml LV vol d, MOD A4C: 49.8 ml LV vol s, MOD A2C: 20.4 ml LV vol s, MOD A4C: 24.2 ml LV SV MOD A2C:     30.3 ml LV SV MOD A4C:     49.8 ml LV SV MOD BP:      30.2 ml  RIGHT VENTRICLE             IVC RV S prime:     10.20 cm/s  IVC diam: 1.10 cm TAPSE (M-mode): 2.2 cm  LEFT ATRIUM             Index        RIGHT ATRIUM           Index LA diam:        3.50 cm 2.21 cm/m   RA Area:     11.50 cm LA Vol (A2C):   40.0 ml 25.20 ml/m  RA Volume:   22.30 ml  14.05 ml/m LA Vol (A4C):   29.2 ml 18.40 ml/m LA Biplane Vol: 35.5 ml 22.37 ml/m AORTIC VALVE LVOT Vmax:   80.00 cm/s LVOT Vmean:  49.000 cm/s LVOT VTI:    0.150  m  AORTA Ao Root diam: 2.80 cm Ao Asc diam:  2.90 cm Ao Desc diam: 2.00 cm  MITRAL VALVE                TRICUSPID VALVE MV Area (PHT): 4.65 cm     TR Peak grad:   17.5 mmHg MV Decel Time: 163 msec     TR Vmax:        209.00 cm/s MV E velocity: 99.50 cm/s MV A velocity: 115.00 cm/s  SHUNTS MV E/A ratio:  0.87         Systemic VTI:  0.15 m Systemic Diam: 2.00 cm  Chevon Laufer Crape MD Electronically signed by Corrado Hymon Crape MD Signature Date/Time: 03/24/2022/4:21:08 PM    Final             Risk Assessment/Calculations:             Physical Exam:   VS:  BP 120/60 (BP Location: Right Arm, Patient Position: Sitting)   Pulse 79   Ht 5' 2 (1.575 m)   Wt 139 lb 6.4 oz (63.2 kg)   SpO2 99%   BMI 25.50 kg/m    Wt Readings from Last 3 Encounters:  11/25/22 139 lb 6.4 oz (63.2 kg)  07/14/22 130 lb 3.2 oz (59.1 kg)  01/21/22 132 lb 12.8 oz (60.2 kg)    GEN: Well nourished, well developed in no acute distress NECK: No JVD; No carotid bruits CARDIAC: RRR, no murmurs, rubs, gallops RESPIRATORY:  Clear to auscultation without rales, wheezing or rhonchi  ABDOMEN: Soft, non-tender, non-distended EXTREMITIES:  No edema; No deformity   ASSESSMENT AND PLAN: .   Apparent history of coronary artery disease listed in chart- do not see what type specified. Stable with no anginal symptoms. No indication for ischemic evaluation.  Heart healthy diet and regular cardiovascular exercise encouraged.  Continue aspirin 81 mg daily, continue Coreg  3.125 mg twice daily  Cardiomyopathy-most recent echo in January of this year revealed an EF 60 to 65%, grade 1 DD.  NYHA class I, euvolemic.  Continue Coreg  3.125 mg twice daily.  Dyslipidemia-most recent LDL on file for review was elevated, will repeat FLP and LFTs today.  Likely will not be interested in trying any lipid-lowering agents, she prefers a more holistic approach.  Hypertension-blood pressure is well-controlled 120/60, continue Coreg  3.25 mg  twice daily  Fatigue - this has been bothersome for her for some months. Will check her CBC, TSH and B12 for contributory causes.        Dispo: Labs per above, return in 9 months.  Signed, Delon JAYSON Hoover, NP  "

## 2022-11-25 ENCOUNTER — Encounter: Payer: Self-pay | Admitting: Cardiology

## 2022-11-25 ENCOUNTER — Ambulatory Visit: Payer: Medicare Other | Attending: Cardiology | Admitting: Cardiology

## 2022-11-25 VITALS — BP 120/60 | HR 79 | Ht 62.0 in | Wt 139.4 lb

## 2022-11-25 DIAGNOSIS — Z8679 Personal history of other diseases of the circulatory system: Secondary | ICD-10-CM | POA: Diagnosis present

## 2022-11-25 DIAGNOSIS — E782 Mixed hyperlipidemia: Secondary | ICD-10-CM

## 2022-11-25 DIAGNOSIS — R5383 Other fatigue: Secondary | ICD-10-CM | POA: Diagnosis present

## 2022-11-25 DIAGNOSIS — I2510 Atherosclerotic heart disease of native coronary artery without angina pectoris: Secondary | ICD-10-CM | POA: Diagnosis present

## 2022-11-25 DIAGNOSIS — Z79899 Other long term (current) drug therapy: Secondary | ICD-10-CM | POA: Diagnosis present

## 2022-11-25 DIAGNOSIS — I10 Essential (primary) hypertension: Secondary | ICD-10-CM

## 2022-11-25 NOTE — Patient Instructions (Signed)
Medication Instructions:   *If you need a refill on your cardiac medications before your next appointment, please call your pharmacy*   Lab Work:  CBC CMET Lipids TSH B12    If you have labs (blood work) drawn today and your tests are completely normal, you will receive your results only by: MyChart Message (if you have MyChart) OR A paper copy in the mail If you have any lab test that is abnormal or we need to change your treatment, we will call you to review the results.   Testing/Procedures:    Follow-Up: At [facility], you and your health needs are our priority.  As part of our continuing mission to provide you with exceptional heart care, we have created designated Provider Care Teams.  These Care Teams include your primary Cardiologist (physician) and Advanced Practice Providers (APPs -  Physician Assistants and Nurse Practitioners) who all work together to provide you with the care you need, when you need it.  We recommend signing up for the patient portal called "MyChart".  Sign up information is provided on this After Visit Summary.  MyChart is used to connect with patients for Virtual Visits (Telemedicine).  Patients are able to view lab/test results, encounter notes, upcoming appointments, etc.  Non-urgent messages can be sent to your provider as well.   To learn more about what you can do with MyChart, go to [email or web address].    Your next appointment:   9 month(s)  Provider:   Belva Crome, MD    Other Instructions  Keep up the good work!!

## 2022-11-26 ENCOUNTER — Telehealth: Payer: Self-pay | Admitting: Internal Medicine

## 2022-11-26 DIAGNOSIS — D734 Cyst of spleen: Secondary | ICD-10-CM | POA: Diagnosis not present

## 2022-11-26 DIAGNOSIS — Z66 Do not resuscitate: Secondary | ICD-10-CM | POA: Diagnosis not present

## 2022-11-26 DIAGNOSIS — I2510 Atherosclerotic heart disease of native coronary artery without angina pectoris: Secondary | ICD-10-CM | POA: Diagnosis not present

## 2022-11-26 DIAGNOSIS — I4891 Unspecified atrial fibrillation: Secondary | ICD-10-CM | POA: Diagnosis not present

## 2022-11-26 DIAGNOSIS — R195 Other fecal abnormalities: Secondary | ICD-10-CM | POA: Diagnosis not present

## 2022-11-26 DIAGNOSIS — N179 Acute kidney failure, unspecified: Secondary | ICD-10-CM | POA: Diagnosis not present

## 2022-11-26 DIAGNOSIS — R531 Weakness: Secondary | ICD-10-CM | POA: Diagnosis not present

## 2022-11-26 DIAGNOSIS — K219 Gastro-esophageal reflux disease without esophagitis: Secondary | ICD-10-CM | POA: Diagnosis not present

## 2022-11-26 DIAGNOSIS — N281 Cyst of kidney, acquired: Secondary | ICD-10-CM | POA: Diagnosis not present

## 2022-11-26 DIAGNOSIS — I10 Essential (primary) hypertension: Secondary | ICD-10-CM | POA: Diagnosis not present

## 2022-11-26 DIAGNOSIS — R0609 Other forms of dyspnea: Secondary | ICD-10-CM | POA: Diagnosis not present

## 2022-11-26 DIAGNOSIS — Z90710 Acquired absence of both cervix and uterus: Secondary | ICD-10-CM | POA: Diagnosis not present

## 2022-11-26 DIAGNOSIS — D509 Iron deficiency anemia, unspecified: Secondary | ICD-10-CM | POA: Diagnosis not present

## 2022-11-26 DIAGNOSIS — Z86000 Personal history of in-situ neoplasm of breast: Secondary | ICD-10-CM | POA: Diagnosis not present

## 2022-11-26 DIAGNOSIS — D649 Anemia, unspecified: Secondary | ICD-10-CM | POA: Diagnosis not present

## 2022-11-26 DIAGNOSIS — Z923 Personal history of irradiation: Secondary | ICD-10-CM | POA: Diagnosis not present

## 2022-11-26 DIAGNOSIS — R0602 Shortness of breath: Secondary | ICD-10-CM | POA: Diagnosis not present

## 2022-11-26 DIAGNOSIS — K922 Gastrointestinal hemorrhage, unspecified: Secondary | ICD-10-CM | POA: Diagnosis not present

## 2022-11-26 DIAGNOSIS — I083 Combined rheumatic disorders of mitral, aortic and tricuspid valves: Secondary | ICD-10-CM | POA: Diagnosis not present

## 2022-11-26 DIAGNOSIS — E785 Hyperlipidemia, unspecified: Secondary | ICD-10-CM | POA: Diagnosis not present

## 2022-11-26 DIAGNOSIS — I160 Hypertensive urgency: Secondary | ICD-10-CM | POA: Diagnosis not present

## 2022-11-26 DIAGNOSIS — I3139 Other pericardial effusion (noninflammatory): Secondary | ICD-10-CM | POA: Diagnosis not present

## 2022-11-26 DIAGNOSIS — K5730 Diverticulosis of large intestine without perforation or abscess without bleeding: Secondary | ICD-10-CM | POA: Diagnosis not present

## 2022-11-26 DIAGNOSIS — D500 Iron deficiency anemia secondary to blood loss (chronic): Secondary | ICD-10-CM | POA: Diagnosis not present

## 2022-11-26 DIAGNOSIS — Z6825 Body mass index (BMI) 25.0-25.9, adult: Secondary | ICD-10-CM | POA: Diagnosis not present

## 2022-11-26 DIAGNOSIS — E663 Overweight: Secondary | ICD-10-CM | POA: Diagnosis not present

## 2022-11-26 DIAGNOSIS — K5669 Other partial intestinal obstruction: Secondary | ICD-10-CM | POA: Diagnosis not present

## 2022-11-26 DIAGNOSIS — I429 Cardiomyopathy, unspecified: Secondary | ICD-10-CM | POA: Diagnosis not present

## 2022-11-26 HISTORY — DX: Hyperlipidemia, unspecified: E78.5

## 2022-11-26 LAB — CBC WITH DIFFERENTIAL/PLATELET
Basophils Absolute: 0.1 x10E3/uL (ref 0.0–0.2)
Basos: 1 %
EOS (ABSOLUTE): 0.4 x10E3/uL (ref 0.0–0.4)
Eos: 8 %
Hematocrit: 20 % — ABNORMAL LOW (ref 34.0–46.6)
Hemoglobin: 5.1 g/dL — CL (ref 11.1–15.9)
Immature Grans (Abs): 0 x10E3/uL (ref 0.0–0.1)
Immature Granulocytes: 0 %
Lymphocytes Absolute: 1.1 x10E3/uL (ref 0.7–3.1)
Lymphs: 22 %
MCH: 17.5 pg — ABNORMAL LOW (ref 26.6–33.0)
MCHC: 25.5 g/dL — ABNORMAL LOW (ref 31.5–35.7)
MCV: 69 fL — ABNORMAL LOW (ref 79–97)
Monocytes Absolute: 0.7 x10E3/uL (ref 0.1–0.9)
Monocytes: 13 %
Neutrophils Absolute: 2.8 x10E3/uL (ref 1.4–7.0)
Neutrophils: 56 %
Platelets: 411 x10E3/uL (ref 150–450)
RBC: 2.92 x10E6/uL — ABNORMAL LOW (ref 3.77–5.28)
RDW: 19.5 % — ABNORMAL HIGH (ref 11.7–15.4)
WBC: 5 x10E3/uL (ref 3.4–10.8)

## 2022-11-26 LAB — COMPREHENSIVE METABOLIC PANEL WITH GFR
ALT: 9 IU/L (ref 0–32)
AST: 15 IU/L (ref 0–40)
Albumin: 4.1 g/dL (ref 3.8–4.8)
Alkaline Phosphatase: 74 IU/L (ref 44–121)
BUN/Creatinine Ratio: 22 (ref 12–28)
BUN: 19 mg/dL (ref 8–27)
Bilirubin Total: 0.3 mg/dL (ref 0.0–1.2)
CO2: 24 mmol/L (ref 20–29)
Calcium: 9.5 mg/dL (ref 8.7–10.3)
Chloride: 105 mmol/L (ref 96–106)
Creatinine, Ser: 0.86 mg/dL (ref 0.57–1.00)
Globulin, Total: 2.7 g/dL (ref 1.5–4.5)
Glucose: 110 mg/dL — ABNORMAL HIGH (ref 70–99)
Potassium: 4.5 mmol/L (ref 3.5–5.2)
Sodium: 140 mmol/L (ref 134–144)
Total Protein: 6.8 g/dL (ref 6.0–8.5)
eGFR: 69 mL/min/1.73

## 2022-11-26 LAB — LIPID PANEL
Chol/HDL Ratio: 2 ratio (ref 0.0–4.4)
Cholesterol, Total: 180 mg/dL (ref 100–199)
HDL: 90 mg/dL
LDL Chol Calc (NIH): 76 mg/dL (ref 0–99)
Triglycerides: 73 mg/dL (ref 0–149)
VLDL Cholesterol Cal: 14 mg/dL (ref 5–40)

## 2022-11-26 LAB — B12 AND FOLATE PANEL
Folate: 16.1 ng/mL
Vitamin B-12: >2000 pg/mL — ABNORMAL HIGH (ref 232–1245)

## 2022-11-26 LAB — TSH: TSH: 1.03 u[IU]/mL (ref 0.450–4.500)

## 2022-11-26 NOTE — Telephone Encounter (Signed)
I called Ms. Tallo after receiving notification from [facility] regarding a critically low hemoglobin of 5.1 on labs collected earlier today. She reports no recent history of bleeding or melena. She does have a recent history of diarrhea with flax seed oil but has discontinued this with improvement in her symptoms. She does report severe fatigue.  I advised her to present to the nearest emergency department for evaluation and treatment of severe anemia. I made her aware that her hemoglobin is critically low and she is at risk for severe complications if she does not present immediately. She understood and thanked me for the call.

## 2022-11-27 DIAGNOSIS — K5730 Diverticulosis of large intestine without perforation or abscess without bleeding: Secondary | ICD-10-CM | POA: Diagnosis not present

## 2022-11-27 DIAGNOSIS — K922 Gastrointestinal hemorrhage, unspecified: Secondary | ICD-10-CM | POA: Diagnosis not present

## 2022-11-27 DIAGNOSIS — K5669 Other partial intestinal obstruction: Secondary | ICD-10-CM | POA: Diagnosis not present

## 2022-11-27 DIAGNOSIS — D509 Iron deficiency anemia, unspecified: Secondary | ICD-10-CM | POA: Diagnosis not present

## 2022-12-09 ENCOUNTER — Ambulatory Visit: Payer: Medicare Other | Attending: Cardiology | Admitting: Cardiology

## 2022-12-09 ENCOUNTER — Encounter: Payer: Self-pay | Admitting: Cardiology

## 2022-12-09 ENCOUNTER — Ambulatory Visit: Payer: Medicare Other

## 2022-12-09 VITALS — BP 130/74 | HR 78 | Ht 62.0 in | Wt 138.6 lb

## 2022-12-09 DIAGNOSIS — Z8679 Personal history of other diseases of the circulatory system: Secondary | ICD-10-CM

## 2022-12-09 DIAGNOSIS — I480 Paroxysmal atrial fibrillation: Secondary | ICD-10-CM

## 2022-12-09 DIAGNOSIS — E782 Mixed hyperlipidemia: Secondary | ICD-10-CM

## 2022-12-09 DIAGNOSIS — I2510 Atherosclerotic heart disease of native coronary artery without angina pectoris: Secondary | ICD-10-CM | POA: Diagnosis present

## 2022-12-09 DIAGNOSIS — Z79899 Other long term (current) drug therapy: Secondary | ICD-10-CM

## 2022-12-09 DIAGNOSIS — I10 Essential (primary) hypertension: Secondary | ICD-10-CM

## 2022-12-09 DIAGNOSIS — D649 Anemia, unspecified: Secondary | ICD-10-CM

## 2022-12-09 NOTE — Progress Notes (Signed)
Cardiology Office Note:  .   Date:  12/09/2022  ID:  Andrea [name], DOB 26-Sep-1945, MRN [id number] PCP: Patient, No Pcp Per  [facility] Providers Cardiologist:  None    History of Present Illness: .   [name] is a 77 y.o. female with a past medical history of CAD, hypertension, cardiomyopathy, GERD, dyslipidemia, carotid bruit, DCIS of left breast, palpitations.  03/24/2022 echocardiogram EF 60 to 65%, grade 1 DD, mild MR 12/16/2017 Holter monitor rare ventricular ectopy, rare APCs 12/16/2017 carotid ultrasound no evidence of stenosis bilaterally 12/16/2017 echo EF 45 to 50%, grade 1 DD, mildly calcified annulus of the mitral valve with mild regurgitation  Most recently evaluated by Dr. Tomie China on 01/21/2022, she was doing well from a cardiac perspective and advised to follow-up in 6 months.  Recently evaluated for follow-up of her hypertension and cardiomyopathy.  She offers no formal complaints from a cardiac perspective.  She has been bothered by leg pain, related to estrogen therapy--her oncologist advised her this should subside with time.  Repeated basic lab work which revealed hemoglobin of 5.1.  She was advised to go to the hospital and admitted with symptomatic anemia and GI bleed.  She did go into A-fib with RVR, was given IV dose of digoxin and converted back to sinus rhythm.  She clearly need to be anticoagulated however in light of her GI bleed this was not possible.  She underwent EGD followed by colonoscopy but there was concern of sigmoid diverticulosis with muscular hypertrophy that was causing luminal narrowing--plans to follow-up with GI to obtain a CT outpatient. HGB on day of discharge 8.9.  She presents today for follow-up after recent hospital admission as outlined above.  She is feeling much better, hindsight she does endorse that she was feeling fatigued but did not really realize it until after she received the blood transfusions.  We  discussed her episode of atrial fibrillation, this is somewhat a surprise to her as she does not feel this was discussed with her while she was in the hospital. She denies chest pain, palpitations, dyspnea, pnd, orthopnea, n, v, dizziness, syncope, edema, weight gain, hematuria, hemoptysis, hematochezia or early satiety.    ROS: Review of Systems  Constitutional: Negative.      Studies Reviewed: .        Cardiac Studies & Procedures       ECHOCARDIOGRAM  ECHOCARDIOGRAM COMPLETE 03/24/2022  Narrative ECHOCARDIOGRAM REPORT    Patient Name:   Andrea [name] Date of Exam: 03/24/2022 Medical Rec #:  [id number]            Height:       61.0 in Accession #:    [id number]           Weight:       132.8 lb Date of Birth:  10/06/1945            BSA:          1.587 m Patient Age:    60 years             BP:           138/88 mmHg Patient Gender: F                    HR:           88 bpm. Exam Location:  [facility]  Procedure: 2D Echo, Cardiac Doppler, Color Doppler and Strain Analysis  Indications:    Other  cardiomyopathy (HCC) [I42.8 (ICD-10-CM)]; Coronary artery disease involving native coronary artery of native heart without angina pectoris [I25.10 (ICD-10-CM)]  History:        Patient has prior history of Echocardiogram examinations, most recent 12/16/2017. CAD; Risk Factors:Hypertension.  Sonographer:    Louie Boston RDCS Referring Phys: Rito Ehrlich [facility]  IMPRESSIONS   1. Left ventricular ejection fraction, by estimation, is 60 to 65%. The left ventricle has normal function. The left ventricle has no regional wall motion abnormalities. Left ventricular diastolic parameters are consistent with Grade I diastolic dysfunction (impaired relaxation). 2. Right ventricular systolic function is normal. The right ventricular size is normal. There is normal pulmonary artery systolic pressure. 3. The mitral valve was not well visualized. Mild mitral valve regurgitation. No evidence of  mitral stenosis. 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  FINDINGS Left Ventricle: Left ventricular ejection fraction, by estimation, is 60 to 65%. The left ventricle has normal function. The left ventricle has no regional wall motion abnormalities. The left ventricular internal cavity size was normal in size. There is no left ventricular hypertrophy. Left ventricular diastolic parameters are consistent with Grade I diastolic dysfunction (impaired relaxation).  Right Ventricle: The right ventricular size is normal. No increase in right ventricular wall thickness. Right ventricular systolic function is normal. There is normal pulmonary artery systolic pressure. The tricuspid regurgitant velocity is 2.09 m/s, and with an assumed right atrial pressure of 3 mmHg, the estimated right ventricular systolic pressure is 20.5 mmHg.  Left Atrium: Left atrial size was normal in size.  Right Atrium: Right atrial size was normal in size.  Pericardium: There is no evidence of pericardial effusion.  Mitral Valve: The mitral valve was not well visualized. 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 Mild mitral valve regurgitation. No evidence of mitral valve stenosis.  Tricuspid Valve: The tricuspid valve is normal in structure. Tricuspid valve regurgitation is not demonstrated. No evidence of tricuspid stenosis.  Aortic Valve: The aortic valve is normal in structure. Aortic valve regurgitation is not visualized. No aortic stenosis is present.  Pulmonic Valve: The pulmonic valve was normal in structure. Pulmonic valve regurgitation is not visualized. No evidence of pulmonic stenosis.  Aorta: The aortic root is normal in size and structure.  Venous: The inferior vena cava is normal in size with greater than 50% respiratory variability, suggesting right atrial pressure of 3 mmHg.  IAS/Shunts: No atrial level shunt detected by color flow Doppler.   LEFT VENTRICLE PLAX 2D LVIDd:         4.30 cm      Diastology LVIDs:         3.20 cm     LV e' medial:    5.00 cm/s LV PW:         0.90 cm     LV E/e' medial:  19.9 LV IVS:        0.90 cm     LV e' lateral:   7.07 cm/s LVOT diam:     2.00 cm     LV E/e' lateral: 14.1 LV SV:         47 LV SV Index:   30 LVOT Area:     3.14 cm  LV Volumes (MOD) LV vol d, MOD A2C: 50.7 ml LV vol d, MOD A4C: 49.8 ml LV vol s, MOD A2C: 20.4 ml LV vol s, MOD A4C: 24.2 ml LV SV MOD A2C:     30.3 ml LV SV MOD A4C:  49.8 ml LV SV MOD BP:      30.2 ml  RIGHT VENTRICLE             IVC RV S prime:     10.20 cm/s  IVC diam: 1.10 cm TAPSE (M-mode): 2.2 cm  LEFT ATRIUM             Index        RIGHT ATRIUM           Index LA diam:        3.50 cm 2.21 cm/m   RA Area:     11.50 cm LA Vol (A2C):   40.0 ml 25.20 ml/m  RA Volume:   22.30 ml  14.05 ml/m LA Vol (A4C):   29.2 ml 18.40 ml/m LA Biplane Vol: 35.5 ml 22.37 ml/m AORTIC VALVE LVOT Vmax:   80.00 cm/s LVOT Vmean:  49.000 cm/s LVOT VTI:    0.150 m  AORTA Ao Root diam: 2.80 cm Ao Asc diam:  2.90 cm Ao Desc diam: 2.00 cm  MITRAL VALVE                TRICUSPID VALVE MV Area (PHT): 4.65 cm     TR Peak grad:   17.5 mmHg MV Decel Time: 163 msec     TR Vmax:        209.00 cm/s MV E velocity: 99.50 cm/s MV A velocity: 115.00 cm/s  SHUNTS MV E/A ratio:  0.87         Systemic VTI:  0.15 m Systemic Diam: 2.00 cm  Belva Crome MD Electronically signed by Belva Crome MD Signature Date/Time: 03/24/2022/4:21:08 PM    Final             Risk Assessment/Calculations:             Physical Exam:   VS:  BP 130/74 (BP Location: Left Arm, Patient Position: Sitting)   Pulse 78   Ht 5\' 2"  (1.575 m)   Wt 138 lb 9.6 oz (62.9 kg)   SpO2 96%   BMI 25.35 kg/m    Wt Readings from Last 3 Encounters:  12/09/22 138 lb 9.6 oz (62.9 kg)  11/25/22 139 lb 6.4 oz (63.2 kg)  07/14/22 130 lb 3.2 oz (59.1 kg)    GEN: Well nourished, well developed in no acute distress NECK: No JVD; No carotid  bruits CARDIAC: RRR, no murmurs, rubs, gallops RESPIRATORY:  Clear to auscultation without rales, wheezing or rhonchi  ABDOMEN: Soft, non-tender, non-distended EXTREMITIES:  No edema; No deformity   ASSESSMENT AND PLAN: .   PAF/hypercoagulable state-1 episode according to hospital records, she was changed from Coreg to carvedilol, not started on DOAC secondary to concerns for GI bleed and anemia.  Will arrange for a 2-week monitor for atrial fibrillation burden, and will not start DOAC today as she has not been cleared by GI.  GI bleed/anemia-patient states that no sources of GI bleed have been found however per review of hospital records it appears she needs to establish with GI and have a CT of her abdomen.  Will repeat CBC today.  Apparent history of coronary artery disease listed in chart- do not see what type specified. Stable with no anginal symptoms. No indication for ischemic evaluation.  Heart healthy diet and regular cardiovascular exercise encouraged.  Continue aspirin 81 mg daily, continue Coreg 3.125 mg twice daily  Cardiomyopathy-most recent echo in January of this year revealed an EF 60 to 65%, grade 1 DD.  NYHA class I, euvolemic.  Continue metoprolol 50 mg twice daily.  Dyslipidemia-most recent LDL on file for review was elevated, will repeat FLP and LFTs today.  Likely will not be interested in trying any lipid-lowering agents, she prefers a more holistic approach.  Hypertension-blood pressure is controlled at 130/74, continue metoprolol 50 mg twice daily     Dispo: Monitor, CBC, return to Dr. Tomie China in 6 weeks.   Signed, Flossie Dibble, NP

## 2022-12-09 NOTE — Patient Instructions (Signed)
Medication Instructions:  Your physician recommends that you continue on your current medications as directed. Please refer to the Current Medication list given to you today.  *If you need a refill on your cardiac medications before your next appointment, please call your pharmacy*   Lab Work: Your physician recommends that you have a CBC today in the office.  If you have labs (blood work) drawn today and your tests are completely normal, you will receive your results only by: MyChart Message (if you have MyChart) OR A paper copy in the mail If you have any lab test that is abnormal or we need to change your treatment, we will call you to review the results.   Testing/Procedures: A zio monitor was ordered today. It will remain on for 14 days. Remove 12/23/22. You will then return monitor and event diary in provided box. It takes 1-2 weeks for report to be downloaded and returned to [place]. We will call you with the results. If monitor falls off or has orange flashing light, please call [employer] for further instructions.    Follow-Up: At [facility], you and your health needs are our priority.  As part of our continuing mission to provide you with exceptional heart care, we have created designated Provider Care Teams.  These Care Teams include your primary Cardiologist (physician) and Advanced Practice Providers (APPs -  Physician Assistants and Nurse Practitioners) who all work together to provide you with the care you need, when you need it.  We recommend signing up for the patient portal called "MyChart".  Sign up information is provided on this After Visit Summary.  MyChart is used to connect with patients for Virtual Visits (Telemedicine).  Patients are able to view lab/test results, encounter notes, upcoming appointments, etc.  Non-urgent messages can be sent to your provider as well.   To learn more about what you can do with MyChart, go to [email or web address].    Your next  appointment:   6 week(s)  The format for your next appointment:   In Person  Provider:   Belva Crome, MD    Other Instructions none  Important Information About Sugar

## 2022-12-10 LAB — CBC
Hematocrit: 31.4 % — ABNORMAL LOW (ref 34.0–46.6)
Hemoglobin: 8.7 g/dL — ABNORMAL LOW (ref 11.1–15.9)
MCH: 21 pg — ABNORMAL LOW (ref 26.6–33.0)
MCHC: 27.7 g/dL — ABNORMAL LOW (ref 31.5–35.7)
MCV: 76 fL — ABNORMAL LOW (ref 79–97)
Platelets: 459 x10E3/uL — ABNORMAL HIGH (ref 150–450)
RBC: 4.15 x10E6/uL (ref 3.77–5.28)
RDW: 24.9 % — ABNORMAL HIGH (ref 11.7–15.4)
WBC: 6 x10E3/uL (ref 3.4–10.8)

## 2022-12-11 ENCOUNTER — Telehealth: Payer: Self-pay | Admitting: Cardiology

## 2022-12-11 ENCOUNTER — Other Ambulatory Visit: Payer: Self-pay | Admitting: Cardiology

## 2022-12-11 MED ORDER — AMLODIPINE BESYLATE 5 MG PO TABS: 5.0000 mg | ORAL_TABLET | Freq: Every day | ORAL | 3 refills | Status: DC

## 2022-12-11 MED ORDER — METOPROLOL SUCCINATE ER 50 MG PO TB24: 50.0000 mg | ORAL_TABLET | Freq: Two times a day (BID) | ORAL | 3 refills | Status: DC

## 2022-12-11 NOTE — Telephone Encounter (Signed)
Rx refill sent to pharmacy. 

## 2022-12-11 NOTE — Telephone Encounter (Signed)
RX sent

## 2022-12-11 NOTE — Telephone Encounter (Signed)
*  STAT* If patient is at the pharmacy, call can be transferred to refill team.   1. Which medications need to be refilled? (please list name of each medication and dose if known)   metoprolol succinate (TOPROL-XL) 50 MG 24 hr tablet  (new prescription) amLODipine (NORVASC) 5 MG tablet  (still has medication left)  2. Would you like to learn more about the convenience, safety, & potential cost savings by using the [facility]?   3. Are you open to using the [facility] (Type [facility]. ).  4. Which pharmacy/location (including street and city if local pharmacy) is medication to be sent to?  [id number] - [place], [place] - [address]   5. Do they need a 30 day or 90 day supply?   90 day  Patient stated she has not started on the metoprolol and is adding the amlodipine to this pharmacy.

## 2022-12-12 ENCOUNTER — Telehealth: Payer: Self-pay | Admitting: *Deleted

## 2022-12-12 NOTE — Telephone Encounter (Signed)
-----   Message from Flossie Dibble sent at 12/10/2022 10:43 AM EDT ----- Hemoglobin is 8.7, which is acceptable. But she needs to follow up with the GI specialist that was mentioned on her d/c instructions.   "Patient underwent EGD followed by a colonoscopy but the colonoscopy was aborted because of sigmoid diverticulosis with muscular hypertrophy causing luminal narrowing; she will follow up with GI to obtain a CT colonography as an outpatient. Follow up with Dr Almetta Lovely to get a CT colonography"

## 2022-12-12 NOTE — Telephone Encounter (Signed)
Mailbox is full and so unable to leave message

## 2022-12-15 ENCOUNTER — Other Ambulatory Visit: Payer: Self-pay

## 2022-12-15 ENCOUNTER — Encounter: Payer: Self-pay | Admitting: *Deleted

## 2022-12-15 NOTE — Telephone Encounter (Signed)
Richard spoke with pt and gave results and instructions

## 2022-12-15 NOTE — Telephone Encounter (Signed)
Sent pt letter with results and [facility] instructions. Unable to reach by phone

## 2022-12-18 DIAGNOSIS — Z23 Encounter for immunization: Secondary | ICD-10-CM | POA: Diagnosis not present

## 2022-12-29 DIAGNOSIS — I480 Paroxysmal atrial fibrillation: Secondary | ICD-10-CM | POA: Diagnosis not present

## 2023-01-02 ENCOUNTER — Other Ambulatory Visit: Payer: Self-pay | Admitting: Cardiology

## 2023-01-05 ENCOUNTER — Telehealth: Payer: Self-pay

## 2023-01-05 ENCOUNTER — Telehealth: Payer: Self-pay | Admitting: Cardiology

## 2023-01-05 NOTE — Telephone Encounter (Signed)
Patient needs to see MD instead of NP/called to move appt on 11/19 to the next day.  LVM for pt to call back/kbl 01/05/23

## 2023-01-05 NOTE — Telephone Encounter (Signed)
Spoke with pt regarding Jimmy Footman note. Pt stated that she has not seen GI yet. She stated that she was not going to take a blood thinner. Advised to talk with Dr. Tomie China at her upcoming appt regarding the A-fib and anticoagulation. Pt verbalized understanding and had no further questions.

## 2023-01-06 NOTE — Telephone Encounter (Signed)
Called patient to move to Dr. Kem Parkinson schedule  VM full/kbl 11/05

## 2023-01-07 NOTE — Telephone Encounter (Signed)
Patient returning call. She says to call her home number because she is having issues with her mobile phone.

## 2023-01-08 NOTE — Telephone Encounter (Signed)
Patient came in to the office today to resolve appt rescheduling. She is now seeing Dr. Tomie China on the 14th. Patient is agreeable.

## 2023-01-14 ENCOUNTER — Other Ambulatory Visit: Payer: Self-pay

## 2023-01-15 ENCOUNTER — Encounter: Payer: Self-pay | Admitting: Cardiology

## 2023-01-15 ENCOUNTER — Ambulatory Visit: Payer: Medicare Other | Attending: Cardiology | Admitting: Cardiology

## 2023-01-15 VITALS — BP 148/80 | HR 112 | Ht 61.0 in | Wt 142.0 lb

## 2023-01-15 DIAGNOSIS — Z8679 Personal history of other diseases of the circulatory system: Secondary | ICD-10-CM | POA: Insufficient documentation

## 2023-01-15 DIAGNOSIS — I480 Paroxysmal atrial fibrillation: Secondary | ICD-10-CM | POA: Diagnosis not present

## 2023-01-15 DIAGNOSIS — E782 Mixed hyperlipidemia: Secondary | ICD-10-CM | POA: Diagnosis not present

## 2023-01-15 MED ORDER — METOPROLOL SUCCINATE ER 100 MG PO TB24
ORAL_TABLET | ORAL | 3 refills | Status: DC
Start: 2023-01-15 — End: 2023-04-07

## 2023-01-15 NOTE — Addendum Note (Signed)
Addended by: Ia Leeb, Elmarie Shiley L on: 01/15/2023 03:31 PM   Modules accepted: Orders

## 2023-01-15 NOTE — Patient Instructions (Addendum)
Medication Instructions:  Discontinue Amlodipine 5mg  daily Start Metoprolol Succinate 100 mg- Take 100 mg by mouth in the morning and take 50 mg by mouth in the evening.  *If you need a refill on your cardiac medications before your next appointment, please call your pharmacy* Please keep a BP log for 1 weeks and send by MyChart or mail.  Blood Pressure Record Sheet To take your blood pressure, you will need a blood pressure machine. You can buy a blood pressure machine (blood pressure monitor) at your clinic, drug store, or online. When choosing one, consider: An automatic monitor that has an arm cuff. A cuff that wraps snugly around your upper arm. You should be able to fit only one finger between your arm and the cuff. A device that stores blood pressure reading results. Do not choose a monitor that measures your blood pressure from your wrist or finger. Follow your health care provider's instructions for how to take your blood pressure. To use this form: Get one reading in the morning (a.m.) 1-2 hours after you take any medicines. Get one reading in the evening (p.m.) before supper.   Blood pressure log Date: _______________________  a.m. _____________________(1st reading) HR___________            p.m. _____________________(2nd reading) HR__________  Date: _______________________  a.m. _____________________(1st reading) HR___________            p.m. _____________________(2nd reading) HR__________  Date: _______________________  a.m. _____________________(1st reading) HR___________            p.m. _____________________(2nd reading) HR__________  Date: _______________________  a.m. _____________________(1st reading) HR___________            p.m. _____________________(2nd reading) HR__________  Date: _______________________  a.m. _____________________(1st reading) HR___________            p.m. _____________________(2nd reading) HR__________  Date:  _______________________  a.m. _____________________(1st reading) HR___________            p.m. _____________________(2nd reading) HR__________  Date: _______________________  a.m. _____________________(1st reading) HR___________            p.m. _____________________(2nd reading) HR__________   This information is not intended to replace advice given to you by your health care provider. Make sure you discuss any questions you have with your health care provider. Document Revised: 06/08/2019 Document Reviewed: 06/08/2019 Elsevier Patient Education  2021 [employer].    Lab Work: [employer], CBC If you have labs (blood work) drawn today and your tests are completely normal, you will receive your results only by: [employer] (if you have MyChart) OR A paper copy in the mail If you have any lab test that is abnormal or we need to change your treatment, we will call you to review the results.   Testing/Procedures: None   Follow-Up: At [facility], you and your health needs are our priority.  As part of our continuing mission to provide you with exceptional heart care, we have created designated Provider Care Teams.  These Care Teams include your primary Cardiologist (physician) and Advanced Practice Providers (APPs -  Physician Assistants and Nurse Practitioners) who all work together to provide you with the care you need, when you need it.  We recommend signing up for the patient portal called "MyChart".  Sign up information is provided on this After Visit Summary.  MyChart is used to connect with patients for Virtual Visits (Telemedicine).  Patients are able to view lab/test results, encounter notes, upcoming appointments, etc.  Non-urgent messages can be sent to  your provider as well.   To learn more about what you can do with MyChart, go to [email or web address].    Your next appointment:   3 month(s)  Provider:   Belva Crome, MD    Other Instructions

## 2023-01-15 NOTE — Progress Notes (Signed)
Cardiology Office Note:    Date:  01/15/2023   ID:  [name], DOB 29-Jul-1945, MRN [id number]  PCP:  Patient, No Pcp Per  Cardiologist:  Garwin Brothers, MD   Referring MD: No ref. provider found    ASSESSMENT:    1. Paroxysmal A-fib (HCC)   2. History of cardiomyopathy   3. Mixed hyperlipidemia    PLAN:    In order of problems listed above:  Primary prevention stressed with the patient.  Importance of compliance with diet medication stressed and patient verbalized standing. History of cardiomyopathy: Last ejection fraction is stable. Paroxysmal atrial fibrillation: I discussed atrial fibrillation.  I discussed anticoagulation.  She is vehemently against it.  She mentions to me that she was anemic so therefore she feels she went into atrial fibrillation.  I Wanted to do a 2-week monitor to assess that she is vehemently against it.  Benefits and risks of anticoagulation and explained and she vocalized understanding questions were answered to her satisfaction. She was advised to walk on a regular basis.  She promises to do so.  I wanted to check her hemoglobin but she mentions to me that her medical doctors are following this very closely. Patient will be seen in follow-up appointment in 6 months or earlier if the patient has any concerns.    Medication Adjustments/Labs and Tests Ordered: Current medicines are reviewed at length with the patient today.  Concerns regarding medicines are outlined above.  No orders of the defined types were placed in this encounter.  No orders of the defined types were placed in this encounter.    No chief complaint on file.    History of Present Illness:    Kerra Mercuri is a 77 y.o. female.  Patient has past medical history of cardiomyopathy.  She gives history of palpitations and.  Monitoring which showed sustained atrial fibrillation which was paroxysmal in nature.  She was also found to be severely anemic.  She mentions  to me that she received 2 units of blood.  She underwent endoscopy upper and lower and tells me that they could not find any significant reason for the bleeding.  She is denying any dark stools or any such issues at this time.  At the time of my evaluation, the patient is alert awake oriented and in no distress. 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   Hyperlipemia 12/22/2016   Hyperlipidemia    Hypertension    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   11/2018-High grade-lumpectomy-radiation 02/2019-03/14/2019   Mixed hyperlipidemia 01/20/2022   Palpitations    Paroxysmal A-fib (HCC) 11/2022   Personal history of radiation therapy     Past Surgical History:  Procedure Laterality Date   ABDOMINAL HYSTERECTOMY     BREAST LUMPECTOMY     BREAST LUMPECTOMY WITH  RADIOACTIVE SEED LOCALIZATION Left 01/04/2019   Procedure: LEFT BREAST LUMPECTOMY WITH RADIOACTIVE SEED LOCALIZATION;  Surgeon: Harriette Bouillon, MD;  Location: [facility];  Service: General;  Laterality: Left;   INNER EAR SURGERY     RE-EXCISION OF BREAST LUMPECTOMY Left 01/18/2019   Procedure: RE-EXCISION OF LEFT BREAST LUMPECTOMY;  Surgeon: Harriette Bouillon, MD;  Location: [facility];  Service: General;  Laterality: Left;   ROTATOR CUFF REPAIR Right    WRIST SURGERY      Current  Medications: Current Meds  Medication Sig   Ascorbic Acid (VITAMIN C) 1000 MG tablet Take 1,000 mg by mouth daily.    CRANBERRY PO Take 8,400 mg by mouth daily.   Magnesium 300 MG CAPS Take 300 mg by mouth as needed (leg cramps).   Multiple Vitamins-Minerals (OCUVITE PO) Take 1 tablet by mouth daily.    Omega-3 1000 MG CAPS Take 2,000 mg by mouth 2 (two) times daily.   omeprazole (PRILOSEC) 20 MG capsule Take 20 mg by mouth daily.   OVER THE COUNTER MEDICATION Take 2 tablets by mouth 2 (two) times daily.   Probiotic CAPS Take 1 capsule by mouth daily.    Specialty Vitamins Products (MENOPAUSE RELIEF PO) Take 731 mg by mouth daily.   vitamin B-12 (CYANOCOBALAMIN) 1000 MCG tablet Take 1,000 mcg by mouth daily.    zinc gluconate 50 MG tablet Take 50 mg by mouth daily.    [DISCONTINUED] amLODipine (NORVASC) 5 MG tablet Take 1 tablet (5 mg total) by mouth daily.   [DISCONTINUED] metoprolol succinate (TOPROL-XL) 50 MG 24 hr tablet Take 1 tablet (50 mg total) by mouth 2 (two) times daily.     Allergies:   Cortisone   Social History   Socioeconomic History   Marital status: Divorced    Spouse name: Not on file   Number of children: Not on file   Years of education: Not on file   Highest education level: Not on file  Occupational History   Not on file  Tobacco Use   Smoking status: Never   Smokeless tobacco: Never  Vaping Use   Vaping status: Never Used  Substance and Sexual Activity   Alcohol use: Yes    Comment: rare   Drug use: No   Sexual activity: Not on file  Other Topics Concern   Not on file  Social History Narrative   Not on file   Social Determinants of Health   Financial Resource Strain: Not on file  Food Insecurity: No Food Insecurity (11/26/2022)   Received from [facility]   Hunger Vital Sign    Worried About Running Out of Food in the Last Year: Never true    Ran Out of Food in the Last Year: Never true  Transportation Needs: No Transportation Needs  (11/27/2022)   Received from [facility] - Transportation    Lack of Transportation (Medical): No    Lack of Transportation (Non-Medical): No  Physical Activity: Not on file  Stress: No Stress Concern Present (11/26/2022)   Received from [facility] - Occupational Stress Questionnaire    Feeling of Stress : Not at all  Social Connections: Unknown (11/26/2022)   Received from [facility]   Social Network    Social Network: Not on file     Family History: The patient's family history includes Valvular heart disease in her brother.  ROS:   Please see the history of present  illness.    All other systems reviewed and are negative.  EKGs/Labs/Other Studies Reviewed:    The following studies were reviewed today: I discussed my findings with the patient at length.   Recent Labs: 11/25/2022: ALT 9; BUN 19; Creatinine, Ser 0.86; Potassium 4.5; Sodium 140; TSH 1.030 12/09/2022: Hemoglobin 8.7; Platelets 459  Recent Lipid Panel    Component Value Date/Time   CHOL 180 11/25/2022 0921   TRIG 73 11/25/2022 0921   HDL 90 11/25/2022 0921   CHOLHDL 2.0 11/25/2022 0921   LDLCALC 76 11/25/2022 0921    Physical Exam:    VS:  BP (!) 148/80   Pulse (!) 112   Ht 5\' 1"  (1.549 m)   Wt 142 lb (64.4 kg)   SpO2 98%   BMI 26.83 kg/m     Wt Readings from Last 3 Encounters:  01/15/23 142 lb (64.4 kg)  12/09/22 138 lb 9.6 oz (62.9 kg)  11/25/22 139 lb 6.4 oz (63.2 kg)     GEN: Patient is in no acute distress HEENT: Normal NECK: No JVD; No carotid bruits LYMPHATICS: No lymphadenopathy CARDIAC: Hear sounds regular, 2/6 systolic murmur at the apex. RESPIRATORY:  Clear to auscultation without rales, wheezing or rhonchi  ABDOMEN: Soft, non-tender, non-distended MUSCULOSKELETAL:  No edema; No deformity  SKIN: Warm and dry NEUROLOGIC:  Alert and oriented x 3 PSYCHIATRIC:  Normal affect   Signed, Garwin Brothers, MD  01/15/2023  3:23 PM    [facility]

## 2023-01-16 LAB — BASIC METABOLIC PANEL
BUN/Creatinine Ratio: 26 (ref 12–28)
BUN: 21 mg/dL (ref 8–27)
CO2: 21 mmol/L (ref 20–29)
Calcium: 9.3 mg/dL (ref 8.7–10.3)
Chloride: 105 mmol/L (ref 96–106)
Creatinine, Ser: 0.81 mg/dL (ref 0.57–1.00)
Glucose: 108 mg/dL — ABNORMAL HIGH (ref 70–99)
Potassium: 4.4 mmol/L (ref 3.5–5.2)
Sodium: 143 mmol/L (ref 134–144)
eGFR: 74 mL/min/{1.73_m2} (ref >59)

## 2023-01-16 LAB — CBC
Hematocrit: 25.6 % — ABNORMAL LOW (ref 34.0–46.6)
Hemoglobin: 7 g/dL — ABNORMAL LOW (ref 11.1–15.9)
MCH: 20.3 pg — ABNORMAL LOW (ref 26.6–33.0)
MCHC: 27.3 g/dL — ABNORMAL LOW (ref 31.5–35.7)
MCV: 74 fL — ABNORMAL LOW (ref 79–97)
Platelets: 386 10*3/uL (ref 150–450)
RBC: 3.44 x10E6/uL — ABNORMAL LOW (ref 3.77–5.28)
RDW: 23.2 % — ABNORMAL HIGH (ref 11.7–15.4)
WBC: 5.7 10*3/uL (ref 3.4–10.8)

## 2023-01-20 ENCOUNTER — Ambulatory Visit: Payer: Medicare Other | Admitting: Cardiology

## 2023-01-26 ENCOUNTER — Telehealth: Payer: Self-pay

## 2023-01-26 DIAGNOSIS — D649 Anemia, unspecified: Secondary | ICD-10-CM

## 2023-01-26 NOTE — Telephone Encounter (Signed)
-----   Message from Aundra Dubin Revankar sent at 01/21/2023 11:30 AM EST ----- Hemoglobin has dropped.  Best to see primary care ASAP.  Please call primary care and/or gastroenterology office and let their nurse know about this. Garwin Brothers, MD 01/21/2023 11:29 AM

## 2023-01-26 NOTE — Telephone Encounter (Signed)
No answer on cell phone and home number is busy

## 2023-01-26 NOTE — Telephone Encounter (Signed)
Spoke with pt and advised as Dr. Kem Parkinson recommendations. Pt does not have a PCP as she states she fired them. Urgent referral has been placed for [facility]. Advised to go to Urgent Care/ER for evaluation. Pt states that she feels fine and not like before when her hgb was 4. Pt verbalized understanding and had no additional questions.

## 2023-02-17 DIAGNOSIS — K219 Gastro-esophageal reflux disease without esophagitis: Secondary | ICD-10-CM | POA: Diagnosis not present

## 2023-02-17 DIAGNOSIS — Z01419 Encounter for gynecological examination (general) (routine) without abnormal findings: Secondary | ICD-10-CM | POA: Diagnosis not present

## 2023-02-17 DIAGNOSIS — Z1231 Encounter for screening mammogram for malignant neoplasm of breast: Secondary | ICD-10-CM | POA: Diagnosis not present

## 2023-02-17 DIAGNOSIS — Z6826 Body mass index (BMI) 26.0-26.9, adult: Secondary | ICD-10-CM | POA: Diagnosis not present

## 2023-02-17 DIAGNOSIS — N958 Other specified menopausal and perimenopausal disorders: Secondary | ICD-10-CM | POA: Diagnosis not present

## 2023-02-17 DIAGNOSIS — Z13820 Encounter for screening for osteoporosis: Secondary | ICD-10-CM | POA: Diagnosis not present

## 2023-02-17 LAB — HM MAMMOGRAPHY

## 2023-02-19 ENCOUNTER — Encounter: Payer: Self-pay | Admitting: Family Medicine

## 2023-02-19 ENCOUNTER — Ambulatory Visit (INDEPENDENT_AMBULATORY_CARE_PROVIDER_SITE_OTHER): Payer: Medicare Other | Admitting: Family Medicine

## 2023-02-19 VITALS — BP 136/70 | HR 91 | Temp 97.8°F | Ht 61.0 in | Wt 145.0 lb

## 2023-02-19 DIAGNOSIS — R739 Hyperglycemia, unspecified: Secondary | ICD-10-CM

## 2023-02-19 DIAGNOSIS — D500 Iron deficiency anemia secondary to blood loss (chronic): Secondary | ICD-10-CM | POA: Insufficient documentation

## 2023-02-19 DIAGNOSIS — D649 Anemia, unspecified: Secondary | ICD-10-CM | POA: Diagnosis not present

## 2023-02-19 NOTE — Progress Notes (Signed)
New Patient Office Visit  Subjective    Patient ID: Andrea [name], female    DOB: Sep 08, 1945  Age: 77 y.o. MRN: [id number]  CC:  Chief Complaint  Patient presents with   Establish Care    HPI Andrea [name] presents to establish care. States she needs to get a PCP due to having a issue with unexplainable blood loss which resulted in a blood transfusion (2 units) colonoscopy and egd were normal per patient.   Outpatient Encounter Medications as of 02/19/2023  Medication Sig   Ascorbic Acid (VITAMIN C) 1000 MG tablet Take 1,000 mg by mouth daily.    CRANBERRY PO Take 8,400 mg by mouth daily.   metoprolol succinate (TOPROL-XL) 100 MG 24 hr tablet Take 1 tablet (100 mg total) by mouth every morning AND 0.5 tablets (50 mg total) at bedtime. Take with or immediately following a meal..   Multiple Vitamins-Minerals (OCUVITE PO) Take 1 tablet by mouth daily.    Omega-3 1000 MG CAPS Take 2,000 mg by mouth 2 (two) times daily.   omeprazole (PRILOSEC) 20 MG capsule Take 20 mg by mouth daily.   OVER THE COUNTER MEDICATION Take 2 tablets by mouth 2 (two) times daily.   Probiotic CAPS Take 1 capsule by mouth daily.    Specialty Vitamins Products (MENOPAUSE RELIEF PO) Take 731 mg by mouth daily.   vitamin B-12 (CYANOCOBALAMIN) 1000 MCG tablet Take 1,000 mcg by mouth daily.    zinc gluconate 50 MG tablet Take 50 mg by mouth daily.    [DISCONTINUED] Magnesium 300 MG CAPS Take 300 mg by mouth as needed (leg cramps).   No facility-administered encounter medications on file as of 02/19/2023.    Past Medical History:  Diagnosis Date   Breast cancer (HCC)    CAD (coronary artery disease), native coronary artery 12/22/2016   Cancer (HCC) 11/2018   left breast DCIS   Cardiomyopathy (HCC)    Carotid bruit 10/27/2017   Complication of anesthesia    states ear drum burst during hysterectomy surgery   Coronary arteriosclerosis 01/20/2022   Ductal carcinoma in situ (DCIS) of left breast  12/09/2018   Dyslipidemia 11/26/2022   Essential hypertension 12/28/2014   GERD (gastroesophageal reflux disease)    Headache    r/t sinus   Hearing loss 01/20/2022   History of cardiomyopathy 10/27/2017   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   Hyperlipemia 12/22/2016   Hyperlipidemia    Hypertension    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   11/2018-High grade-lumpectomy-radiation 02/2019-03/14/2019   Mixed hyperlipidemia 01/20/2022   Palpitations    Paroxysmal A-fib (HCC) 11/2022   Personal history of radiation therapy     Past Surgical History:  Procedure Laterality Date   ABDOMINAL HYSTERECTOMY     BREAST LUMPECTOMY     BREAST LUMPECTOMY WITH RADIOACTIVE SEED LOCALIZATION Left 01/04/2019   Procedure: LEFT BREAST LUMPECTOMY WITH RADIOACTIVE SEED LOCALIZATION;  Surgeon: Harriette Bouillon, MD;  Location: [facility];  Service: General;  Laterality: Left;   INNER EAR SURGERY     RE-EXCISION OF BREAST LUMPECTOMY Left 01/18/2019   Procedure: RE-EXCISION OF LEFT BREAST LUMPECTOMY;  Surgeon: Harriette Bouillon, MD;  Location: [facility];  Service: General;  Laterality: Left;   ROTATOR CUFF REPAIR Right    WRIST SURGERY      Family History  Problem Relation Age of Onset   Stroke Mother    Kidney failure Father  Valvular heart disease Brother    Emphysema Brother    Cancer Paternal Grandmother        Throat    Social History   Socioeconomic History   Marital status: Married    Spouse name: Gerrie Nordmann   Number of children: Not on file   Years of education: Not on file   Highest education level: Not on file  Occupational History   Not on file  Tobacco Use   Smoking status: Never   Smokeless tobacco: Never  Vaping Use   Vaping status: Never Used  Substance and Sexual Activity   Alcohol use: Yes    Comment: rare   Drug use: No   Sexual activity: Not on file  Other Topics Concern   Not on file  Social History  Narrative   Not on file   Social Drivers of Health   Financial Resource Strain: Low Risk  (02/19/2023)   Overall Financial Resource Strain (CARDIA)    Difficulty of Paying Living Expenses: Not hard at all  Food Insecurity: No Food Insecurity (11/26/2022)   Received from [facility]   Hunger Vital Sign    Worried About Running Out of Food in the Last Year: Never true    Ran Out of Food in the Last Year: Never true  Transportation Needs: No Transportation Needs (11/27/2022)   Received from [facility] - Transportation    Lack of Transportation (Medical): No    Lack of Transportation (Non-Medical): No  Physical Activity: Inactive (02/19/2023)   Exercise Vital Sign    Days of Exercise per Week: 0 days    Minutes of Exercise per Session: 0 min  Stress: No Stress Concern Present (11/26/2022)   Received from [facility] - Occupational Stress Questionnaire    Feeling of Stress : Not at all  Social Connections: Moderately Isolated (02/19/2023)   Social Connection and Isolation Panel [NHANES]    Frequency of Communication with Friends and Family: Three times a week    Frequency of Social Gatherings with Friends and Family: Three times a week    Attends Religious Services: Never    Active Member of Clubs or Organizations: No    Attends Banker Meetings: Never    Marital Status: Married  Catering manager Violence: Not At Risk (11/26/2022)   Received from [facility]   HITS    Over the last 12 months how often did your partner physically hurt you?: Never    Over the last 12 months how often did your partner insult you or talk down to you?: Never    Over the last 12 months how often did your partner threaten you with physical harm?: Never    Over the last 12 months how often did your partner scream or curse at you?: Never    Review of Systems  Constitutional:  Positive for malaise/fatigue. Negative for chills and  fever.  HENT:  Negative for ear pain, sinus pain and sore throat.   Respiratory:  Negative for cough and shortness of breath.   Cardiovascular:  Negative for chest pain.  Gastrointestinal:  Positive for diarrhea.  Genitourinary:  Negative for dysuria, frequency and urgency.  Musculoskeletal:  Negative for myalgias.  Neurological:  Negative for headaches.        Objective    BP 136/70   Pulse 91   Temp 97.8 F (36.6 C)   Ht 5\' 1"  (1.549 m)   Wt  145 lb (65.8 kg)   SpO2 98%   BMI 27.40 kg/m   Physical Exam Constitutional:      General: She is not in acute distress.    Appearance: Normal appearance.  Eyes:     Conjunctiva/sclera: Conjunctivae normal.  Cardiovascular:     Rate and Rhythm: Normal rate and regular rhythm.     Heart sounds: Normal heart sounds.  Pulmonary:     Effort: Pulmonary effort is normal.  Abdominal:     Palpations: Abdomen is soft.     Tenderness: There is no abdominal tenderness.  Musculoskeletal:        General: Normal range of motion.  Skin:    General: Skin is warm.  Neurological:     Mental Status: She is alert. Mental status is at baseline.  Psychiatric:        Mood and Affect: Mood normal.        Behavior: Behavior normal.        Assessment & Plan:   Problem List Items Addressed This Visit       Other   Blood glucose elevated   Relevant Orders   Hemoglobin A1c   Anemia - Primary   Relevant Orders   Iron, TIBC and Ferritin Panel   CBC with Differential   Comprehensive metabolic panel   G25 and Folate Panel   Methylmalonic acid, serum    Follow [id number] in about 3 months (around 05/20/2023) for chronic.   Lajuana Matte, FNP [facility] [phone]

## 2023-02-20 LAB — HEMOGLOBIN A1C
Est. average glucose Bld gHb Est-mCnc: 105 mg/dL
Hgb A1c MFr Bld: 5.3 % (ref 4.8–5.6)

## 2023-02-25 LAB — COMPREHENSIVE METABOLIC PANEL
ALT: 9 [IU]/L (ref 0–32)
AST: 17 [IU]/L (ref 0–40)
Albumin: 4.3 g/dL (ref 3.8–4.8)
Alkaline Phosphatase: 85 [IU]/L (ref 44–121)
BUN/Creatinine Ratio: 27 (ref 12–28)
BUN: 27 mg/dL (ref 8–27)
Bilirubin Total: 0.2 mg/dL (ref 0.0–1.2)
CO2: 21 mmol/L (ref 20–29)
Calcium: 9.6 mg/dL (ref 8.7–10.3)
Chloride: 107 mmol/L — ABNORMAL HIGH (ref 96–106)
Creatinine, Ser: 0.99 mg/dL (ref 0.57–1.00)
Globulin, Total: 2.5 g/dL (ref 1.5–4.5)
Glucose: 97 mg/dL (ref 70–99)
Potassium: 4.7 mmol/L (ref 3.5–5.2)
Sodium: 144 mmol/L (ref 134–144)
Total Protein: 6.8 g/dL (ref 6.0–8.5)
eGFR: 58 mL/min/{1.73_m2} — ABNORMAL LOW (ref >59)

## 2023-02-25 LAB — CBC WITH DIFFERENTIAL/PLATELET
Basophils Absolute: 0.1 10*3/uL (ref 0.0–0.2)
Basos: 1 %
EOS (ABSOLUTE): 0.2 10*3/uL (ref 0.0–0.4)
Eos: 3 %
Hematocrit: 28.6 % — ABNORMAL LOW (ref 34.0–46.6)
Hemoglobin: 8 g/dL — ABNORMAL LOW (ref 11.1–15.9)
Immature Grans (Abs): 0 10*3/uL (ref 0.0–0.1)
Immature Granulocytes: 0 %
Lymphocytes Absolute: 1.6 10*3/uL (ref 0.7–3.1)
Lymphs: 28 %
MCH: 22.1 pg — ABNORMAL LOW (ref 26.6–33.0)
MCHC: 28 g/dL — ABNORMAL LOW (ref 31.5–35.7)
MCV: 79 fL (ref 79–97)
Monocytes Absolute: 0.8 10*3/uL (ref 0.1–0.9)
Monocytes: 14 %
Neutrophils Absolute: 3.1 10*3/uL (ref 1.4–7.0)
Neutrophils: 54 %
Platelets: 383 10*3/uL (ref 150–450)
RBC: 3.62 x10E6/uL — ABNORMAL LOW (ref 3.77–5.28)
RDW: 25.2 % — ABNORMAL HIGH (ref 11.7–15.4)
WBC: 5.8 10*3/uL (ref 3.4–10.8)

## 2023-02-25 LAB — IRON,TIBC AND FERRITIN PANEL
Ferritin: 21 ng/mL (ref 15–150)
Iron Saturation: 88 % (ref 15–55)
Iron: 361 ug/dL (ref 27–139)
Total Iron Binding Capacity: 410 ug/dL (ref 250–450)
UIBC: 49 ug/dL — ABNORMAL LOW (ref 118–369)

## 2023-02-25 LAB — B12 AND FOLATE PANEL
Folate: 10.7 ng/mL (ref >3.0)
Vitamin B-12: >2000 pg/mL — ABNORMAL HIGH (ref 232–1245)

## 2023-02-25 LAB — METHYLMALONIC ACID, SERUM: Methylmalonic Acid: 155 nmol/L (ref 0–378)

## 2023-02-26 ENCOUNTER — Other Ambulatory Visit: Payer: Self-pay

## 2023-02-26 ENCOUNTER — Telehealth: Payer: Self-pay

## 2023-02-26 ENCOUNTER — Telehealth: Payer: Self-pay | Admitting: Family Medicine

## 2023-02-26 DIAGNOSIS — D649 Anemia, unspecified: Secondary | ICD-10-CM

## 2023-02-26 NOTE — Telephone Encounter (Signed)
Patient has been made aware of labs

## 2023-02-26 NOTE — Telephone Encounter (Signed)
Was not able to leave voicemail due to mailbox being full

## 2023-02-26 NOTE — Telephone Encounter (Signed)
Copied from CRM [id number]. Topic: Clinical - Lab/Test Results >> Feb 23, 2023  9:05 AM Herbert Seta B wrote: Reason for CRM: Patient calling to discuss lab results from 02/19/23 has concerns with no energy.

## 2023-03-03 ENCOUNTER — Other Ambulatory Visit: Payer: Self-pay | Admitting: Hematology and Oncology

## 2023-03-03 DIAGNOSIS — D500 Iron deficiency anemia secondary to blood loss (chronic): Secondary | ICD-10-CM

## 2023-03-03 NOTE — Progress Notes (Cosign Needed)
 [facility] [address] [place],  [place]  [address] [phone]  Clinic Day:  03/06/2023   Referring physician: Sirivol, Mamatha, MD  Patient Care Team: Patient Care Team: Teressa Harrie HERO, FNP as PCP - General (Family Medicine) [name], MD as Consulting Physician (Hematology and Oncology) [facility], [name], Jennifer SAUNDERS, MD as Consulting Physician (Cardiology) Vanderbilt Ned, MD as Consulting Physician (Oncology)   REASON FOR CONSULTATION:  Iron deficiency anemia  HISTORY OF PRESENT ILLNESS:  Andrea [name] is a 77 y.o. female with iron deficiency anemia due to recent GI bleed, who is referred in consultation by Dr. Mamatha Sirivol for assessment and management. She was admitted to [facility] in [place] in September after she was found to have a hemoglobin of 5.1 at her cardiologist's office.  Stool was heme positive. Ferritin was 7.1. She received 3 units of PRBC's as an inpatient.  She also had hypertensive urgency and acute kidney injury.  CT abdomen and pelvis did not reveal any acute abnormality. EGD and colonoscopy did not reveal any source of blood loss, although colonoscopy was aborted due to severe sigmoid diverticulosis with muscular  hypertrophy causing luminal narrowing.  CT colonoscopy was recommended by GI.  The patient states she had a previous colonoscopy at [facility].  She denies having any polyps.  I cannot obtain the records as it is so remote.  She denies progressive fatigue concerning for worsening anemia.  In November, Dr. Edwyna increased her metoprolol  to 100 mg in the morning and 50 mg in the evening due to persistent hypertension.  Amlodipine  was discontinued.  Her hemoglobin had dropped to 7, so follow-up with her PCP or GI it was recommended.  She was seen by Harrie Teressa, FNP to establish primary care on December 19, at which time her hemoglobin was 8, ferritin 21, B12 over 2000, MMA normal and  folate 10.7.  She had new onset atrial fibrillation with rapid ventricular response.  She converted with IV metoprolol .  Carvedilol  was discontinued and she was placed on metoprolol  50 mg twice daily and amlodipine  5 mg daily echocardiogram revealed normal left ventricular size and function with an ejection fraction of 55 to 60%.  There was mitral valve prolapse and mild mitral regurgitation as well as mild tricuspid regurgitation and trace pulmonic regurgitation.  There was moderate sclerosis of the aortic valve without regurgitation or stenosis.  There was a small circumferential pericardial effusion.  Follow-up with her cardiologist was recommended to decide on anticoagulation.  Her hemoglobin was 8.9 on discharge.  All aspirin and NSAIDs were stopped during hospitalization.  She was seen at her cardiology office after discharge was felt to have paroxysmal atrial fibrillation, but anticoagulation was not initiated due to the GI bleed.    She reports feeling better since her hemoglobin has improved. She denies pica to ice. She denies melana or hematochezia. She denies other overt form of blood loss. She states no further GI evaluation has been scheduled.  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 This was treated with lumpectomy, reexcision due to positive margin, and adjuvant radiation to the left breast completed in January 2021.  She has followed with Dr. Gudena and Dr. Vanderbilt.  She completed anti-estrogen therapy and Dr. Odean discharged her in May. Additional problems include new onset atrial fibrillation, hypertension, cardiomyopathy, coronary artery disease, hyperlipidemia, GERD. Status post left breast lumpectomy, hysterectomy, right rotator  cuff repair, right wrist surgery. Vertigo.  Social history:  She has never smoked or used smokeless tobacco. She denies alcohol use. She denies other substance use. She is married and lives with  her spouse. She has never had children, but helped raise family and friends children over the years. She worked 40 years for [employer].  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  Family history: Paternal grandmother with throat cancer.  She denies other family history of cancer.  She denies any family history of anemia or other blood dyscrasia.   REVIEW OF SYSTEMS:  Review of Systems  Constitutional:  Positive for fatigue (improved). Negative for appetite change, chills, fever and unexpected weight change.  HENT:   Negative for lump/mass, mouth sores, nosebleeds and sore throat.   Respiratory:  Negative for cough, hemoptysis and shortness of breath.   Cardiovascular:  Negative for chest pain and leg swelling.  Gastrointestinal:  Negative for abdominal pain, blood in stool, constipation, diarrhea, nausea and vomiting.  Genitourinary:  Negative for difficulty urinating, dysuria, frequency, hematuria and vaginal bleeding.   Musculoskeletal:  Negative for arthralgias, back pain, gait problem and myalgias.  Skin:  Negative for rash.  Neurological:  Negative for dizziness, extremity weakness, gait problem, headaches, light-headedness and numbness.  Hematological:  Negative for adenopathy. Does not bruise/bleed easily.  Psychiatric/Behavioral:  Negative for depression and sleep disturbance. The patient is not nervous/anxious.      VITALS:  Blood pressure (!) 140/70, pulse 76, temperature 97.8 F (36.6 C), temperature source Oral, resp. rate 18, height 5' 0.3 (1.532 m), weight 136 lb 9.6 oz (62 kg), SpO2 100%.  Wt Readings from Last 3 Encounters:  03/06/23 136 lb 9.6 oz (62 kg)  02/19/23 145 lb (65.8 kg)  01/15/23 142 lb (64.4 kg)    Body mass index is 26.41 kg/m.  Performance status (ECOG): 1 - Symptomatic but completely ambulatory  PHYSICAL EXAM:  Physical Exam Vitals and nursing note reviewed.   Constitutional:      General: She is not in acute distress.    Appearance: Normal appearance.  HENT:     Head: Normocephalic and atraumatic.     Mouth/Throat:     Mouth: Mucous membranes are moist.     Pharynx: Oropharynx is clear. No oropharyngeal exudate or posterior oropharyngeal erythema.  Eyes:     General: No scleral icterus.    Extraocular Movements: Extraocular movements intact.     Conjunctiva/sclera: Conjunctivae normal.     Pupils: Pupils are equal, round, and reactive to light.  Cardiovascular:     Rate and Rhythm: Normal rate and regular rhythm.     Heart sounds: Normal heart sounds. No murmur heard.    No friction rub. No gallop.  Pulmonary:     Effort: Pulmonary effort is normal.     Breath sounds: Normal breath sounds. No wheezing, rhonchi or rales.  Abdominal:     General: There is no distension.     Palpations: Abdomen is soft. There is no hepatomegaly, splenomegaly or mass.     Tenderness: There is no abdominal tenderness.  Musculoskeletal:        General: Normal range of motion.     Cervical back: Normal range of motion and neck supple. No tenderness.     Right lower leg: No edema.     Left lower leg: No edema.  Lymphadenopathy:     Cervical: No cervical  adenopathy.     Upper Body:     Right upper body: No supraclavicular or axillary adenopathy.     Left upper body: No supraclavicular or axillary adenopathy.     Lower Body: No right inguinal adenopathy. No left inguinal adenopathy.  Skin:    General: Skin is warm and dry.     Coloration: Skin is not jaundiced.     Findings: No rash.  Neurological:     Mental Status: She is alert and oriented to person, place, and time.     Cranial Nerves: No cranial nerve deficit.  Psychiatric:        Mood and Affect: Mood normal.        Behavior: Behavior normal.        Thought Content: Thought content normal.      LABS:      Latest Ref Rng & Units 03/06/2023    1:12 PM 02/19/2023    2:39 PM 01/15/2023     3:35 PM  CBC  WBC 4.0 - 10.5 K/uL 5.7  5.8  5.7   Hemoglobin 12.0 - 15.0 g/dL 9.1  8.0  7.0   Hematocrit 36.0 - 46.0 % 31.5  28.6  25.6   Platelets 150 - 400 K/uL 362  383  386       Latest Ref Rng & Units 03/06/2023    1:12 PM 02/19/2023    2:39 PM 01/15/2023    3:35 PM  CMP  Glucose 70 - 99 mg/dL 896  97  891   BUN 8 - 23 mg/dL 21  27  21    Creatinine 0.44 - 1.00 mg/dL 9.10  9.00  9.18   Sodium 135 - 145 mmol/L 143  144  143   Potassium 3.5 - 5.1 mmol/L 3.9  4.7  4.4   Chloride 98 - 111 mmol/L 105  107  105   CO2 22 - 32 mmol/L 26  21  21    Calcium 8.9 - 10.3 mg/dL 9.7  9.6  9.3   Total Protein 6.5 - 8.1 g/dL 7.0  6.8    Total Bilirubin 0.0 - 1.2 mg/dL 0.3  0.2    Alkaline Phos 38 - 126 U/L 76  85    AST 15 - 41 U/L 22  17    ALT 0 - 44 U/L 7  9      No results found for: TOTALPROTELP, ALBUMINELP, A1GS, A2GS, BETS, BETA2SER, GAMS, MSPIKE, SPEI  Lab Results  Component Value Date   TIBC 410 02/19/2023   FERRITIN 21 02/19/2023   IRONPCTSAT 88 (HH) 02/19/2023   Lab Results  Component Value Date   LDH 179 03/06/2023    STUDIES:  No results found.    HISTORY:   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   Hyperlipemia 12/22/2016   Hyperlipidemia  Hypertension    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   11/2018-High  grade-lumpectomy-radiation 02/2019-03/14/2019   Mixed hyperlipidemia 01/20/2022   Palpitations    Paroxysmal A-fib (HCC) 11/2022   Personal history of radiation therapy     Past Surgical History:  Procedure Laterality Date   ABDOMINAL HYSTERECTOMY     BREAST LUMPECTOMY     BREAST LUMPECTOMY WITH RADIOACTIVE SEED LOCALIZATION Left 01/04/2019   Procedure: LEFT BREAST LUMPECTOMY WITH RADIOACTIVE SEED LOCALIZATION;  Surgeon: Vanderbilt Ned, MD;  Location: [facility];  Service: General;  Laterality: Left;   INNER EAR SURGERY     RE-EXCISION OF BREAST LUMPECTOMY Left 01/18/2019   Procedure: RE-EXCISION OF LEFT BREAST LUMPECTOMY;  Surgeon: Vanderbilt Ned, MD;  Location: [facility];  Service: General;  Laterality: Left;   ROTATOR CUFF REPAIR Right    WRIST SURGERY      Family History  Problem Relation Age of Onset   Stroke Mother    Kidney failure Father    Valvular heart disease Brother    Emphysema Brother    Cancer Paternal Grandmother        Throat    Social History:  reports that she has never smoked. She has never used smokeless tobacco. She reports current alcohol use. She reports that she does not use drugs.The patient is alone today.  Allergies:  Allergies  Allergen Reactions   Cortisone     States allegic to oral Cortisone, but not to shot    Current Medications: Current Outpatient Medications  Medication Sig Dispense Refill   Ascorbic Acid (VITAMIN C) 1000 MG tablet Take 1,000 mg by mouth daily.      CRANBERRY PO Take 8,400 mg by mouth daily.     metoprolol  succinate (TOPROL -XL) 100 MG 24 hr tablet Take 1 tablet (100 mg total) by mouth every morning AND 0.5 tablets (50 mg total) at bedtime. Take with or immediately following a meal.. 135 tablet 3   metoprolol  succinate (TOPROL -XL) 50 MG 24 hr tablet      Multiple Vitamins-Minerals (BONEUP PO) Take 1 tablet by mouth 2 (two) times daily.     Multiple Vitamins-Minerals (OCUVITE PO) Take 1  tablet by mouth daily.      Omega-3 1000 MG CAPS Take 2,000 mg by mouth 2 (two) times daily.     omeprazole (PRILOSEC) 20 MG capsule Take 20 mg by mouth daily.     OVER THE COUNTER MEDICATION Take 2 tablets by mouth 2 (two) times daily. (Patient not taking: Reported on 03/06/2023)     Probiotic CAPS Take 1 capsule by mouth daily.      Specialty Vitamins Products (MENOPAUSE RELIEF PO) Take 731 mg by mouth daily.     vitamin B-12 (CYANOCOBALAMIN ) 1000 MCG tablet Take 1,000 mcg by mouth daily.      zinc  gluconate 50 MG tablet Take 50 mg by mouth daily.      No current facility-administered medications for this visit.     ASSESSMENT & PLAN:   Assessment?Plan:  Francoise Chojnowski is a 77 y.o. female iron deficiency anemia due to GI bleed.  EGD and incomplete colonoscopy did not reveal any site of GI blood loss.  CT colonography was recommended by GI during hospitalization, but has not been done.  I will schedule this.  She has paroxysmal atrial fibrillation, but is not on anticoagulation due to the recent GI bleed and severe anemia.  She is not on any iron supplementation. She is not  keen on IV iron.   Her hemoglobin continues to improve. Once I have the iron studies, I will her with further recommendations.  I discussed the assessment and plan with the patient.  The patient was provided an opportunity to ask questions and all were answered.  The patient agreed with the plan and demonstrated an understanding of the instructions.    Thank you for the referral.    60 minutes was spent in patient care.  This included time spent preparing to see the patient (e.g., review of tests), obtaining and/or reviewing separately obtained history, counseling and educating the patient/family/caregiver, ordering medications, tests, or procedures; documenting clinical information in the electronic or other health record, independently interpreting results and communicating results to the patient/family/caregiver as  well as coordination of care.      Andrez DELENA Foy, PA-C   Physician Assistant [facility] [place] [phone]

## 2023-03-06 ENCOUNTER — Telehealth: Payer: Self-pay | Admitting: Hematology and Oncology

## 2023-03-06 ENCOUNTER — Inpatient Hospital Stay: Payer: Medicare Other

## 2023-03-06 ENCOUNTER — Inpatient Hospital Stay: Payer: Medicare Other | Attending: Hematology and Oncology | Admitting: Hematology and Oncology

## 2023-03-06 ENCOUNTER — Encounter: Payer: Self-pay | Admitting: Hematology and Oncology

## 2023-03-06 VITALS — BP 140/70 | HR 76 | Temp 97.8°F | Resp 18 | Ht 60.3 in | Wt 136.6 lb

## 2023-03-06 DIAGNOSIS — D500 Iron deficiency anemia secondary to blood loss (chronic): Secondary | ICD-10-CM

## 2023-03-06 DIAGNOSIS — Z853 Personal history of malignant neoplasm of breast: Secondary | ICD-10-CM | POA: Diagnosis not present

## 2023-03-06 DIAGNOSIS — I480 Paroxysmal atrial fibrillation: Secondary | ICD-10-CM | POA: Diagnosis not present

## 2023-03-06 DIAGNOSIS — K922 Gastrointestinal hemorrhage, unspecified: Secondary | ICD-10-CM | POA: Insufficient documentation

## 2023-03-06 DIAGNOSIS — K5733 Diverticulitis of large intestine without perforation or abscess with bleeding: Secondary | ICD-10-CM

## 2023-03-06 DIAGNOSIS — Z800 Family history of malignant neoplasm of digestive organs: Secondary | ICD-10-CM

## 2023-03-06 LAB — IRON AND TIBC
Iron: 29 ug/dL (ref 28–170)
Saturation Ratios: 7 % — ABNORMAL LOW (ref 10.4–31.8)
TIBC: 427 ug/dL (ref 250–450)
UIBC: 398 ug/dL

## 2023-03-06 LAB — CBC WITH DIFFERENTIAL (CANCER CENTER ONLY)
Abs Immature Granulocytes: 0.04 10*3/uL (ref 0.00–0.07)
Basophils Absolute: 0 10*3/uL (ref 0.0–0.1)
Basophils Relative: 1 %
Eosinophils Absolute: 0.1 10*3/uL (ref 0.0–0.5)
Eosinophils Relative: 2 %
HCT: 31.5 % — ABNORMAL LOW (ref 36.0–46.0)
Hemoglobin: 9.1 g/dL — ABNORMAL LOW (ref 12.0–15.0)
Immature Granulocytes: 1 %
Lymphocytes Relative: 29 %
Lymphs Abs: 1.6 10*3/uL (ref 0.7–4.0)
MCH: 24 pg — ABNORMAL LOW (ref 26.0–34.0)
MCHC: 28.9 g/dL — ABNORMAL LOW (ref 30.0–36.0)
MCV: 83.1 fL (ref 80.0–100.0)
Monocytes Absolute: 0.7 10*3/uL (ref 0.1–1.0)
Monocytes Relative: 12 %
Neutro Abs: 3.3 10*3/uL (ref 1.7–7.7)
Neutrophils Relative %: 55 %
Platelet Count: 362 10*3/uL (ref 150–400)
RBC: 3.79 MIL/uL — ABNORMAL LOW (ref 3.87–5.11)
RDW: 28.5 % — ABNORMAL HIGH (ref 11.5–15.5)
WBC Count: 5.7 10*3/uL (ref 4.0–10.5)
nRBC: 0 % (ref 0.0–0.2)
nRBC: 0 /100{WBCs}

## 2023-03-06 LAB — CMP (CANCER CENTER ONLY)
ALT: 7 U/L (ref 0–44)
AST: 22 U/L (ref 15–41)
Albumin: 4.2 g/dL (ref 3.5–5.0)
Alkaline Phosphatase: 76 U/L (ref 38–126)
Anion gap: 12 (ref 5–15)
BUN: 21 mg/dL (ref 8–23)
CO2: 26 mmol/L (ref 22–32)
Calcium: 9.7 mg/dL (ref 8.9–10.3)
Chloride: 105 mmol/L (ref 98–111)
Creatinine: 0.89 mg/dL (ref 0.44–1.00)
GFR, Estimated: >60 mL/min (ref >60)
Glucose, Bld: 103 mg/dL — ABNORMAL HIGH (ref 70–99)
Potassium: 3.9 mmol/L (ref 3.5–5.1)
Sodium: 143 mmol/L (ref 135–145)
Total Bilirubin: 0.3 mg/dL (ref 0.0–1.2)
Total Protein: 7 g/dL (ref 6.5–8.1)

## 2023-03-06 LAB — DIRECT ANTIGLOBULIN TEST (NOT AT ARMC)
DAT, IgG: NEGATIVE
DAT, complement: NEGATIVE

## 2023-03-06 LAB — TECHNOLOGIST SMEAR REVIEW: Plt Morphology: NORMAL

## 2023-03-06 LAB — RETICULOCYTES
Immature Retic Fract: 24.5 % — ABNORMAL HIGH (ref 2.3–15.9)
RBC.: 3.77 MIL/uL — ABNORMAL LOW (ref 3.87–5.11)
Retic Count, Absolute: 67.9 10*3/uL (ref 19.0–186.0)
Retic Ct Pct: 1.8 % (ref 0.4–3.1)

## 2023-03-06 LAB — FERRITIN: Ferritin: 22 ng/mL (ref 11–307)

## 2023-03-06 LAB — LACTATE DEHYDROGENASE: LDH: 179 U/L (ref 98–192)

## 2023-03-06 LAB — FOLATE: Folate: 15.1 ng/mL (ref >5.9)

## 2023-03-06 NOTE — Telephone Encounter (Signed)
 03/06/23 Left msg appt for virtual colonscopy appt on 03/31/23@830am .Appt 9am at [facility]

## 2023-03-08 LAB — SOLUBLE TRANSFERRIN RECEPTOR: Transferrin Receptor: 52.1 nmol/L — ABNORMAL HIGH (ref 12.2–27.3)

## 2023-03-08 LAB — HAPTOGLOBIN: Haptoglobin: 204 mg/dL (ref 42–346)

## 2023-03-10 LAB — PROTEIN ELECTROPHORESIS, SERUM, WITH REFLEX
A/G Ratio: 1.2 (ref 0.7–1.7)
Albumin ELP: 3.6 g/dL (ref 2.9–4.4)
Alpha-1-Globulin: 0.3 g/dL (ref 0.0–0.4)
Alpha-2-Globulin: 0.8 g/dL (ref 0.4–1.0)
Beta Globulin: 1.2 g/dL (ref 0.7–1.3)
Gamma Globulin: 0.9 g/dL (ref 0.4–1.8)
Globulin, Total: 3.1 g/dL (ref 2.2–3.9)
Total Protein ELP: 6.7 g/dL (ref 6.0–8.5)

## 2023-03-27 ENCOUNTER — Ambulatory Visit (HOSPITAL_BASED_OUTPATIENT_CLINIC_OR_DEPARTMENT_OTHER)
Admission: EM | Admit: 2023-03-27 | Discharge: 2023-03-27 | Disposition: A | Discharge status: Home / Self Care | Payer: Medicare Other

## 2023-03-27 ENCOUNTER — Encounter (HOSPITAL_COMMUNITY): Payer: Self-pay

## 2023-03-27 ENCOUNTER — Emergency Department (HOSPITAL_COMMUNITY): Payer: Medicare Other

## 2023-03-27 ENCOUNTER — Other Ambulatory Visit: Payer: Self-pay

## 2023-03-27 ENCOUNTER — Encounter (HOSPITAL_BASED_OUTPATIENT_CLINIC_OR_DEPARTMENT_OTHER): Payer: Self-pay | Admitting: Family Medicine

## 2023-03-27 ENCOUNTER — Emergency Department (HOSPITAL_COMMUNITY)
Admission: EM | Admit: 2023-03-27 | Discharge: 2023-03-27 | Disposition: A | Discharge status: Home / Self Care | Payer: Medicare Other | Attending: Emergency Medicine | Admitting: Emergency Medicine

## 2023-03-27 DIAGNOSIS — N179 Acute kidney failure, unspecified: Secondary | ICD-10-CM

## 2023-03-27 DIAGNOSIS — Z20822 Contact with and (suspected) exposure to covid-19: Secondary | ICD-10-CM | POA: Diagnosis not present

## 2023-03-27 DIAGNOSIS — I480 Paroxysmal atrial fibrillation: Secondary | ICD-10-CM | POA: Insufficient documentation

## 2023-03-27 DIAGNOSIS — I129 Hypertensive chronic kidney disease with stage 1 through stage 4 chronic kidney disease, or unspecified chronic kidney disease: Secondary | ICD-10-CM | POA: Insufficient documentation

## 2023-03-27 DIAGNOSIS — K5730 Diverticulosis of large intestine without perforation or abscess without bleeding: Secondary | ICD-10-CM | POA: Diagnosis not present

## 2023-03-27 DIAGNOSIS — D7389 Other diseases of spleen: Secondary | ICD-10-CM | POA: Diagnosis not present

## 2023-03-27 DIAGNOSIS — J101 Influenza due to other identified influenza virus with other respiratory manifestations: Secondary | ICD-10-CM | POA: Insufficient documentation

## 2023-03-27 DIAGNOSIS — N189 Chronic kidney disease, unspecified: Secondary | ICD-10-CM | POA: Diagnosis not present

## 2023-03-27 DIAGNOSIS — K8020 Calculus of gallbladder without cholecystitis without obstruction: Secondary | ICD-10-CM | POA: Insufficient documentation

## 2023-03-27 DIAGNOSIS — E860 Dehydration: Secondary | ICD-10-CM

## 2023-03-27 DIAGNOSIS — Z853 Personal history of malignant neoplasm of breast: Secondary | ICD-10-CM | POA: Diagnosis not present

## 2023-03-27 DIAGNOSIS — R911 Solitary pulmonary nodule: Secondary | ICD-10-CM | POA: Diagnosis not present

## 2023-03-27 DIAGNOSIS — R0789 Other chest pain: Secondary | ICD-10-CM | POA: Diagnosis not present

## 2023-03-27 DIAGNOSIS — D649 Anemia, unspecified: Secondary | ICD-10-CM | POA: Diagnosis not present

## 2023-03-27 DIAGNOSIS — R531 Weakness: Secondary | ICD-10-CM | POA: Diagnosis not present

## 2023-03-27 DIAGNOSIS — J40 Bronchitis, not specified as acute or chronic: Secondary | ICD-10-CM | POA: Insufficient documentation

## 2023-03-27 DIAGNOSIS — R079 Chest pain, unspecified: Secondary | ICD-10-CM | POA: Diagnosis not present

## 2023-03-27 DIAGNOSIS — R059 Cough, unspecified: Secondary | ICD-10-CM | POA: Diagnosis not present

## 2023-03-27 LAB — RESP PANEL BY RT-PCR (RSV, FLU A&B, COVID)  RVPGX2
Influenza A by PCR: POSITIVE — AB
Influenza B by PCR: NEGATIVE
Resp Syncytial Virus by PCR: NEGATIVE
SARS Coronavirus 2 by RT PCR: NEGATIVE

## 2023-03-27 LAB — COMPREHENSIVE METABOLIC PANEL
ALT: 17 U/L (ref 0–44)
AST: 36 U/L (ref 15–41)
Albumin: 3.5 g/dL (ref 3.5–5.0)
Alkaline Phosphatase: 54 U/L (ref 38–126)
Anion gap: 16 — ABNORMAL HIGH (ref 5–15)
BUN: 36 mg/dL — ABNORMAL HIGH (ref 8–23)
CO2: 21 mmol/L — ABNORMAL LOW (ref 22–32)
Calcium: 9.1 mg/dL (ref 8.9–10.3)
Chloride: 101 mmol/L (ref 98–111)
Creatinine, Ser: 1.78 mg/dL — ABNORMAL HIGH (ref 0.44–1.00)
GFR, Estimated: 29 mL/min — ABNORMAL LOW (ref >60)
Glucose, Bld: 123 mg/dL — ABNORMAL HIGH (ref 70–99)
Potassium: 3.5 mmol/L (ref 3.5–5.1)
Sodium: 138 mmol/L (ref 135–145)
Total Bilirubin: 0.6 mg/dL (ref 0.0–1.2)
Total Protein: 7 g/dL (ref 6.5–8.1)

## 2023-03-27 LAB — CBC WITH DIFFERENTIAL/PLATELET
Abs Immature Granulocytes: 0 10*3/uL (ref 0.00–0.07)
Basophils Absolute: 0 10*3/uL (ref 0.0–0.1)
Basophils Relative: 0 %
Eosinophils Absolute: 0 10*3/uL (ref 0.0–0.5)
Eosinophils Relative: 0 %
HCT: 37.4 % (ref 36.0–46.0)
Hemoglobin: 11 g/dL — ABNORMAL LOW (ref 12.0–15.0)
Lymphocytes Relative: 10 %
Lymphs Abs: 0.3 10*3/uL — ABNORMAL LOW (ref 0.7–4.0)
MCH: 26.3 pg (ref 26.0–34.0)
MCHC: 29.4 g/dL — ABNORMAL LOW (ref 30.0–36.0)
MCV: 89.5 fL (ref 80.0–100.0)
Monocytes Absolute: 0.3 10*3/uL (ref 0.1–1.0)
Monocytes Relative: 9 %
Neutro Abs: 2.6 10*3/uL (ref 1.7–7.7)
Neutrophils Relative %: 81 %
Platelets: 233 10*3/uL (ref 150–400)
RBC: 4.18 MIL/uL (ref 3.87–5.11)
RDW: 25.2 % — ABNORMAL HIGH (ref 11.5–15.5)
WBC: 3.2 10*3/uL — ABNORMAL LOW (ref 4.0–10.5)
nRBC: 0 % (ref 0.0–0.2)
nRBC: 0 /100{WBCs}

## 2023-03-27 LAB — TYPE AND SCREEN
ABO/RH(D): O POS
Antibody Screen: NEGATIVE

## 2023-03-27 LAB — LIPASE, BLOOD: Lipase: 156 U/L — ABNORMAL HIGH (ref 11–51)

## 2023-03-27 MED ORDER — SODIUM CHLORIDE 0.9 % IV BOLUS
1000.0000 mL | Freq: Once | INTRAVENOUS | Status: AC
  Administered 2023-03-27: 1000 mL via INTRAVENOUS

## 2023-03-27 MED ORDER — DOXYCYCLINE HYCLATE 100 MG PO CAPS: 100.0000 mg | ORAL_CAPSULE | Freq: Two times a day (BID) | ORAL | 0 refills | Status: DC

## 2023-03-27 NOTE — ED Provider Notes (Signed)
[facility]    CSN: [id number] Arrival date & time: 03/27/23  0957      History   Chief Complaint Chief Complaint  Patient presents with   Cough    HPI Andrea [name] is a 78 y.o. female.   Here with complaint of mild nausea, decreased fluid intake.  Reports diarrhea for 7 days.  Reports significant weakness and could not walk.  Had her husband bring her in with the use of a wheelchair.  Reports that she has had severe blood loss anemia of unknown cause for months.  On 11/25/2022, she was admitted at the hospital in [place] with a hemoglobin of 5.1.  She was there for 2 days and got 2 units of packed cells.  She has seen hematology.  She was told that some of this is iron deficiency.  She is on over-the-counter iron.  She was encouraged to get an iron infusion but did not agree.  She is to see GI next week for further workup of possible GI bleed.   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   Hyperlipemia 12/22/2016   Hyperlipidemia    Hypertension    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   11/2018-High grade-lumpectomy-radiation 02/2019-03/14/2019   Mixed hyperlipidemia 01/20/2022   Palpitations    Paroxysmal  A-fib (HCC) 11/2022   Personal history of radiation therapy     Patient Active Problem List   Diagnosis Date Noted   Blood glucose elevated 02/19/2023   Anemia 02/19/2023   Dyslipidemia 11/26/2022   Paroxysmal A-fib (HCC) 11/2022   Coronary arteriosclerosis 01/20/2022   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   Hearing loss 01/20/2022   Mixed hyperlipidemia 01/20/2022   Breast cancer (HCC)    Cardiomyopathy (HCC)    Complication of anesthesia    GERD (gastroesophageal reflux disease)    Headache    HOH (hard of hearing)    Hyperlipidemia    Hypertension    Palpitations    Personal history of radiation therapy    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   Cancer (HCC) 11/2018   History of cardiomyopathy 10/27/2017   Carotid bruit 10/27/2017   CAD (coronary artery disease), native coronary artery 12/22/2016   Hyperlipemia 12/22/2016   Essential hypertension 12/28/2014    Past Surgical History:  Procedure Laterality Date   ABDOMINAL HYSTERECTOMY     BREAST LUMPECTOMY     BREAST LUMPECTOMY WITH RADIOACTIVE SEED LOCALIZATION Left 01/04/2019   Procedure: LEFT BREAST LUMPECTOMY WITH RADIOACTIVE SEED LOCALIZATION;  Surgeon: Harriette Bouillon, MD;  Location: [facility];  Service: General;  Laterality: Left;   INNER EAR SURGERY  RE-EXCISION OF BREAST LUMPECTOMY Left 01/18/2019   Procedure: RE-EXCISION OF LEFT BREAST LUMPECTOMY;  Surgeon: Harriette Bouillon, MD;  Location: [facility];  Service: General;  Laterality: Left;   ROTATOR CUFF REPAIR Right    WRIST SURGERY      OB History   No obstetric history on file.      Home Medications    Prior to Admission medications   Medication Sig Start Date End Date Taking? Authorizing Provider  metoprolol succinate (TOPROL-XL) 50 MG 24 hr tablet    Yes [provider]  vitamin B-12 (CYANOCOBALAMIN) 1000 MCG tablet Take 1,000 mcg by mouth daily.    Yes [provider]  zinc gluconate 50 MG tablet Take 50 mg by mouth daily.    Yes [provider]  Ascorbic Acid (VITAMIN C) 1000 MG tablet Take 1,000 mg by mouth daily.     [provider]  CRANBERRY PO Take 8,400 mg by mouth daily.    [provider]  metoprolol succinate (TOPROL-XL) 100 MG 24 hr tablet Take 1 tablet (100 mg total) by mouth every morning AND 0.5 tablets (50 mg total) at bedtime. Take with or immediately following a meal.. 01/15/23 04/15/23  Revankar, Aundra Dubin, MD  Multiple Vitamins-Minerals (BONEUP PO) Take 1 tablet by mouth 2 (two) times daily.    [provider]  Multiple Vitamins-Minerals (OCUVITE PO) Take 1 tablet by mouth daily.     [provider]  Omega-3 1000 MG CAPS Take 2,000 mg by mouth 2 (two) times daily.    [provider]  omeprazole (PRILOSEC) 20 MG capsule Take 20 mg by mouth daily.    [provider]  OVER THE COUNTER MEDICATION Take 2 tablets by mouth 2 (two) times daily. Patient not taking: Reported on 03/06/2023    [provider]  Probiotic CAPS Take 1 capsule by mouth daily.     [provider]  Specialty Vitamins Products (MENOPAUSE RELIEF PO) Take 731 mg by mouth daily.    [provider]    Family History Family History  Problem Relation Age of Onset   Stroke Mother    Kidney failure Father    Valvular heart disease Brother    Emphysema Brother    Cancer Paternal Grandmother        Throat    Social History Social History   Tobacco Use   Smoking status: Never   Smokeless tobacco: Never  Vaping Use   Vaping status: Never Used  Substance Use Topics   Alcohol use: Yes    Comment: rare   Drug use: No     Allergies   Cortisone   Review of Systems Review of Systems  Constitutional:  Positive for fatigue. Negative for chills and fever.  HENT:  Negative for congestion, ear pain and sore throat.   Eyes:  Negative for pain and visual disturbance.   Respiratory:  Positive for cough. Negative for shortness of breath.   Cardiovascular:  Negative for chest pain and palpitations.  Gastrointestinal:  Positive for diarrhea and nausea. Negative for abdominal pain, constipation and vomiting.  Genitourinary:  Negative for dysuria and hematuria.  Musculoskeletal:  Negative for arthralgias and back pain.  Skin:  Negative for color change and rash.  Neurological:  Positive for weakness. Negative for seizures and syncope.  All other systems reviewed and are negative.    Physical Exam Triage Vital Signs ED Triage Vitals  Encounter Vitals Group     BP 03/27/23  1047 101/71     Systolic BP Percentile --      Diastolic BP Percentile --      Pulse Rate 03/27/23 1047 92     Resp 03/27/23 1047 18     Temp 03/27/23 1047 (!) 97.3 F (36.3 C)     Temp Source 03/27/23 1047 Oral     SpO2 03/27/23 1047 98 %     Weight --      Height --      Head Circumference --      Peak Flow --      Pain Score 03/27/23 1045 0     Pain Loc --      Pain Education --      Exclude from Growth Chart --    No data found.  Updated Vital Signs BP 101/71 (BP Location: Left Arm)   Pulse 92   Temp (!) 97.3 F (36.3 C) (Oral)   Resp 18   SpO2 98%   Visual Acuity Right Eye Distance:   Left Eye Distance:   Bilateral Distance:    Right Eye Near:   Left Eye Near:    Bilateral Near:     Physical Exam Vitals and nursing note reviewed.  Constitutional:      General: She is not in acute distress.    Appearance: She is well-developed. She is ill-appearing. She is not toxic-appearing.  HENT:     Head: Normocephalic and atraumatic.     Right Ear: Tympanic membrane, ear canal and external ear normal.     Left Ear: Tympanic membrane, ear canal and external ear normal.     Ears:     Comments: Uses bilateral hearing aids    Nose: Nose normal.     Mouth/Throat:     Lips: Pink.     Mouth: Mucous membranes are moist.     Pharynx: Uvula midline. No oropharyngeal  exudate or posterior oropharyngeal erythema.     Tonsils: No tonsillar exudate.  Eyes:     Conjunctiva/sclera: Conjunctivae normal.     Pupils: Pupils are equal, round, and reactive to light.  Cardiovascular:     Rate and Rhythm: Normal rate and regular rhythm.     Heart sounds: S1 normal and S2 normal. No murmur heard. Pulmonary:     Effort: Pulmonary effort is normal. No respiratory distress.     Breath sounds: Normal breath sounds. No decreased breath sounds, wheezing, rhonchi or rales.  Abdominal:     Palpations: Abdomen is soft.     Tenderness: Tenderness: Mild.  Musculoskeletal:        General: No swelling.     Cervical back: Neck supple.  Lymphadenopathy:     Head:     Right side of head: No submental, submandibular, tonsillar, preauricular or posterior auricular adenopathy.     Left side of head: No submental, submandibular, tonsillar, preauricular or posterior auricular adenopathy.     Cervical: No cervical adenopathy.     Right cervical: No superficial cervical adenopathy.    Left cervical: No superficial cervical adenopathy.  Skin:    General: Skin is warm and dry.     Capillary Refill: Capillary refill takes less than 2 seconds.     Coloration: Skin is ashen and pale.     Findings: No rash.  Neurological:     Mental Status: She is alert and oriented to person, place, and time.  Psychiatric:        Mood and Affect: Mood normal.  UC Treatments / Results  Labs (all labs ordered are listed, but only abnormal results are displayed) Labs Reviewed - No data to display  EKG   Radiology No results found.  Procedures Procedures (including critical care time)  Medications Ordered in UC Medications - No data to display  Initial Impression / Assessment and Plan / UC Course  I have reviewed the triage vital signs and the nursing notes.  Pertinent labs & imaging results that were available during my care of the patient were reviewed by me and considered in my  medical decision making (see chart for details).  Given patient's history and pending workup, she is high risk for active blood loss anemia right now and dehydration secondary to nausea and diarrhea for over a week.  She needs to go to an emergency room where she can get blood work and results quickly and get fluids or blood infusion if needed.  Vital signs are stable.  I believe she is safe to transport via her personal vehicle.  Her husband is driving and will take her to the emergency room at [facility], as that is her preference.  Encouraged to follow-up with gastroenterology next week for further testing.  To follow-up with hematology about the anemia.  Follow-up with Dr. Sedalia Muta, PCP.  Follow-up here as needed, if symptoms do not improve, new symptoms occur problem. Final Clinical Impressions(s) / UC Diagnoses   Final diagnoses:  Weakness  Dehydration  Anemia, unspecified type     Discharge Instructions      Encouraged to go to the emergency room for further workup of possible significant anemia and dehydration secondary to nausea and diarrhea.  She is afebrile and her blood pressure and pulse are stable.  She is so weak she does not walk and required a wheelchair to transport from the car.  Follow-up with gastroenterology, hematology and primary care as needed.     ED Prescriptions   None    PDMP not reviewed this encounter.   Prescilla Sours, FNP 03/27/23 1137

## 2023-03-27 NOTE — ED Provider Triage Note (Signed)
Emergency Medicine Provider Triage Evaluation Note  Andrea [name] , a 78 y.o. female  was evaluated in triage.  Pt complains of diarrhea and coughing for a week.  Denies any bloody or black stools besides when she takes Pepto-Bismol.  Denies any fevers.  Patient reports a recent blood transfusion and does follow with heme-onc regarding iron deficiency anemia.  Recently had EGD and colonoscopy which did not reveal any source of blood loss  Review of Systems  Positive: As above Negative: As above  Physical Exam  BP 116/73 (BP Location: Right Arm)   Pulse 93   Temp 98.1 F (36.7 C)   Resp 18   Ht 5\' 2"  (1.575 m)   Wt 61.2 kg   SpO2 100%   BMI 24.69 kg/m  Gen:   Awake, no distress   Resp:  Normal effort  MSK:   Moves extremities without difficulty    Medical Decision Making  Medically screening exam initiated at 1:03 PM.  Appropriate orders placed.  Andrea [name] was informed that the remainder of the evaluation will be completed by another provider, this initial triage assessment does not replace that evaluation, and the importance of remaining in the ED until their evaluation is complete.     Andrea Merles, PA-C 03/27/23 1304

## 2023-03-27 NOTE — ED Triage Notes (Signed)
Pt came to ED for diarrhea and coughing for a week. Denies fevers.  Pt denies blood in stool. Pt had recent blood transfusion.

## 2023-03-27 NOTE — ED Triage Notes (Signed)
Pt c/o coughing, diarrhea, mucous x 1 week.

## 2023-03-27 NOTE — Discharge Instructions (Addendum)
You need a repeat CT chest in 1 year to make sure your lung nodule is not getting bigger.

## 2023-03-27 NOTE — ED Provider Notes (Signed)
[place] EMERGENCY DEPARTMENT AT [facility] Provider Note   CSN: [id number] Arrival date & time: 03/27/23  1243     History  Chief Complaint  Patient presents with   Chest Pain    Andrea [name] is a 78 y.o. female.  Pt is a 78 yo female with pmhx significant for cardiomyopathy, htn, hld, gerd, breast cancer, hoh, cad, paroxysmal afib and gi bleed.  Pt has had cough for about 2 weeks.  She has also had some diarrhea.  Stools have been a little dark, but she takes iron and pepto.  Pt is getting worked up for etiology of anemia.        Home Medications Prior to Admission medications   Medication Sig Start Date End Date Taking? Authorizing Provider  doxycycline (VIBRAMYCIN) 100 MG capsule Take 1 capsule (100 mg total) by mouth 2 (two) times daily. 03/27/23  Yes Jacalyn Lefevre, MD  Ascorbic Acid (VITAMIN C) 1000 MG tablet Take 1,000 mg by mouth daily.     [provider]  CRANBERRY PO Take 8,400 mg by mouth daily.    [provider]  metoprolol succinate (TOPROL-XL) 100 MG 24 hr tablet Take 1 tablet (100 mg total) by mouth every morning AND 0.5 tablets (50 mg total) at bedtime. Take with or immediately following a meal.. 01/15/23 04/15/23  Revankar, Aundra Dubin, MD  metoprolol succinate (TOPROL-XL) 50 MG 24 hr tablet     [provider]  Multiple Vitamins-Minerals (BONEUP PO) Take 1 tablet by mouth 2 (two) times daily.    [provider]  Multiple Vitamins-Minerals (OCUVITE PO) Take 1 tablet by mouth daily.     [provider]  Omega-3 1000 MG CAPS Take 2,000 mg by mouth 2 (two) times daily.    [provider]  omeprazole (PRILOSEC) 20 MG capsule Take 20 mg by mouth daily.    [provider]  OVER THE COUNTER MEDICATION Take 2 tablets by mouth 2 (two) times daily. Patient not taking: Reported on 03/06/2023    [provider]  Probiotic CAPS Take 1 capsule by mouth daily.     [provider]   Specialty Vitamins Products (MENOPAUSE RELIEF PO) Take 731 mg by mouth daily.    [provider]  vitamin B-12 (CYANOCOBALAMIN) 1000 MCG tablet Take 1,000 mcg by mouth daily.     [provider]  zinc gluconate 50 MG tablet Take 50 mg by mouth daily.     [provider]      Allergies    Cortisone    Review of Systems   Review of Systems  Respiratory:  Positive for cough.   Gastrointestinal:  Positive for diarrhea.  All other systems reviewed and are negative.   Physical Exam Updated Vital Signs BP 137/74   Pulse 80   Temp (!) 97.5 F (36.4 C) (Oral)   Resp 20   Ht 5\' 2"  (1.575 m)   Wt 61.2 kg   SpO2 100%   BMI 24.69 kg/m  Physical Exam Vitals and nursing note reviewed.  Constitutional:      Appearance: She is well-developed.  HENT:     Head: Normocephalic and atraumatic.  Eyes:     Extraocular Movements: Extraocular movements intact.     Pupils: Pupils are equal, round, and reactive to light.  Cardiovascular:     Rate and Rhythm: Normal rate and regular rhythm.     Heart sounds: Normal heart sounds.  Pulmonary:  Effort: Pulmonary effort is normal.     Breath sounds: Normal breath sounds.  Abdominal:     General: Bowel sounds are normal.     Palpations: Abdomen is soft.  Musculoskeletal:        General: Normal range of motion.     Cervical back: Normal range of motion and neck supple.  Skin:    General: Skin is warm.     Capillary Refill: Capillary refill takes less than 2 seconds.  Neurological:     General: No focal deficit present.     Mental Status: She is alert and oriented to person, place, and time.  Psychiatric:        Mood and Affect: Mood normal.        Behavior: Behavior normal.     ED Results / Procedures / Treatments   Labs (all labs ordered are listed, but only abnormal results are displayed) Labs Reviewed  RESP PANEL BY RT-PCR (RSV, FLU A&B, COVID)  RVPGX2 - Abnormal; Notable for the following components:       Result Value   Influenza A by PCR POSITIVE (*)    All other components within normal limits  CBC WITH DIFFERENTIAL/PLATELET - Abnormal; Notable for the following components:   WBC 3.2 (*)    Hemoglobin 11.0 (*)    MCHC 29.4 (*)    RDW 25.2 (*)    Lymphs Abs 0.3 (*)    All other components within normal limits  COMPREHENSIVE METABOLIC PANEL - Abnormal; Notable for the following components:   CO2 21 (*)    Glucose, Bld 123 (*)    BUN 36 (*)    Creatinine, Ser 1.78 (*)    GFR, Estimated 29 (*)    Anion gap 16 (*)    All other components within normal limits  LIPASE, BLOOD - Abnormal; Notable for the following components:   Lipase 156 (*)    All other components within normal limits  C DIFFICILE QUICK SCREEN W PCR REFLEX    GASTROINTESTINAL PANEL BY PCR, STOOL (REPLACES STOOL CULTURE)  POC OCCULT BLOOD, ED  TYPE AND SCREEN    EKG EKG Interpretation Date/Time:  Friday March 27 2023 13:06:30 EST Ventricular Rate:  92 PR Interval:  100 QRS Duration:  76 QT Interval:  376 QTC Calculation: 464 R Axis:   51  Text Interpretation: Sinus rhythm with short PR Cannot rule out Anterior infarct , age undetermined Abnormal ECG When compared with ECG of 25-Nov-2022 08:43, PREVIOUS ECG IS PRESENT No significant change since last tracing Confirmed by Jacalyn Lefevre [phone]) on 03/27/2023 9:21:10 PM  Radiology CT CHEST ABDOMEN PELVIS WO CONTRAST Result Date: 03/27/2023 CLINICAL DATA:  Chest and abdominal pain. EXAM: CT CHEST, ABDOMEN AND PELVIS WITHOUT CONTRAST TECHNIQUE: Multidetector CT imaging of the chest, abdomen and pelvis was performed following the standard protocol without IV contrast. RADIATION DOSE REDUCTION: This exam was performed according to the departmental dose-optimization program which includes automated exposure control, adjustment of the mA and/or kV according to patient size and/or use of iterative reconstruction technique. COMPARISON:  Chest radiograph earlier today.   Remote CT 07/12/2007 FINDINGS: CT CHEST FINDINGS Cardiovascular: The heart is normal in size. Trace pericardial effusion. Mitral annulus and coronary artery calcifications. Normal caliber thoracic aorta with mild atherosclerosis. Mediastinum/Nodes: No mediastinal adenopathy. Limited hilar assessment in the absence of IV contrast. Decompressed esophagus. Lungs/Pleura: No focal airspace disease. No pleural effusion. Occasional areas of mucoid impaction involving subsegmental lower lobes. 2 mm right middle lobe nodule  series 5, image 93. Subpleural reticulation in the anterior left upper lobe is typical of post radiation scarring. Musculoskeletal: Postsurgical change in the right proximal humerus. Mild scoliosis and degenerative change in the spine. Surgical clips in the left breast. CT ABDOMEN PELVIS FINDINGS Hepatobiliary: The unenhanced liver is unremarkable. Layering gallstones in the gallbladder. No pericholecystic inflammation. No biliary dilatation. Pancreas: No ductal dilatation or inflammation. Spleen: 15 mm hypodensity in the upper spleen, series 3, image 52. Smaller low-density lesion in the central lower spleen series 3, image 56. Adrenals/Urinary Tract: No adrenal nodule. Bilateral parapelvic cysts. No perinephric inflammation or renal calculi. Decompressed ureters. Unremarkable urinary bladder. Stomach/Bowel: Decompressed stomach. No bowel obstruction or inflammation. Normal appendix. Mild sigmoid colonic diverticulosis without diverticulitis. Vascular/Lymphatic: Aortic atherosclerosis. No aneurysm. No adenopathy. Reproductive: Status post hysterectomy. No adnexal masses. Other: No ascites or free air.  No abdominal wall hernia. Musculoskeletal: Lower lumbar degenerative change. Bilateral hip degenerative change. There are no acute or suspicious osseous abnormalities. IMPRESSION: 1. No acute abnormality in the chest, abdomen, or pelvis. 2. Cholelithiasis without cholecystitis. 3. Mild sigmoid colonic  diverticulosis without diverticulitis. 4. Two low-density lesions in the spleen, measuring up to 15 mm, incompletely characterized without contrast but likely benign. These could be further assessed with nonemergent MRI. 5. A 2 mm right middle lobe nodule. Given history of breast cancer, consider follow-up chest CT in 1 year to establish stability. Aortic Atherosclerosis (ICD10-I70.0). Electronically Signed   By: Narda Rutherford M.D.   On: 03/27/2023 19:57   DG Chest 2 View Result Date: 03/27/2023 CLINICAL DATA:  Cough for 1 week. EXAM: CHEST - 2 VIEW COMPARISON:  10/31/2003. FINDINGS: Bilateral lung fields are clear. Bilateral costophrenic angles are clear. Normal cardio-mediastinal silhouette. No acute osseous abnormalities. The soft tissues are within normal limits. IMPRESSION: No active cardiopulmonary disease. Electronically Signed   By: Jules Schick M.D.   On: 03/27/2023 14:14    Procedures Procedures    Medications Ordered in ED Medications  sodium chloride 0.9 % bolus 1,000 mL (0 mLs Intravenous Stopped 03/27/23 2010)    ED Course/ Medical Decision Making/ A&P                                 Medical Decision Making Amount and/or Complexity of Data Reviewed Radiology: ordered.   This patient presents to the ED for concern of cough/diarrhea, this involves an extensive number of treatment options, and is a complaint that carries with it a high risk of complications and morbidity.  The differential diagnosis includes covid/flu/rsv, pna, gi bleed   Co morbidities that complicate the patient evaluation  cardiomyopathy, htn, hld, gerd, breast cancer, hoh, cad, paroxysmal afib and gi bleed.   Additional history obtained:  Additional history obtained from epic chart review External records from outside source obtained and reviewed including husband/family   Lab Tests:  I Ordered, and personally interpreted labs.  The pertinent results include:  flu a +, covid/rsv neg, cbc  with wbc sl low at 3.2, hgb 11 (hgb 9.1 on 1/3), bmp with bun 36 and cr 1.78 (21 and 0.89 on 1/3)   Imaging Studies ordered:  I ordered imaging studies including ct chest/abd/pelvis  I independently visualized and interpreted imaging which showed  No acute abnormality in the chest, abdomen, or pelvis.  2. Cholelithiasis without cholecystitis.  3. Mild sigmoid colonic diverticulosis without diverticulitis.  4. Two low-density lesions in the spleen, measuring up to 15  mm,  incompletely characterized without contrast but likely benign. These  could be further assessed with nonemergent MRI.  5. A 2 mm right middle lobe nodule. Given history of breast cancer,  consider follow-up chest CT in 1 year to establish stability.    Aortic Atherosclerosis (ICD10-I70.0).   I agree with the radiologist interpretation   Cardiac Monitoring:  The patient was maintained on a cardiac monitor.  I personally viewed and interpreted the cardiac monitored which showed an underlying rhythm of: nsr   Medicines ordered and prescription drug management:  I ordered medication including ivfs  for sx  Reevaluation of the patient after these medicines showed that the patient improved I have reviewed the patients home medicines and have made adjustments as needed   Test Considered:  ct   Critical Interventions:  ivfs   Problem List / ED Course:  Cough:  likely from flu a, but sx have now been going on for 2 weeks, so I have added doxy.   Diarrhea:  likely from the flu. AKI:  likely from the diarrhea.  She is given ivfs.  She is tolerating fluids.   Cholelithiasis:  asx Pulm nodule:  pt told to repeat ct in 1 year to ensure stability.   Reevaluation:  After the interventions noted above, I reevaluated the patient and found that they have :improved   Social Determinants of Health:  Lives at home   Dispostion:  After consideration of the diagnostic results and the patients response to  treatment, I feel that the patent would benefit from discharge with outpatient f/u.          Final Clinical Impression(s) / ED Diagnoses Final diagnoses:  Influenza A  Dehydration  Bronchitis  Calculus of gallbladder without cholecystitis without obstruction  Pulmonary nodule  AKI (acute kidney injury) (HCC)    Rx / DC Orders ED Discharge Orders          Ordered    doxycycline (VIBRAMYCIN) 100 MG capsule  2 times daily        03/27/23 2117              Jacalyn Lefevre, MD 03/27/23 2123

## 2023-03-27 NOTE — Discharge Instructions (Signed)
Encouraged to go to the emergency room for further workup of possible significant anemia and dehydration secondary to nausea and diarrhea.  She is afebrile and her blood pressure and pulse are stable.  She is so weak she does not walk and required a wheelchair to transport from the car.  Follow-up with gastroenterology, hematology and primary care as needed.

## 2023-03-31 ENCOUNTER — Other Ambulatory Visit: Payer: Medicare Other

## 2023-04-03 ENCOUNTER — Ambulatory Visit: Payer: Medicare Other | Admitting: Hematology and Oncology

## 2023-04-03 ENCOUNTER — Other Ambulatory Visit: Payer: Medicare Other

## 2023-04-06 ENCOUNTER — Other Ambulatory Visit: Payer: Self-pay | Admitting: Hematology and Oncology

## 2023-04-06 DIAGNOSIS — D500 Iron deficiency anemia secondary to blood loss (chronic): Secondary | ICD-10-CM

## 2023-04-07 ENCOUNTER — Encounter: Payer: Self-pay | Admitting: Hematology and Oncology

## 2023-04-07 ENCOUNTER — Inpatient Hospital Stay: Payer: Medicare Other | Attending: Hematology and Oncology

## 2023-04-07 ENCOUNTER — Inpatient Hospital Stay (HOSPITAL_BASED_OUTPATIENT_CLINIC_OR_DEPARTMENT_OTHER): Payer: Medicare Other | Admitting: Hematology and Oncology

## 2023-04-07 VITALS — BP 129/69 | HR 100 | Temp 97.9°F | Resp 18 | Ht 62.0 in | Wt 133.0 lb

## 2023-04-07 DIAGNOSIS — K922 Gastrointestinal hemorrhage, unspecified: Secondary | ICD-10-CM | POA: Diagnosis not present

## 2023-04-07 DIAGNOSIS — Z86000 Personal history of in-situ neoplasm of breast: Secondary | ICD-10-CM | POA: Diagnosis not present

## 2023-04-07 DIAGNOSIS — R911 Solitary pulmonary nodule: Secondary | ICD-10-CM | POA: Insufficient documentation

## 2023-04-07 DIAGNOSIS — D500 Iron deficiency anemia secondary to blood loss (chronic): Secondary | ICD-10-CM

## 2023-04-07 DIAGNOSIS — I480 Paroxysmal atrial fibrillation: Secondary | ICD-10-CM | POA: Insufficient documentation

## 2023-04-07 LAB — CBC WITH DIFFERENTIAL (CANCER CENTER ONLY)
Abs Immature Granulocytes: 0.01 10*3/uL (ref 0.00–0.07)
Basophils Absolute: 0 10*3/uL (ref 0.0–0.1)
Basophils Relative: 0 %
Eosinophils Absolute: 0.1 10*3/uL (ref 0.0–0.5)
Eosinophils Relative: 2 %
HCT: 32.9 % — ABNORMAL LOW (ref 36.0–46.0)
Hemoglobin: 10.3 g/dL — ABNORMAL LOW (ref 12.0–15.0)
Immature Granulocytes: 0 %
Lymphocytes Relative: 21 %
Lymphs Abs: 1 10*3/uL (ref 0.7–4.0)
MCH: 27.6 pg (ref 26.0–34.0)
MCHC: 31.3 g/dL (ref 30.0–36.0)
MCV: 88.2 fL (ref 80.0–100.0)
Monocytes Absolute: 0.6 10*3/uL (ref 0.1–1.0)
Monocytes Relative: 13 %
Neutro Abs: 3.2 10*3/uL (ref 1.7–7.7)
Neutrophils Relative %: 64 %
Platelet Count: 319 10*3/uL (ref 150–400)
RBC: 3.73 MIL/uL — ABNORMAL LOW (ref 3.87–5.11)
RDW: 22.7 % — ABNORMAL HIGH (ref 11.5–15.5)
WBC Count: 5 10*3/uL (ref 4.0–10.5)
nRBC: 0 % (ref 0.0–0.2)
nRBC: 0 /100{WBCs}

## 2023-04-07 LAB — IRON AND TIBC
Iron: 116 ug/dL (ref 28–170)
Saturation Ratios: 32 % — ABNORMAL HIGH (ref 10.4–31.8)
TIBC: 365 ug/dL (ref 250–450)
UIBC: 249 ug/dL

## 2023-04-07 LAB — CMP (CANCER CENTER ONLY)
ALT: 7 U/L (ref 0–44)
AST: 26 U/L (ref 15–41)
Albumin: 3.9 g/dL (ref 3.5–5.0)
Alkaline Phosphatase: 83 U/L (ref 38–126)
Anion gap: 12 (ref 5–15)
BUN: 19 mg/dL (ref 8–23)
CO2: 24 mmol/L (ref 22–32)
Calcium: 9.3 mg/dL (ref 8.9–10.3)
Chloride: 108 mmol/L (ref 98–111)
Creatinine: 0.84 mg/dL (ref 0.44–1.00)
GFR, Estimated: >60 mL/min (ref >60)
Glucose, Bld: 122 mg/dL — ABNORMAL HIGH (ref 70–99)
Potassium: 4 mmol/L (ref 3.5–5.1)
Sodium: 143 mmol/L (ref 135–145)
Total Bilirubin: 0.3 mg/dL (ref 0.0–1.2)
Total Protein: 6.7 g/dL (ref 6.5–8.1)

## 2023-04-07 LAB — FERRITIN: Ferritin: 21 ng/mL (ref 11–307)

## 2023-04-07 NOTE — Progress Notes (Cosign Needed)
 [facility]  [address] [place],  [place]  [address] [phone]  Clinic Day:  04/07/2023  Referring physician: Teressa Harrie HERO, FNP   HISTORY OF PRESENT ILLNESS:  The patient is a 78 y.o. female with iron deficiency anemia due to GI bleed.  EGD and incomplete colonoscopy did not reveal any site of GI blood loss.  CT colonography was recommended by GI during hospitalization, but had not been done, so I ordered this.  She is scheduled later this month.  She has paroxysmal atrial fibrillation, but is not on anticoagulation due to the recent GI bleed and severe anemia.  She had persistent iron deficiency with a ferritin of 22, iron saturation 7 and soluble transferrin 52.1.  She desired to hold off on IV iron, so started oral iron.  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 This was treated with lumpectomy, reexcision due to positive margin, and adjuvant radiation to the left breast completed in January 2021.  She has followed with Dr. Gudena and Dr. Vanderbilt.  She completed anti-estrogen therapy and Dr. Gudena discharged her in May   She is here today for routine follow-up and states she is feeling better on oral iron. She is tolerating this without difficulty. She denies pica to ice. She denies any overt form of blood loss.  Since her visit in early January, she was seen in urgent care with cough and diarrhea.  She was ill-appearing and dehydrated, so referred to the emergency department at [facility]. She was found to have influenza A. She underwent CT chest, abdomen and pelvis which did not reveal any acute abnormality in the chest, abdomen, or pelvis. Two low-density lesions in the spleen, measuring up to 15 mm, incompletely characterized without contrast but likely benign. 2 mm right middle lobe nodule. Given history of breast cancer, consider follow-up chest CT in 1 year to establish stability.  PHYSICAL EXAM:  Blood pressure 129/69,  pulse 100, temperature 97.9 F (36.6 C), temperature source Oral, resp. rate 18, height 5' 2 (1.575 m), weight 133 lb (60.3 kg), SpO2 99%. Wt Readings from Last 3 Encounters:  04/07/23 133 lb (60.3 kg)  03/27/23 135 lb (61.2 kg)  03/06/23 136 lb 9.6 oz (62 kg)   Body mass index is 24.33 kg/m.  Performance status (ECOG): 1 - Symptomatic but completely ambulatory  Physical Exam Vitals and nursing note reviewed.  Constitutional:      General: She is not in acute distress.    Appearance: Normal appearance.  HENT:     Head: Normocephalic and atraumatic.     Mouth/Throat:     Mouth: Mucous membranes are moist.     Pharynx: Oropharynx is clear. No oropharyngeal exudate or posterior oropharyngeal erythema.  Eyes:     General: No scleral icterus.    Extraocular Movements: Extraocular movements intact.     Conjunctiva/sclera: Conjunctivae normal.     Pupils: Pupils are equal, round, and reactive to light.  Cardiovascular:     Rate and Rhythm: Normal rate and regular rhythm.     Heart sounds: Normal heart sounds. No murmur heard.    No friction rub. No gallop.  Pulmonary:     Effort: Pulmonary effort is normal.     Breath sounds: Normal breath sounds. No wheezing, rhonchi or rales.  Abdominal:     General: There is no distension.     Palpations: Abdomen is soft. There is no hepatomegaly, splenomegaly  or mass.     Tenderness: There is no abdominal tenderness.  Musculoskeletal:        General: Normal range of motion.     Cervical back: Normal range of motion and neck supple. No tenderness.     Right lower leg: No edema.     Left lower leg: No edema.  Lymphadenopathy:     Cervical: No cervical adenopathy.     Upper Body:     Right upper body: No supraclavicular or axillary adenopathy.     Left upper body: No supraclavicular or axillary adenopathy.     Lower Body: No right inguinal adenopathy. No left inguinal adenopathy.  Skin:    General: Skin is warm and dry.     Coloration:  Skin is not jaundiced.     Findings: No rash.  Neurological:     Mental Status: She is alert and oriented to person, place, and time.     Cranial Nerves: No cranial nerve deficit.  Psychiatric:        Mood and Affect: Mood normal.        Behavior: Behavior normal.        Thought Content: Thought content normal.     LABS:      Latest Ref Rng & Units 04/07/2023   10:06 AM 03/27/2023    1:03 PM 03/06/2023    1:12 PM  CBC  WBC 4.0 - 10.5 K/uL 5.0  3.2  5.7   Hemoglobin 12.0 - 15.0 g/dL 89.6  88.9  9.1   Hematocrit 36.0 - 46.0 % 32.9  37.4  31.5   Platelets 150 - 400 K/uL 319  233  362       Latest Ref Rng & Units 04/07/2023   10:06 AM 03/27/2023    1:03 PM 03/06/2023    1:12 PM  CMP  Glucose 70 - 99 mg/dL 877  876  896   BUN 8 - 23 mg/dL 19  36  21   Creatinine 0.44 - 1.00 mg/dL 9.15  8.21  9.10   Sodium 135 - 145 mmol/L 143  138  143   Potassium 3.5 - 5.1 mmol/L 4.0  3.5  3.9   Chloride 98 - 111 mmol/L 108  101  105   CO2 22 - 32 mmol/L 24  21  26    Calcium 8.9 - 10.3 mg/dL 9.3  9.1  9.7   Total Protein 6.5 - 8.1 g/dL 6.7  7.0  7.0   Total Bilirubin 0.0 - 1.2 mg/dL 0.3  0.6  0.3   Alkaline Phos 38 - 126 U/L 83  54  76   AST 15 - 41 U/L 26  36  22   ALT 0 - 44 U/L 7  17  7      Lab Results  Component Value Date   TOTALPROTELP 6.7 03/06/2023   ALBUMINELP 3.6 03/06/2023   A1GS 0.3 03/06/2023   A2GS 0.8 03/06/2023   BETS 1.2 03/06/2023   GAMS 0.9 03/06/2023   MSPIKE Not Observed 03/06/2023   Lab Results  Component Value Date   TIBC 365 04/07/2023   TIBC 427 03/06/2023   TIBC 410 02/19/2023   FERRITIN 21 04/07/2023   FERRITIN 22 03/06/2023   FERRITIN 21 02/19/2023   IRONPCTSAT 32 (H) 04/07/2023   IRONPCTSAT 7 (L) 03/06/2023   IRONPCTSAT 88 (HH) 02/19/2023   Lab Results  Component Value Date   LDH 179 03/06/2023       Component Value Date/Time  TOTALPROTELP 6.7 03/06/2023 1312   ALBUMINELP 3.6 03/06/2023 1312   A1GS 0.3 03/06/2023 1312   A2GS 0.8 03/06/2023  1312   BETS 1.2 03/06/2023 1312   GAMS 0.9 03/06/2023 1312   MSPIKE Not Observed 03/06/2023 1312   LDH 179 03/06/2023 1312    Review Flowsheet  More data may exist      Latest Ref Rng & Units 02/19/2023 03/06/2023 04/07/2023  Oncology Labs  Ferritin 11 - 307 ng/mL 21  22  21    %SAT 10.4 - 31.8 % 88  7  32   Total Protein ELP 6.0 - 8.5 g/dL - 6.7  -  Albumin ELP 2.9 - 4.4 g/dL - 3.6  -  Alpha-1 Globulin 0.0 - 0.4 g/dL - 0.3  -  Alpha-2 Globulin 0.4 - 1.0 g/dL - 0.8  -  Beta Globulin 0.7 - 1.3 g/dL - 1.2  -  Gamma Globulin 0.4 - 1.8 g/dL - 0.9  -  M-Spike, % Not Observed g/dL - Not Observed  -  LDH 98 - 192 U/L - 179  -     STUDIES:  CT CHEST ABDOMEN PELVIS WO CONTRAST Result Date: 03/27/2023 CLINICAL DATA:  Chest and abdominal pain. EXAM: CT CHEST, ABDOMEN AND PELVIS WITHOUT CONTRAST TECHNIQUE: Multidetector CT imaging of the chest, abdomen and pelvis was performed following the standard protocol without IV contrast. RADIATION DOSE REDUCTION: This exam was performed according to the departmental dose-optimization program which includes automated exposure control, adjustment of the mA and/or kV according to patient size and/or use of iterative reconstruction technique. COMPARISON:  Chest radiograph earlier today.  Remote CT 07/12/2007 FINDINGS: CT CHEST FINDINGS Cardiovascular: The heart is normal in size. Trace pericardial effusion. Mitral annulus and coronary artery calcifications. Normal caliber thoracic aorta with mild atherosclerosis. Mediastinum/Nodes: No mediastinal adenopathy. Limited hilar assessment in the absence of IV contrast. Decompressed esophagus. Lungs/Pleura: No focal airspace disease. No pleural effusion. Occasional areas of mucoid impaction involving subsegmental lower lobes. 2 mm right middle lobe nodule series 5, image 93. Subpleural reticulation in the anterior left upper lobe is typical of post radiation scarring. Musculoskeletal: Postsurgical change in the right proximal  humerus. Mild scoliosis and degenerative change in the spine. Surgical clips in the left breast. CT ABDOMEN PELVIS FINDINGS Hepatobiliary: The unenhanced liver is unremarkable. Layering gallstones in the gallbladder. No pericholecystic inflammation. No biliary dilatation. Pancreas: No ductal dilatation or inflammation. Spleen: 15 mm hypodensity in the upper spleen, series 3, image 52. Smaller low-density lesion in the central lower spleen series 3, image 56. Adrenals/Urinary Tract: No adrenal nodule. Bilateral parapelvic cysts. No perinephric inflammation or renal calculi. Decompressed ureters. Unremarkable urinary bladder. Stomach/Bowel: Decompressed stomach. No bowel obstruction or inflammation. Normal appendix. Mild sigmoid colonic diverticulosis without diverticulitis. Vascular/Lymphatic: Aortic atherosclerosis. No aneurysm. No adenopathy. Reproductive: Status post hysterectomy. No adnexal masses. Other: No ascites or free air.  No abdominal wall hernia. Musculoskeletal: Lower lumbar degenerative change. Bilateral hip degenerative change. There are no acute or suspicious osseous abnormalities. IMPRESSION: 1. No acute abnormality in the chest, abdomen, or pelvis. 2. Cholelithiasis without cholecystitis. 3. Mild sigmoid colonic diverticulosis without diverticulitis. 4. Two low-density lesions in the spleen, measuring up to 15 mm, incompletely characterized without contrast but likely benign. These could be further assessed with nonemergent MRI. 5. A 2 mm right middle lobe nodule. Given history of breast cancer, consider follow-up chest CT in 1 year to establish stability. Aortic Atherosclerosis (ICD10-I70.0). Electronically Signed   By: Andrea Gasman M.D.   On:  03/27/2023 19:57   DG Chest 2 View Result Date: 03/27/2023 CLINICAL DATA:  Cough for 1 week. EXAM: CHEST - 2 VIEW COMPARISON:  10/31/2003. FINDINGS: Bilateral lung fields are clear. Bilateral costophrenic angles are clear. Normal cardio-mediastinal  silhouette. No acute osseous abnormalities. The soft tissues are within normal limits. IMPRESSION: No active cardiopulmonary disease. Electronically Signed   By: Ree Molt M.D.   On: 03/27/2023 14:14      ASSESSMENT & PLAN:   Assessment/Plan:  78 y.o. female with iron deficiency anemia. She preferred to take oral iron instead of IV iron. Her hemoglobin has improved, but her iron stores remain low. Will continue to monitor her and see her back in 3 months for repeat clinical assessment. 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 We can consider repeat imaging in 1 year. The patient understands all the plans discussed today and is in agreement with them.  She knows to contact our office if she develops concerns prior to her next appointment.     Andrea DELENA Foy, PA-C   Physician Assistant [facility] [phone]

## 2023-04-08 ENCOUNTER — Telehealth: Payer: Self-pay

## 2023-04-08 NOTE — Telephone Encounter (Signed)
 Patient notified and voiced understanding.

## 2023-04-08 NOTE — Telephone Encounter (Signed)
-----   Message from Andrea [name] sent at 04/07/2023  4:28 PM EST ----- Please let her know her iron stores are still low, she needs to continue oral iron. If she starts feeling more tired, please call us  and come in sooner. Thanks

## 2023-04-16 ENCOUNTER — Ambulatory Visit: Payer: Medicare Other | Admitting: Cardiology

## 2023-04-28 ENCOUNTER — Other Ambulatory Visit: Payer: Medicare Other

## 2023-06-05 ENCOUNTER — Encounter: Payer: Self-pay | Admitting: Hematology and Oncology

## 2023-06-05 ENCOUNTER — Inpatient Hospital Stay: Payer: Medicare Other | Attending: Hematology and Oncology | Admitting: Hematology and Oncology

## 2023-06-05 ENCOUNTER — Inpatient Hospital Stay: Payer: Medicare Other | Attending: Hematology and Oncology

## 2023-06-05 ENCOUNTER — Other Ambulatory Visit: Payer: Self-pay | Admitting: Cardiology

## 2023-06-05 VITALS — BP 133/71 | HR 80 | Temp 98.7°F | Resp 16 | Ht 62.0 in | Wt 135.7 lb

## 2023-06-05 DIAGNOSIS — D500 Iron deficiency anemia secondary to blood loss (chronic): Secondary | ICD-10-CM

## 2023-06-05 DIAGNOSIS — D509 Iron deficiency anemia, unspecified: Secondary | ICD-10-CM | POA: Diagnosis not present

## 2023-06-05 DIAGNOSIS — D649 Anemia, unspecified: Secondary | ICD-10-CM

## 2023-06-05 DIAGNOSIS — K922 Gastrointestinal hemorrhage, unspecified: Secondary | ICD-10-CM | POA: Diagnosis not present

## 2023-06-05 LAB — IRON AND TIBC
Iron: 24 ug/dL — ABNORMAL LOW (ref 28–170)
Saturation Ratios: 6 % — ABNORMAL LOW (ref 10.4–31.8)
TIBC: 409 ug/dL (ref 250–450)
UIBC: 385 ug/dL

## 2023-06-05 LAB — CBC WITH DIFFERENTIAL (CANCER CENTER ONLY)
Abs Immature Granulocytes: 0.01 10*3/uL (ref 0.00–0.07)
Basophils Absolute: 0.1 10*3/uL (ref 0.0–0.1)
Basophils Relative: 1 %
Eosinophils Absolute: 0.2 10*3/uL (ref 0.0–0.5)
Eosinophils Relative: 3 %
HCT: 30.2 % — ABNORMAL LOW (ref 36.0–46.0)
Hemoglobin: 9.1 g/dL — ABNORMAL LOW (ref 12.0–15.0)
Immature Granulocytes: 0 %
Lymphocytes Relative: 23 %
Lymphs Abs: 1.3 10*3/uL (ref 0.7–4.0)
MCH: 29.4 pg (ref 26.0–34.0)
MCHC: 30.1 g/dL (ref 30.0–36.0)
MCV: 97.4 fL (ref 80.0–100.0)
Monocytes Absolute: 0.7 10*3/uL (ref 0.1–1.0)
Monocytes Relative: 12 %
Neutro Abs: 3.6 10*3/uL (ref 1.7–7.7)
Neutrophils Relative %: 61 %
Platelet Count: 312 10*3/uL (ref 150–400)
RBC: 3.1 MIL/uL — ABNORMAL LOW (ref 3.87–5.11)
RDW: 14.7 % (ref 11.5–15.5)
WBC Count: 5.8 10*3/uL (ref 4.0–10.5)
nRBC: 0 % (ref 0.0–0.2)
nRBC: 0 /100{WBCs}

## 2023-06-05 LAB — FERRITIN: Ferritin: 15 ng/mL (ref 11–307)

## 2023-06-05 LAB — VITAMIN B12: Vitamin B-12: 3014 pg/mL — ABNORMAL HIGH (ref 180–914)

## 2023-06-05 LAB — FOLATE: Folate: 10.2 ng/mL (ref >5.9)

## 2023-06-05 NOTE — Progress Notes (Unsigned)
 [facility]  [address] [place],  [place]  [address] [phone]  Clinic Day:  06/05/2023  Referring physician: Renne Crigler, FNP   HISTORY OF PRESENT ILLNESS:  The patient is a 78 y.o. female with iron deficiency anemia due to GI bleed.  She was hospitalized in September 2024 when her hemoglobin dropped to 5.1.  EGD and incomplete colonoscopy did not reveal any site of GI blood loss.  CT colonography was recommended by GI during hospitalization, but had not been done, so I ordered this.  She was scheduled for this in February, but she cancelled her appointment.  She has paroxysmal atrial fibrillation, but is not on anticoagulation due to the recent GI bleed and severe anemia.  She had persistent iron deficiency at her consultation in January, but she desired to hold off on IV iron, so started oral iron.  Further evaluation for causes of anemia did not reveal another specific etiology.  Her hemoglobin remained low at 10.3 in February.    She is here for repeat clinical assessment and states she is doing well.  She denies progressive fatigue concerning for worsening anemia.  She denies any overt form of blood loss.  She has not started any new medications.  She denies any steroid or nonsteroidal anti-inflammatory use.  PHYSICAL EXAM:  Blood pressure 133/71, pulse 80, temperature 98.7 F (37.1 C), temperature source Oral, resp. rate 16, height 5\' 2"  (1.575 m), weight 135 lb 11.2 oz (61.6 kg), SpO2 100%. Wt Readings from Last 3 Encounters:  06/05/23 135 lb 11.2 oz (61.6 kg)  04/07/23 133 lb (60.3 kg)  03/27/23 135 lb (61.2 kg)   Body mass index is 24.82 kg/m.  Performance status (ECOG): 0 - Asymptomatic  Physical Exam Vitals and nursing note reviewed.  Constitutional:      General: She is not in acute distress.    Appearance: Normal appearance.  HENT:     Head: Normocephalic and atraumatic.     Mouth/Throat:     Mouth: Mucous membranes are moist.      Pharynx: Oropharynx is clear. No oropharyngeal exudate or posterior oropharyngeal erythema.  Eyes:     General: No scleral icterus.    Extraocular Movements: Extraocular movements intact.     Conjunctiva/sclera: Conjunctivae normal.     Pupils: Pupils are equal, round, and reactive to light.  Cardiovascular:     Rate and Rhythm: Normal rate and regular rhythm.     Heart sounds: Normal heart sounds. No murmur heard.    No friction rub. No gallop.  Pulmonary:     Effort: Pulmonary effort is normal.     Breath sounds: Normal breath sounds. No wheezing, rhonchi or rales.  Abdominal:     General: There is no distension.     Palpations: Abdomen is soft. There is no hepatomegaly, splenomegaly or mass.     Tenderness: There is no abdominal tenderness.  Musculoskeletal:        General: Normal range of motion.     Cervical back: Normal range of motion and neck supple. No tenderness.     Right lower leg: No edema.     Left lower leg: No edema.  Lymphadenopathy:     Cervical: No cervical adenopathy.     Upper Body:     Right upper body: No supraclavicular or axillary adenopathy.     Left upper body: No supraclavicular or axillary adenopathy.     Lower Body: No right inguinal adenopathy. No left  inguinal adenopathy.  Skin:    General: Skin is warm and dry.     Coloration: Skin is not jaundiced.     Findings: No rash.  Neurological:     Mental Status: She is alert and oriented to person, place, and time.     Cranial Nerves: No cranial nerve deficit.  Psychiatric:        Mood and Affect: Mood normal.        Behavior: Behavior normal.        Thought Content: Thought content normal.     LABS:      Latest Ref Rng & Units 06/05/2023   10:31 AM 04/07/2023   10:06 AM 03/27/2023    1:03 PM  CBC  WBC 4.0 - 10.5 K/uL 5.8  5.0  3.2   Hemoglobin 12.0 - 15.0 g/dL 9.1  95.6  21.3   Hematocrit 36.0 - 46.0 % 30.2  32.9  37.4   Platelets 150 - 400 K/uL 312  319  233       Latest Ref Rng & Units  04/07/2023   10:06 AM 03/27/2023    1:03 PM 03/06/2023    1:12 PM  CMP  Glucose 70 - 99 mg/dL 086  578  469   BUN 8 - 23 mg/dL 19  36  21   Creatinine 0.44 - 1.00 mg/dL 6.29  5.28  4.13   Sodium 135 - 145 mmol/L 143  138  143   Potassium 3.5 - 5.1 mmol/L 4.0  3.5  3.9   Chloride 98 - 111 mmol/L 108  101  105   CO2 22 - 32 mmol/L 24  21  26    Calcium 8.9 - 10.3 mg/dL 9.3  9.1  9.7   Total Protein 6.5 - 8.1 g/dL 6.7  7.0  7.0   Total Bilirubin 0.0 - 1.2 mg/dL 0.3  0.6  0.3   Alkaline Phos 38 - 126 U/L 83  54  76   AST 15 - 41 U/L 26  36  22   ALT 0 - 44 U/L 7  17  7      Lab Results  Component Value Date   TOTALPROTELP 6.7 03/06/2023   ALBUMINELP 3.6 03/06/2023   A1GS 0.3 03/06/2023   A2GS 0.8 03/06/2023   BETS 1.2 03/06/2023   GAMS 0.9 03/06/2023   MSPIKE Not Observed 03/06/2023   Lab Results  Component Value Date   TIBC 409 06/05/2023   TIBC 365 04/07/2023   TIBC 427 03/06/2023   FERRITIN 15 06/05/2023   FERRITIN 21 04/07/2023   FERRITIN 22 03/06/2023   IRONPCTSAT 6 (L) 06/05/2023   IRONPCTSAT 32 (H) 04/07/2023   IRONPCTSAT 7 (L) 03/06/2023   Lab Results  Component Value Date   LDH 179 03/06/2023       Component Value Date/Time   TOTALPROTELP 6.7 03/06/2023 1312   ALBUMINELP 3.6 03/06/2023 1312   A1GS 0.3 03/06/2023 1312   A2GS 0.8 03/06/2023 1312   BETS 1.2 03/06/2023 1312   GAMS 0.9 03/06/2023 1312   MSPIKE Not Observed 03/06/2023 1312   LDH 179 03/06/2023 1312    Review Flowsheet  More data exists      Latest Ref Rng & Units 03/06/2023 04/07/2023 06/05/2023  Oncology Labs  Ferritin 11 - 307 ng/mL 22  21  15    %SAT 10.4 - 31.8 % 7  32  6   Total Protein ELP 6.0 - 8.5 g/dL 6.7  - -  Albumin ELP 2.9 -  4.4 g/dL 3.6  - -  Alpha-1 Globulin 0.0 - 0.4 g/dL 0.3  - -  Alpha-2 Globulin 0.4 - 1.0 g/dL 0.8  - -  Beta Globulin 0.7 - 1.3 g/dL 1.2  - -  Gamma Globulin 0.4 - 1.8 g/dL 0.9  - -  M-Spike, % Not Observed g/dL Not Observed  - -  LDH 98 - 192 U/L 179  - -      STUDIES:  No results found.    ASSESSMENT & PLAN:   Assessment/Plan:  78 y.o. female with iron deficiency anemia.  She has worsening anemia despite oral iron supplementation without obvious blood loss.  I will evaluate further.  I would recommend she have CT colonography.  The patient understands all the plans discussed today and is in agreement with them.  She knows to contact our office if she develops concerns prior to her next appointment.     Adah Perl, PA-C   Physician Assistant [facility] [place] [phone]

## 2023-06-07 LAB — SOLUBLE TRANSFERRIN RECEPTOR: Transferrin Receptor: 37.5 nmol/L — ABNORMAL HIGH (ref 12.2–27.3)

## 2023-06-10 LAB — COPPER, SERUM: Copper: 119 ug/dL (ref 80–158)

## 2023-06-10 LAB — ZINC: Zinc: 109 ug/dL (ref 44–115)

## 2023-06-11 ENCOUNTER — Encounter: Payer: Self-pay | Admitting: Hematology and Oncology

## 2023-06-11 DIAGNOSIS — D509 Iron deficiency anemia, unspecified: Secondary | ICD-10-CM | POA: Insufficient documentation

## 2023-06-12 ENCOUNTER — Telehealth: Payer: Self-pay | Admitting: Hematology and Oncology

## 2023-06-12 ENCOUNTER — Telehealth: Payer: Self-pay

## 2023-06-12 NOTE — Telephone Encounter (Signed)
 Contacted pt to schedule an appt. Unable to reach via phone, voicemail was left.    Scheduling Message Entered by Belva Crome A on 06/11/2023 at 12:47 PM Priority: High <No visit type provided>  Department: [facility]  Provider:  Scheduling Notes:  Please schedule for Feraheme x 2 next available and change f/u from May to June. Please call house phone to reach her. Thanks    She did not go for virtual colonoscopy, so I reordered. She may ask about that.

## 2023-06-12 NOTE — Telephone Encounter (Signed)
 06/12/23 Spoke with patient and scheduled IRON.

## 2023-06-12 NOTE — Telephone Encounter (Signed)
 Pt notified that [facility] recommends she have some IV iron (schedulers will call her to set up appt's). She also recommends that pt have an CT colonography (virtual colonoscopy), as her colonoscopy in hospital was incomplete. Kelli,PA, will order so schedulers can get appt. Pt verbalized understanding. She asked me twice about keeping her 07/03/2023 appt- which I confirmed yes she does.

## 2023-06-16 ENCOUNTER — Ambulatory Visit

## 2023-06-18 ENCOUNTER — Inpatient Hospital Stay

## 2023-06-18 VITALS — BP 117/66 | HR 88 | Temp 97.7°F | Resp 18

## 2023-06-18 DIAGNOSIS — D500 Iron deficiency anemia secondary to blood loss (chronic): Secondary | ICD-10-CM | POA: Diagnosis not present

## 2023-06-18 DIAGNOSIS — D509 Iron deficiency anemia, unspecified: Secondary | ICD-10-CM

## 2023-06-18 DIAGNOSIS — K922 Gastrointestinal hemorrhage, unspecified: Secondary | ICD-10-CM | POA: Diagnosis not present

## 2023-06-18 MED ORDER — ACETAMINOPHEN 325 MG PO TABS
650.0000 mg | ORAL_TABLET | Freq: Once | ORAL | Status: AC
  Administered 2023-06-18: 650 mg via ORAL
  Filled 2023-06-18: qty 2

## 2023-06-18 MED ORDER — SODIUM CHLORIDE 0.9 % IV SOLN
510.0000 mg | Freq: Once | INTRAVENOUS | Status: AC
  Administered 2023-06-18: 510 mg via INTRAVENOUS
  Filled 2023-06-18: qty 510

## 2023-06-18 MED ORDER — SODIUM CHLORIDE 0.9 % IV SOLN: INTRAVENOUS | Status: DC

## 2023-06-18 MED ORDER — LORATADINE 10 MG PO TABS
10.0000 mg | ORAL_TABLET | Freq: Once | ORAL | Status: AC
  Administered 2023-06-18: 10 mg via ORAL
  Filled 2023-06-18: qty 1

## 2023-06-18 NOTE — Patient Instructions (Signed)

## 2023-06-25 ENCOUNTER — Inpatient Hospital Stay

## 2023-06-25 VITALS — BP 131/78 | HR 96 | Temp 98.0°F | Resp 18

## 2023-06-25 DIAGNOSIS — K922 Gastrointestinal hemorrhage, unspecified: Secondary | ICD-10-CM | POA: Diagnosis not present

## 2023-06-25 DIAGNOSIS — D509 Iron deficiency anemia, unspecified: Secondary | ICD-10-CM

## 2023-06-25 DIAGNOSIS — D500 Iron deficiency anemia secondary to blood loss (chronic): Secondary | ICD-10-CM | POA: Diagnosis not present

## 2023-06-25 MED ORDER — SODIUM CHLORIDE 0.9 % IV SOLN: INTRAVENOUS | Status: DC

## 2023-06-25 MED ORDER — LORATADINE 10 MG PO TABS
10.0000 mg | ORAL_TABLET | Freq: Once | ORAL | Status: AC
  Administered 2023-06-25: 10 mg via ORAL
  Filled 2023-06-25: qty 1

## 2023-06-25 MED ORDER — ACETAMINOPHEN 325 MG PO TABS
650.0000 mg | ORAL_TABLET | Freq: Once | ORAL | Status: AC
  Administered 2023-06-25: 650 mg via ORAL
  Filled 2023-06-25: qty 2

## 2023-06-25 MED ORDER — SODIUM CHLORIDE 0.9 % IV SOLN
510.0000 mg | Freq: Once | INTRAVENOUS | Status: AC
  Administered 2023-06-25: 510 mg via INTRAVENOUS
  Filled 2023-06-25: qty 510

## 2023-06-25 NOTE — Patient Instructions (Signed)

## 2023-07-03 ENCOUNTER — Ambulatory Visit: Admitting: Hematology and Oncology

## 2023-07-03 ENCOUNTER — Other Ambulatory Visit

## 2023-07-29 ENCOUNTER — Other Ambulatory Visit: Payer: Self-pay | Admitting: Hematology and Oncology

## 2023-07-29 DIAGNOSIS — D500 Iron deficiency anemia secondary to blood loss (chronic): Secondary | ICD-10-CM

## 2023-07-29 NOTE — Progress Notes (Unsigned)
 [facility]  [address] [place],  [place]  [address] [phone]  Clinic Day:  08/04/2023  Referring physician: Janece Means, FNP   HISTORY OF PRESENT ILLNESS:  The patient is a 78 y.o. female with iron deficiency anemia due to GI bleed.  She was hospitalized in September 2024 when her hemoglobin dropped to 5.1.  EGD and incomplete colonoscopy did not reveal any site of GI blood loss.  CT colonography was recommended by GI during hospitalization, but had not been done, so I ordered this.  She was scheduled for this in February, but she cancelled her appointment, stating she thought she was supposed to have an upper endoscopy even though she did prep for colonography.  She has paroxysmal atrial fibrillation, but is not on anticoagulation due to the recent GI bleed and severe anemia.  She had persistent iron deficiency at her consultation in January, but she desired to hold off on IV iron, so started oral iron.  Further evaluation for causes of anemia did not reveal another specific etiology.  Her hemoglobin remained low at 10.3 in February.  She was given IV iron in the form of Feraheme in April after her hemoglobin dropped to 9.1.  She is here for repeat clinical assessment.  She denies progressive fatigue concerning for worsening anemia.  She continues iron supplement daily.  She states her stools are dark with the iron.  She otherwise denies any overt form of blood loss.  She denies any steroid or nonsteroidal anti-inflammatory use.  She still has not had CT colonography are recommended, but does not know why.   PHYSICAL EXAM:  Blood pressure 117/71, pulse 82, temperature 98.6 F (37 C), temperature source Oral, resp. rate 18, height 5\' 2"  (1.575 m), weight 132 lb (59.9 kg), SpO2 100%. Wt Readings from Last 3 Encounters:  08/04/23 132 lb (59.9 kg)  06/05/23 135 lb 11.2 oz (61.6 kg)  04/07/23 133 lb (60.3 kg)   Body mass index is 24.14 kg/m.  Performance status  (ECOG): 0 - Asymptomatic  Physical Exam Vitals and nursing note reviewed.  Constitutional:      General: She is not in acute distress.    Appearance: Normal appearance.  HENT:     Head: Normocephalic and atraumatic.     Mouth/Throat:     Mouth: Mucous membranes are moist.     Pharynx: Oropharynx is clear. No oropharyngeal exudate or posterior oropharyngeal erythema.  Eyes:     General: No scleral icterus.    Extraocular Movements: Extraocular movements intact.     Conjunctiva/sclera: Conjunctivae normal.     Pupils: Pupils are equal, round, and reactive to light.  Cardiovascular:     Rate and Rhythm: Normal rate and regular rhythm.     Heart sounds: Normal heart sounds. No murmur heard.    No friction rub. No gallop.  Pulmonary:     Effort: Pulmonary effort is normal.     Breath sounds: Normal breath sounds. No wheezing, rhonchi or rales.  Abdominal:     General: There is no distension.     Palpations: Abdomen is soft. There is no hepatomegaly, splenomegaly or mass.     Tenderness: There is no abdominal tenderness.  Musculoskeletal:        General: Normal range of motion.     Cervical back: Normal range of motion and neck supple. No tenderness.     Right lower leg: No edema.     Left lower leg: No edema.  Lymphadenopathy:     Cervical: No cervical adenopathy.     Upper Body:     Right upper body: No supraclavicular or axillary adenopathy.     Left upper body: No supraclavicular or axillary adenopathy.     Lower Body: No right inguinal adenopathy. No left inguinal adenopathy.  Skin:    General: Skin is warm and dry.     Coloration: Skin is not jaundiced.     Findings: No rash.  Neurological:     Mental Status: She is alert and oriented to person, place, and time.     Cranial Nerves: No cranial nerve deficit.  Psychiatric:        Mood and Affect: Mood normal.        Behavior: Behavior normal.        Thought Content: Thought content normal.     LABS:      Latest  Ref Rng & Units 08/04/2023   10:48 AM 06/05/2023   10:31 AM 04/07/2023   10:06 AM  CBC  WBC 4.0 - 10.5 K/uL 6.0  5.8  5.0   Hemoglobin 12.0 - 15.0 g/dL 9.0  9.1  16.1   Hematocrit 36.0 - 46.0 % 28.5  30.2  32.9   Platelets 150 - 400 K/uL 313  312  319     Latest Reference Range & Units 08/04/23 10:48  Neutrophils % 64  Lymphocytes % 22  Monocytes Relative % 10  Eosinophil % 3  Basophil % 1  Immature Granulocytes % 0  NEUT# 1.7 - 7.7 K/uL 3.9  Lymphs Abs 0.7 - 4.0 K/uL 1.3  Monocyte # 0.1 - 1.0 K/uL 0.6  Eosinophils Absolute 0.0 - 0.5 K/uL 0.2  Basophils Absolute 0.0 - 0.1 K/uL 0.0  Abs Immature Granulocytes 0.00 - 0.07 K/uL 0.01      Latest Ref Rng & Units 04/07/2023   10:06 AM 03/27/2023    1:03 PM 03/06/2023    1:12 PM  CMP  Glucose 70 - 99 mg/dL 096  045  409   BUN 8 - 23 mg/dL 19  36  21   Creatinine 0.44 - 1.00 mg/dL 8.11  9.14  7.82   Sodium 135 - 145 mmol/L 143  138  143   Potassium 3.5 - 5.1 mmol/L 4.0  3.5  3.9   Chloride 98 - 111 mmol/L 108  101  105   CO2 22 - 32 mmol/L 24  21  26    Calcium 8.9 - 10.3 mg/dL 9.3  9.1  9.7   Total Protein 6.5 - 8.1 g/dL 6.7  7.0  7.0   Total Bilirubin 0.0 - 1.2 mg/dL 0.3  0.6  0.3   Alkaline Phos 38 - 126 U/L 83  54  76   AST 15 - 41 U/L 26  36  22   ALT 0 - 44 U/L 7  17  7      Lab Results  Component Value Date   TOTALPROTELP 6.7 03/06/2023   ALBUMINELP 3.6 03/06/2023   A1GS 0.3 03/06/2023   A2GS 0.8 03/06/2023   BETS 1.2 03/06/2023   GAMS 0.9 03/06/2023   MSPIKE Not Observed 03/06/2023   Lab Results  Component Value Date   TIBC 314 08/04/2023   TIBC 409 06/05/2023   TIBC 365 04/07/2023   FERRITIN 50 08/04/2023   FERRITIN 15 06/05/2023   FERRITIN 21 04/07/2023   IRONPCTSAT 7 (L) 08/04/2023   IRONPCTSAT 6 (L) 06/05/2023   IRONPCTSAT 32 (H) 04/07/2023  Lab Results  Component Value Date   LDH 179 03/06/2023       Component Value Date/Time   TOTALPROTELP 6.7 03/06/2023 1312   ALBUMINELP 3.6 03/06/2023 1312   A1GS  0.3 03/06/2023 1312   A2GS 0.8 03/06/2023 1312   BETS 1.2 03/06/2023 1312   GAMS 0.9 03/06/2023 1312   MSPIKE Not Observed 03/06/2023 1312   LDH 179 03/06/2023 1312    Review Flowsheet  More data exists      Latest Ref Rng & Units 04/07/2023 06/05/2023 08/04/2023  Oncology Labs  Ferritin 11 - 307 ng/mL 21  15  50   %SAT 10.4 - 31.8 % 32  6  7      STUDIES:  No results found.    ASSESSMENT & PLAN:   Assessment/Plan:  78 y.o. female with iron deficiency anemia.  Her hemoglobin is just stable after receiving IV Feraheme again in April.  Iron studies are are equivocal and I could not add a soluble transferrin receptor today.  I will have her continue oral iron supplementation and take it with her vitamin C.  At this point, probably easier to refer her to GI for consideration of completion colonoscopy and possibly repeat EDG.  She requests a provider in [place].  I will plan to follow her more closely and see her back in a month for repeat clinical assessment. The patient understands all the plans discussed today and is in agreement with them.  She knows to contact our office if she develops concerns prior to her next appointment.     Alfonso Ike, PA-C   Physician Assistant [facility] [phone]

## 2023-08-04 ENCOUNTER — Inpatient Hospital Stay: Attending: Hematology and Oncology

## 2023-08-04 ENCOUNTER — Encounter: Payer: Self-pay | Admitting: Hematology and Oncology

## 2023-08-04 ENCOUNTER — Inpatient Hospital Stay (HOSPITAL_BASED_OUTPATIENT_CLINIC_OR_DEPARTMENT_OTHER): Admitting: Hematology and Oncology

## 2023-08-04 VITALS — BP 117/71 | HR 82 | Temp 98.6°F | Resp 18 | Ht 62.0 in | Wt 132.0 lb

## 2023-08-04 DIAGNOSIS — D500 Iron deficiency anemia secondary to blood loss (chronic): Secondary | ICD-10-CM | POA: Insufficient documentation

## 2023-08-04 LAB — CBC WITH DIFFERENTIAL (CANCER CENTER ONLY)
Abs Immature Granulocytes: 0.01 10*3/uL (ref 0.00–0.07)
Basophils Absolute: 0 10*3/uL (ref 0.0–0.1)
Basophils Relative: 1 %
Eosinophils Absolute: 0.2 10*3/uL (ref 0.0–0.5)
Eosinophils Relative: 3 %
HCT: 28.5 % — ABNORMAL LOW (ref 36.0–46.0)
Hemoglobin: 9 g/dL — ABNORMAL LOW (ref 12.0–15.0)
Immature Granulocytes: 0 %
Lymphocytes Relative: 22 %
Lymphs Abs: 1.3 10*3/uL (ref 0.7–4.0)
MCH: 31.4 pg (ref 26.0–34.0)
MCHC: 31.6 g/dL (ref 30.0–36.0)
MCV: 99.3 fL (ref 80.0–100.0)
Monocytes Absolute: 0.6 10*3/uL (ref 0.1–1.0)
Monocytes Relative: 10 %
Neutro Abs: 3.9 10*3/uL (ref 1.7–7.7)
Neutrophils Relative %: 64 %
Platelet Count: 313 10*3/uL (ref 150–400)
RBC: 2.87 MIL/uL — ABNORMAL LOW (ref 3.87–5.11)
RDW: 14.6 % (ref 11.5–15.5)
WBC Count: 6 10*3/uL (ref 4.0–10.5)
nRBC: 0 % (ref 0.0–0.2)

## 2023-08-04 LAB — IRON AND TIBC
Iron: 22 ug/dL — ABNORMAL LOW (ref 28–170)
Saturation Ratios: 7 % — ABNORMAL LOW (ref 10.4–31.8)
TIBC: 314 ug/dL (ref 250–450)
UIBC: 292 ug/dL

## 2023-08-04 LAB — FERRITIN: Ferritin: 50 ng/mL (ref 11–307)

## 2023-08-05 ENCOUNTER — Telehealth: Payer: Self-pay | Admitting: Hematology and Oncology

## 2023-08-05 ENCOUNTER — Encounter: Payer: Self-pay | Admitting: Hematology and Oncology

## 2023-08-05 NOTE — Telephone Encounter (Signed)
 Patient has been scheduled for follow-up visit per 08/03/23 LOS.  Pt noted appt details on personal planner/calendar.

## 2023-09-08 ENCOUNTER — Encounter: Payer: Self-pay | Admitting: Hematology and Oncology

## 2023-09-08 ENCOUNTER — Inpatient Hospital Stay

## 2023-09-08 ENCOUNTER — Inpatient Hospital Stay: Attending: Hematology and Oncology | Admitting: Hematology and Oncology

## 2023-09-08 ENCOUNTER — Telehealth: Payer: Self-pay | Admitting: Hematology and Oncology

## 2023-09-08 VITALS — BP 122/62 | HR 90 | Temp 98.0°F | Resp 18 | Ht 62.0 in | Wt 131.5 lb

## 2023-09-08 DIAGNOSIS — D500 Iron deficiency anemia secondary to blood loss (chronic): Secondary | ICD-10-CM

## 2023-09-08 LAB — CBC WITH DIFFERENTIAL (CANCER CENTER ONLY)
Abs Immature Granulocytes: 0.02 K/uL (ref 0.00–0.07)
Basophils Absolute: 0 K/uL (ref 0.0–0.1)
Basophils Relative: 0 %
Eosinophils Absolute: 0.1 K/uL (ref 0.0–0.5)
Eosinophils Relative: 2 %
HCT: 21.5 % — ABNORMAL LOW (ref 36.0–46.0)
Hemoglobin: 6.5 g/dL — CL (ref 12.0–15.0)
Immature Granulocytes: 0 %
Lymphocytes Relative: 16 %
Lymphs Abs: 1.2 K/uL (ref 0.7–4.0)
MCH: 27.3 pg (ref 26.0–34.0)
MCHC: 30.2 g/dL (ref 30.0–36.0)
MCV: 90.3 fL (ref 80.0–100.0)
Monocytes Absolute: 0.7 K/uL (ref 0.1–1.0)
Monocytes Relative: 9 %
Neutro Abs: 5.4 K/uL (ref 1.7–7.7)
Neutrophils Relative %: 73 %
Platelet Count: 357 K/uL (ref 150–400)
RBC: 2.38 MIL/uL — ABNORMAL LOW (ref 3.87–5.11)
RDW: 16.1 % — ABNORMAL HIGH (ref 11.5–15.5)
WBC Count: 7.4 K/uL (ref 4.0–10.5)
nRBC: 0 % (ref 0.0–0.2)

## 2023-09-08 LAB — CMP (CANCER CENTER ONLY)
ALT: 8 U/L (ref 0–44)
AST: 16 U/L (ref 15–41)
Albumin: 3.5 g/dL (ref 3.5–5.0)
Alkaline Phosphatase: 89 U/L (ref 38–126)
Anion gap: 12 (ref 5–15)
BUN: 23 mg/dL (ref 8–23)
CO2: 23 mmol/L (ref 22–32)
Calcium: 9.1 mg/dL (ref 8.9–10.3)
Chloride: 104 mmol/L (ref 98–111)
Creatinine: 1 mg/dL (ref 0.44–1.00)
GFR, Estimated: 57 mL/min — ABNORMAL LOW (ref >60)
Glucose, Bld: 104 mg/dL — ABNORMAL HIGH (ref 70–99)
Potassium: 4.1 mmol/L (ref 3.5–5.1)
Sodium: 139 mmol/L (ref 135–145)
Total Bilirubin: <0.2 mg/dL (ref 0.0–1.2)
Total Protein: 6.5 g/dL (ref 6.5–8.1)

## 2023-09-08 LAB — IRON AND TIBC
Iron: 16 ug/dL — ABNORMAL LOW (ref 28–170)
Saturation Ratios: 5 % — ABNORMAL LOW (ref 10.4–31.8)
TIBC: 346 ug/dL (ref 250–450)
UIBC: 330 ug/dL

## 2023-09-08 LAB — FERRITIN: Ferritin: 7 ng/mL — ABNORMAL LOW (ref 11–307)

## 2023-09-08 LAB — CBC (CANCER CENTER ONLY)
HCT: 22.3 % — ABNORMAL LOW (ref 36.0–46.0)
Hemoglobin: 6.8 g/dL — CL (ref 12.0–15.0)
MCH: 27.4 pg (ref 26.0–34.0)
MCHC: 30.5 g/dL (ref 30.0–36.0)
MCV: 89.9 fL (ref 80.0–100.0)
Platelet Count: 372 K/uL (ref 150–400)
RBC: 2.48 MIL/uL — ABNORMAL LOW (ref 3.87–5.11)
RDW: 16.1 % — ABNORMAL HIGH (ref 11.5–15.5)
WBC Count: 8.1 K/uL (ref 4.0–10.5)
nRBC: 0 % (ref 0.0–0.2)

## 2023-09-08 LAB — SAMPLE TO BLOOD BANK

## 2023-09-08 LAB — PREPARE RBC (CROSSMATCH)

## 2023-09-08 NOTE — Progress Notes (Unsigned)
 [facility]  [address] [place],  [place]  [address] [phone]  Clinic Day:  08/04/2023  Referring physician: Teressa Harrie HERO, FNP   HISTORY OF PRESENT ILLNESS:  The patient is a 78 y.o. female with iron deficiency anemia due to GI bleed.  She was hospitalized in September 2024 when her hemoglobin dropped to 5.1.  EGD and incomplete colonoscopy did not reveal any site of GI blood loss.  CT colonography was recommended by GI during hospitalization, but had not been done, so I ordered this.  She was scheduled for this in February, but she cancelled her appointment, stating she thought she was supposed to have an upper endoscopy even though she did prep for colonography.  She has paroxysmal atrial fibrillation, but is not on anticoagulation due to the recent GI bleed and severe anemia.  She had persistent iron deficiency at her consultation in January, but she desired to hold off on IV iron, so started oral iron.  Further evaluation for causes of anemia did not reveal another specific etiology.  Her hemoglobin remained low at 10.3 in February.  Her hemoglobin dropped to 9.1 in April, so she was given IV iron in the form of Feraheme.  At her visit in June, I recommended referral to [facility].  She is here for repeat clinical assessment.  She denies progressive fatigue concerning for worsening anemia.  She has not taken her iron supplement for about 2 weeks as she was having abdominal pain after I had her take it with vitamin C.  She otherwise denies any overt form of blood loss.  She denies any steroid or nonsteroidal anti-inflammatory use.  She has not seen GI.  PHYSICAL EXAM:  Blood pressure 122/62, pulse 90, temperature 98 F (36.7 C), resp. rate 18, height 5' 2 (1.575 m), weight 131 lb 8 oz (59.6 kg), SpO2 100%. Wt Readings from Last 3 Encounters:  09/08/23 131 lb 8 oz (59.6 kg)  08/04/23 132 lb (59.9 kg)  06/05/23 135 lb 11.2 oz (61.6 kg)   Body mass index is 24.05  kg/m.  Performance status (ECOG): 0 - Asymptomatic  Physical Exam Vitals and nursing note reviewed.  Constitutional:      General: She is not in acute distress.    Appearance: Normal appearance.  HENT:     Head: Normocephalic and atraumatic.     Mouth/Throat:     Mouth: Mucous membranes are moist.     Pharynx: Oropharynx is clear. No oropharyngeal exudate or posterior oropharyngeal erythema.  Eyes:     General: No scleral icterus.    Extraocular Movements: Extraocular movements intact.     Conjunctiva/sclera: Conjunctivae normal.     Pupils: Pupils are equal, round, and reactive to light.  Cardiovascular:     Rate and Rhythm: Normal rate and regular rhythm.     Heart sounds: Normal heart sounds. No murmur heard.    No friction rub. No gallop.  Pulmonary:     Effort: Pulmonary effort is normal.     Breath sounds: Normal breath sounds. No wheezing, rhonchi or rales.  Abdominal:     General: There is no distension.     Palpations: Abdomen is soft. There is no hepatomegaly, splenomegaly or mass.     Tenderness: There is no abdominal tenderness.  Musculoskeletal:        General: Normal range of motion.     Cervical back: Normal range of motion and neck supple. No tenderness.  Right lower leg: No edema.     Left lower leg: No edema.  Lymphadenopathy:     Cervical: No cervical adenopathy.     Upper Body:     Right upper body: No supraclavicular or axillary adenopathy.     Left upper body: No supraclavicular or axillary adenopathy.     Lower Body: No right inguinal adenopathy. No left inguinal adenopathy.  Skin:    General: Skin is warm and dry.     Coloration: Skin is not jaundiced.     Findings: No rash.  Neurological:     Mental Status: She is alert and oriented to person, place, and time.     Cranial Nerves: No cranial nerve deficit.  Psychiatric:        Mood and Affect: Mood normal.        Behavior: Behavior normal.        Thought Content: Thought content normal.     LABS:      Latest Ref Rng & Units 09/08/2023    1:35 PM 08/04/2023   10:48 AM 06/05/2023   10:31 AM  CBC  WBC 4.0 - 10.5 K/uL 7.4  6.0  5.8   Hemoglobin 12.0 - 15.0 g/dL 6.5  9.0  9.1   Hematocrit 36.0 - 46.0 % 21.5  28.5  30.2   Platelets 150 - 400 K/uL 357  313  312     Latest Reference Range & Units 08/04/23 10:48  Neutrophils % 64  Lymphocytes % 22  Monocytes Relative % 10  Eosinophil % 3  Basophil % 1  Immature Granulocytes % 0  NEUT# 1.7 - 7.7 K/uL 3.9  Lymphs Abs 0.7 - 4.0 K/uL 1.3  Monocyte # 0.1 - 1.0 K/uL 0.6  Eosinophils Absolute 0.0 - 0.5 K/uL 0.2  Basophils Absolute 0.0 - 0.1 K/uL 0.0  Abs Immature Granulocytes 0.00 - 0.07 K/uL 0.01      Latest Ref Rng & Units 09/08/2023    1:35 PM 04/07/2023   10:06 AM 03/27/2023    1:03 PM  CMP  Glucose 70 - 99 mg/dL 895  877  876   BUN 8 - 23 mg/dL 23  19  36   Creatinine 0.44 - 1.00 mg/dL 8.99  9.15  8.21   Sodium 135 - 145 mmol/L 139  143  138   Potassium 3.5 - 5.1 mmol/L 4.1  4.0  3.5   Chloride 98 - 111 mmol/L 104  108  101   CO2 22 - 32 mmol/L 23  24  21    Calcium 8.9 - 10.3 mg/dL 9.1  9.3  9.1   Total Protein 6.5 - 8.1 g/dL 6.5  6.7  7.0   Total Bilirubin 0.0 - 1.2 mg/dL <9.7  0.3  0.6   Alkaline Phos 38 - 126 U/L 89  83  54   AST 15 - 41 U/L 16  26  36   ALT 0 - 44 U/L 8  7  17      Lab Results  Component Value Date   TOTALPROTELP 6.7 03/06/2023   ALBUMINELP 3.6 03/06/2023   A1GS 0.3 03/06/2023   A2GS 0.8 03/06/2023   BETS 1.2 03/06/2023   GAMS 0.9 03/06/2023   MSPIKE Not Observed 03/06/2023   Lab Results  Component Value Date   TIBC 314 08/04/2023   TIBC 409 06/05/2023   TIBC 365 04/07/2023   FERRITIN 50 08/04/2023   FERRITIN 15 06/05/2023   FERRITIN 21 04/07/2023   IRONPCTSAT 7 (L)  08/04/2023   IRONPCTSAT 6 (L) 06/05/2023   IRONPCTSAT 32 (H) 04/07/2023   Lab Results  Component Value Date   LDH 179 03/06/2023       Component Value Date/Time   TOTALPROTELP 6.7 03/06/2023 1312   ALBUMINELP  3.6 03/06/2023 1312   A1GS 0.3 03/06/2023 1312   A2GS 0.8 03/06/2023 1312   BETS 1.2 03/06/2023 1312   GAMS 0.9 03/06/2023 1312   MSPIKE Not Observed 03/06/2023 1312   LDH 179 03/06/2023 1312    Review Flowsheet  More data exists      Latest Ref Rng & Units 04/07/2023 06/05/2023 08/04/2023  Oncology Labs  Ferritin 11 - 307 ng/mL 21  15  50   %SAT 10.4 - 31.8 % 32  6  7      STUDIES:  No results found.    ASSESSMENT & PLAN:   Assessment/Plan:  78 y.o. female with iron deficiency anemia.  Her hemoglobin has dropped significantly, so I will arrange for her to receive 2 units of PRBC's this week.  She may also need additional IV iron. We referred her to [facility] for consideration of completion colonoscopy and possibly repeat EDG, but apparently she did not return their call.  We will give her the office number to contact them.  I will plan to in 2 weeks for repeat clinical assessment. The patient understands all the plans discussed today and is in agreement with them.  She knows to contact our office if she develops concerns prior to her next appointment.     Andrez DELENA Foy, PA-C   Physician Assistant [facility] [phone]

## 2023-09-08 NOTE — Telephone Encounter (Signed)
 Patient has been scheduled for follow-up visit per 09/08/23 LOS.  Pt given an appt calendar with date and time.

## 2023-09-08 NOTE — Progress Notes (Signed)
 CRITICAL VALUE STICKER  CRITICAL VALUE:  6.8  RECEIVER (on-site recipient of call):  Woodie Kapur RN  DATE & TIME NOTIFIED:   09/08/2023 @ 1506  MESSENGER (representative from lab):  [facility] HEATH lab  MD NOTIFIED:   Andrez HERO., PA  TIME OF NOTIFICATION:  1507  RESPONSE:   Transfuse 2 units PRBC.

## 2023-09-08 NOTE — Progress Notes (Signed)
 CRITICAL VALUE STICKER  CRITICAL VALUE:  Hgb 6.5  RECEIVER (on-site recipient of call):  Woodie Kapur RN  DATE & TIME NOTIFIED:   09/08/2023 @ 1356  MESSENGER (representative from lab):  [facility]  MD NOTIFIED:   Andrez HERO. PA  TIME OF NOTIFICATION:  1400  RESPONSE:   Repeat CBC.

## 2023-09-09 ENCOUNTER — Encounter: Payer: Self-pay | Admitting: Hematology and Oncology

## 2023-09-09 ENCOUNTER — Telehealth: Payer: Self-pay | Admitting: Hematology and Oncology

## 2023-09-09 ENCOUNTER — Inpatient Hospital Stay

## 2023-09-09 DIAGNOSIS — D500 Iron deficiency anemia secondary to blood loss (chronic): Secondary | ICD-10-CM | POA: Diagnosis not present

## 2023-09-09 LAB — SOLUBLE TRANSFERRIN RECEPTOR: Transferrin Receptor: 46.5 nmol/L — ABNORMAL HIGH (ref 12.2–27.3)

## 2023-09-09 MED ORDER — SODIUM CHLORIDE 0.9% IV SOLUTION
250.0000 mL | INTRAVENOUS | Status: DC
  Administered 2023-09-09 (×2): 250 mL via INTRAVENOUS

## 2023-09-09 MED ORDER — ACETAMINOPHEN 325 MG PO TABS
650.0000 mg | ORAL_TABLET | Freq: Once | ORAL | Status: AC
  Administered 2023-09-09: 650 mg via ORAL
  Filled 2023-09-09: qty 2

## 2023-09-09 MED ORDER — DIPHENHYDRAMINE HCL 25 MG PO CAPS
25.0000 mg | ORAL_CAPSULE | Freq: Once | ORAL | Status: AC
  Administered 2023-09-09: 25 mg via ORAL
  Filled 2023-09-09: qty 1

## 2023-09-09 NOTE — Patient Instructions (Signed)

## 2023-09-09 NOTE — Telephone Encounter (Signed)
 Patient has been scheduled for follow-up visit per 09/09/23 LOS.  Pt given an appt calendar with date and time.

## 2023-09-10 ENCOUNTER — Encounter

## 2023-09-10 LAB — TYPE AND SCREEN
ABO/RH(D): O POS
Antibody Screen: NEGATIVE
Unit division: 0
Unit division: 0

## 2023-09-10 LAB — BPAM RBC
Blood Product Expiration Date: 202508092359
Blood Product Expiration Date: 202508102359
ISSUE DATE / TIME: 202507091002
ISSUE DATE / TIME: 202507091002
Unit Type and Rh: 5100
Unit Type and Rh: 5100

## 2023-09-11 ENCOUNTER — Encounter: Payer: Self-pay | Admitting: Hematology and Oncology

## 2023-09-11 ENCOUNTER — Inpatient Hospital Stay

## 2023-09-11 VITALS — BP 116/60 | HR 68 | Temp 97.9°F | Resp 18

## 2023-09-11 DIAGNOSIS — D500 Iron deficiency anemia secondary to blood loss (chronic): Secondary | ICD-10-CM | POA: Diagnosis not present

## 2023-09-11 MED ORDER — SODIUM CHLORIDE 0.9 % IV SOLN
510.0000 mg | Freq: Once | INTRAVENOUS | Status: AC
  Administered 2023-09-11: 510 mg via INTRAVENOUS
  Filled 2023-09-11: qty 510

## 2023-09-11 MED ORDER — LORATADINE 10 MG PO TABS
10.0000 mg | ORAL_TABLET | Freq: Once | ORAL | Status: AC
  Administered 2023-09-11: 10 mg via ORAL
  Filled 2023-09-11: qty 1

## 2023-09-11 MED ORDER — SODIUM CHLORIDE 0.9 % IV SOLN: INTRAVENOUS | Status: DC

## 2023-09-11 MED ORDER — ACETAMINOPHEN 325 MG PO TABS
650.0000 mg | ORAL_TABLET | Freq: Once | ORAL | Status: AC
  Administered 2023-09-11: 650 mg via ORAL
  Filled 2023-09-11: qty 2

## 2023-09-11 NOTE — Patient Instructions (Signed)

## 2023-09-14 ENCOUNTER — Telehealth: Payer: Self-pay | Admitting: Internal Medicine

## 2023-09-14 NOTE — Telephone Encounter (Signed)
 Good morning Dr. Federico,   DOD AM 7/14  We received a referral for patient to discuss having a EGD and colonoscopy due to anemia. Patient attempted to have colonoscopy completed with [facility] but it was not completed. Patient also stated they were unable to find the cause of Anemia. Patient's previous records are in Epic for you to review and advise on scheduling.   Thank you

## 2023-09-14 NOTE — Telephone Encounter (Signed)
 Okay to schedule for OV.  Colonoscopy 11/27/22: Bowel prep was adequate.  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 Significant luminal narrowing  noted in the sigmoid colon precluding advancement of scope. Could not  traverse despite changing patient position. Surrounding muscular  hypertrophy noted. No obvious mass seen. Procedure was then aborted.  We will obtain outpatient CT colonography for further evaluation. If no  overt bleeding then rest of the workup for IDA can be done as outpatient   EGD 11/27/22: Findings:  The esophagus, stomach, duodenal bulb and 2nd part of the duodenum  appeared normal.

## 2023-09-17 ENCOUNTER — Encounter: Payer: Self-pay | Admitting: Physician Assistant

## 2023-09-18 ENCOUNTER — Inpatient Hospital Stay

## 2023-09-18 VITALS — BP 119/64 | HR 80 | Temp 97.8°F | Resp 18

## 2023-09-18 DIAGNOSIS — D500 Iron deficiency anemia secondary to blood loss (chronic): Secondary | ICD-10-CM

## 2023-09-18 MED ORDER — SODIUM CHLORIDE 0.9 % IV SOLN: INTRAVENOUS | Status: DC

## 2023-09-18 MED ORDER — SODIUM CHLORIDE 0.9 % IV SOLN
510.0000 mg | Freq: Once | INTRAVENOUS | Status: AC
  Administered 2023-09-18: 510 mg via INTRAVENOUS
  Filled 2023-09-18: qty 510

## 2023-09-18 MED ORDER — ACETAMINOPHEN 325 MG PO TABS
650.0000 mg | ORAL_TABLET | Freq: Once | ORAL | Status: AC
  Administered 2023-09-18: 650 mg via ORAL
  Filled 2023-09-18: qty 2

## 2023-09-18 MED ORDER — LORATADINE 10 MG PO TABS
10.0000 mg | ORAL_TABLET | Freq: Once | ORAL | Status: AC
  Administered 2023-09-18: 10 mg via ORAL
  Filled 2023-09-18: qty 1

## 2023-09-18 NOTE — Patient Instructions (Signed)

## 2023-09-22 ENCOUNTER — Other Ambulatory Visit

## 2023-09-22 ENCOUNTER — Ambulatory Visit: Admitting: Hematology and Oncology

## 2023-10-01 ENCOUNTER — Inpatient Hospital Stay (HOSPITAL_BASED_OUTPATIENT_CLINIC_OR_DEPARTMENT_OTHER): Admitting: Hematology and Oncology

## 2023-10-01 ENCOUNTER — Inpatient Hospital Stay

## 2023-10-01 ENCOUNTER — Encounter: Payer: Self-pay | Admitting: Hematology and Oncology

## 2023-10-01 ENCOUNTER — Telehealth: Payer: Self-pay | Admitting: Hematology and Oncology

## 2023-10-01 VITALS — BP 127/70 | HR 90 | Temp 97.7°F | Resp 20 | Ht 62.0 in | Wt 130.1 lb

## 2023-10-01 DIAGNOSIS — D500 Iron deficiency anemia secondary to blood loss (chronic): Secondary | ICD-10-CM

## 2023-10-01 LAB — CBC WITH DIFFERENTIAL (CANCER CENTER ONLY)
Abs Immature Granulocytes: 0.02 K/uL (ref 0.00–0.07)
Basophils Absolute: 0 K/uL (ref 0.0–0.1)
Basophils Relative: 1 %
Eosinophils Absolute: 0.2 K/uL (ref 0.0–0.5)
Eosinophils Relative: 3 %
HCT: 26.9 % — ABNORMAL LOW (ref 36.0–46.0)
Hemoglobin: 8.1 g/dL — ABNORMAL LOW (ref 12.0–15.0)
Immature Granulocytes: 0 %
Lymphocytes Relative: 17 %
Lymphs Abs: 1 K/uL (ref 0.7–4.0)
MCH: 28.8 pg (ref 26.0–34.0)
MCHC: 30.1 g/dL (ref 30.0–36.0)
MCV: 95.7 fL (ref 80.0–100.0)
Monocytes Absolute: 0.5 K/uL (ref 0.1–1.0)
Monocytes Relative: 9 %
Neutro Abs: 4.1 K/uL (ref 1.7–7.7)
Neutrophils Relative %: 70 %
Platelet Count: 316 K/uL (ref 150–400)
RBC: 2.81 MIL/uL — ABNORMAL LOW (ref 3.87–5.11)
RDW: 22.9 % — ABNORMAL HIGH (ref 11.5–15.5)
WBC Count: 5.9 K/uL (ref 4.0–10.5)
nRBC: 0 % (ref 0.0–0.2)

## 2023-10-01 LAB — SAMPLE TO BLOOD BANK

## 2023-10-01 NOTE — Progress Notes (Cosign Needed)
 [facility]  [address] [place],  [place]  [address] [phone]  Clinic Day:  10/01/2023  Referring physician: Teressa Harrie HERO, FNP   HISTORY OF PRESENT ILLNESS:  The patient is a 78 y.o. female with iron deficiency anemia felt to be due to chronic GI blood loss. She did not tolerate oral iron supplement due to abdominal pain.  She has received IV iron in the form of Feraheme as needed.  Her hemoglobin dropped from 9 in June to 6.8 earlier this month.  She was transfused 2 units of packed red blood cells.  She received IV Feraheme.  She states she continues to feel well and denies progressive fatigue concerning worsening anemia. She denies any overt form of blood loss.  Colonoscopy in September 2024 could not be completed due to severe diverticulosis.  I scheduled her for CT colonography in January, but she canceled stating this was not the right test.  I reordered it in April and apparently this was not scheduled.  She states she is scheduled to be seen at [facility] in September.  VITALS:   Blood pressure 127/70, pulse 90, temperature 97.7 F (36.5 C), temperature source Oral, resp. rate 20, height 5' 2 (1.575 m), weight 130 lb 1.6 oz (59 kg), SpO2 91%. Wt Readings from Last 3 Encounters:  10/01/23 130 lb 1.6 oz (59 kg)  09/08/23 131 lb 8 oz (59.6 kg)  08/04/23 132 lb (59.9 kg)   Body mass index is 23.8 kg/m.  Performance status (ECOG): 0 - Asymptomatic  PHYSICAL EXAM:   Physical Exam Vitals and nursing note reviewed.  Constitutional:      General: She is not in acute distress.    Appearance: Normal appearance.  HENT:     Head: Normocephalic and atraumatic.     Mouth/Throat:     Mouth: Mucous membranes are moist.     Pharynx: Oropharynx is clear. No oropharyngeal exudate or posterior oropharyngeal erythema.  Eyes:     General: No scleral icterus.    Extraocular Movements: Extraocular movements intact.     Conjunctiva/sclera: Conjunctivae normal.      Pupils: Pupils are equal, round, and reactive to light.  Cardiovascular:     Rate and Rhythm: Normal rate and regular rhythm.     Heart sounds: Normal heart sounds. No murmur heard.    No friction rub. No gallop.  Pulmonary:     Effort: Pulmonary effort is normal.     Breath sounds: Normal breath sounds. No wheezing, rhonchi or rales.  Abdominal:     General: There is no distension.     Palpations: Abdomen is soft. There is no hepatomegaly, splenomegaly or mass.     Tenderness: There is no abdominal tenderness.  Musculoskeletal:        General: Normal range of motion.     Cervical back: Normal range of motion and neck supple. No tenderness.     Right lower leg: No edema.     Left lower leg: No edema.  Lymphadenopathy:     Cervical: No cervical adenopathy.     Upper Body:     Right upper body: No supraclavicular or axillary adenopathy.     Left upper body: No supraclavicular or axillary adenopathy.     Lower Body: No right inguinal adenopathy. No left inguinal adenopathy.  Skin:    General: Skin is warm and dry.     Coloration: Skin is not jaundiced.     Findings: No rash.  Neurological:     Mental Status: She is alert and oriented to person, place, and time.     Cranial Nerves: No cranial nerve deficit.  Psychiatric:        Mood and Affect: Mood normal.        Behavior: Behavior normal.        Thought Content: Thought content normal.      LABS:      Latest Ref Rng & Units 10/01/2023    8:46 AM 09/08/2023    2:43 PM 09/08/2023    1:35 PM  CBC  WBC 4.0 - 10.5 K/uL 5.9  8.1  7.4   Hemoglobin 12.0 - 15.0 g/dL 8.1  6.8  6.5   Hematocrit 36.0 - 46.0 % 26.9  22.3  21.5   Platelets 150 - 400 K/uL 316  372  357       Latest Ref Rng & Units 09/08/2023    1:35 PM 04/07/2023   10:06 AM 03/27/2023    1:03 PM  CMP  Glucose 70 - 99 mg/dL 895  877  876   BUN 8 - 23 mg/dL 23  19  36   Creatinine 0.44 - 1.00 mg/dL 8.99  9.15  8.21   Sodium 135 - 145 mmol/L 139  143  138   Potassium  3.5 - 5.1 mmol/L 4.1  4.0  3.5   Chloride 98 - 111 mmol/L 104  108  101   CO2 22 - 32 mmol/L 23  24  21    Calcium 8.9 - 10.3 mg/dL 9.1  9.3  9.1   Total Protein 6.5 - 8.1 g/dL 6.5  6.7  7.0   Total Bilirubin 0.0 - 1.2 mg/dL <9.7  0.3  0.6   Alkaline Phos 38 - 126 U/L 89  83  54   AST 15 - 41 U/L 16  26  36   ALT 0 - 44 U/L 8  7  17       Lab Results  Component Value Date   TOTALPROTELP 6.7 03/06/2023   ALBUMINELP 3.6 03/06/2023   A1GS 0.3 03/06/2023   A2GS 0.8 03/06/2023   BETS 1.2 03/06/2023   GAMS 0.9 03/06/2023   MSPIKE Not Observed 03/06/2023   Lab Results  Component Value Date   TIBC 346 09/08/2023   TIBC 314 08/04/2023   TIBC 409 06/05/2023   FERRITIN 7 (L) 09/08/2023   FERRITIN 50 08/04/2023   FERRITIN 15 06/05/2023   IRONPCTSAT 5 (L) 09/08/2023   IRONPCTSAT 7 (L) 08/04/2023   IRONPCTSAT 6 (L) 06/05/2023   Lab Results  Component Value Date   LDH 179 03/06/2023       Component Value Date/Time   TOTALPROTELP 6.7 03/06/2023 1312   ALBUMINELP 3.6 03/06/2023 1312   A1GS 0.3 03/06/2023 1312   A2GS 0.8 03/06/2023 1312   BETS 1.2 03/06/2023 1312   GAMS 0.9 03/06/2023 1312   MSPIKE Not Observed 03/06/2023 1312   LDH 179 03/06/2023 1312    Review Flowsheet  More data exists      Latest Ref Rng & Units 06/05/2023 08/04/2023 09/08/2023  Oncology Labs  Ferritin 11 - 307 ng/mL 15  50  7   %SAT 10.4 - 31.8 % 6  7  5       STUDIES:   No results found.    ASSESSMENT & PLAN:   Assessment/Plan:  78 y.o. female with iron deficiency anemia felt to be most likely due to chronic GI blood loss.  Her hemoglobin  improved with transfusion.  She has now completed IV Feraheme.  She is scheduled to see [facility] in September and we will await their opinion as to whether she should have CT colonography versus completion colonoscopy plus or minus capsule endoscopy.  I will plan to see her back in 3 weeks for repeat clinical assessment.  The patient understands all the plans  discussed today and is in agreement with them.  She knows to contact our office if she develops concerns prior to her next appointment.     Andrez DELENA Foy, PA-C   Physician Assistant [facility] [place] [phone]

## 2023-10-01 NOTE — Telephone Encounter (Signed)
 Patient has been scheduled for follow-up visit per 10/01/23 LOS.  Pt given an appt calendar with date and time.

## 2023-10-20 ENCOUNTER — Other Ambulatory Visit: Payer: Self-pay | Admitting: Hematology and Oncology

## 2023-10-20 DIAGNOSIS — D500 Iron deficiency anemia secondary to blood loss (chronic): Secondary | ICD-10-CM

## 2023-10-21 ENCOUNTER — Inpatient Hospital Stay

## 2023-10-21 ENCOUNTER — Encounter: Payer: Self-pay | Admitting: Hematology and Oncology

## 2023-10-21 ENCOUNTER — Inpatient Hospital Stay: Attending: Hematology and Oncology | Admitting: Hematology and Oncology

## 2023-10-21 VITALS — BP 117/61 | HR 94 | Temp 98.0°F | Resp 20 | Ht 62.0 in | Wt 130.5 lb

## 2023-10-21 DIAGNOSIS — D500 Iron deficiency anemia secondary to blood loss (chronic): Secondary | ICD-10-CM | POA: Diagnosis not present

## 2023-10-21 DIAGNOSIS — D509 Iron deficiency anemia, unspecified: Secondary | ICD-10-CM | POA: Insufficient documentation

## 2023-10-21 DIAGNOSIS — K922 Gastrointestinal hemorrhage, unspecified: Secondary | ICD-10-CM | POA: Insufficient documentation

## 2023-10-21 LAB — IRON AND TIBC
Iron: 24 ug/dL — ABNORMAL LOW (ref 28–170)
Saturation Ratios: 10 % — ABNORMAL LOW (ref 10.4–31.8)
TIBC: 235 ug/dL — ABNORMAL LOW (ref 250–450)
UIBC: 211 ug/dL

## 2023-10-21 LAB — CBC WITH DIFFERENTIAL (CANCER CENTER ONLY)
Abs Immature Granulocytes: 0.02 K/uL (ref 0.00–0.07)
Basophils Absolute: 0 K/uL (ref 0.0–0.1)
Basophils Relative: 0 %
Eosinophils Absolute: 0.2 K/uL (ref 0.0–0.5)
Eosinophils Relative: 2 %
HCT: 26.1 % — ABNORMAL LOW (ref 36.0–46.0)
Hemoglobin: 8 g/dL — ABNORMAL LOW (ref 12.0–15.0)
Immature Granulocytes: 0 %
Lymphocytes Relative: 15 %
Lymphs Abs: 1.1 K/uL (ref 0.7–4.0)
MCH: 29.7 pg (ref 26.0–34.0)
MCHC: 30.7 g/dL (ref 30.0–36.0)
MCV: 97 fL (ref 80.0–100.0)
Monocytes Absolute: 0.7 K/uL (ref 0.1–1.0)
Monocytes Relative: 9 %
Neutro Abs: 5.3 K/uL (ref 1.7–7.7)
Neutrophils Relative %: 74 %
Platelet Count: 364 K/uL (ref 150–400)
RBC: 2.69 MIL/uL — ABNORMAL LOW (ref 3.87–5.11)
RDW: 19 % — ABNORMAL HIGH (ref 11.5–15.5)
WBC Count: 7.3 K/uL (ref 4.0–10.5)
nRBC: 0 % (ref 0.0–0.2)

## 2023-10-21 LAB — SAMPLE TO BLOOD BANK

## 2023-10-21 LAB — FERRITIN: Ferritin: 63 ng/mL (ref 11–307)

## 2023-10-21 NOTE — Progress Notes (Cosign Needed)
 [facility]  [address] [place],  [place]  [address] [phone]  Clinic Day:  10/21/2023  Referring physician: Teressa Harrie HERO, FNP   HISTORY OF PRESENT ILLNESS:  The patient is a 78 y.o. female with iron deficiency anemia felt to be due to chronic GI blood loss. She did not tolerate oral iron supplement due to abdominal pain.  She has received IV iron in the form of Feraheme as needed.  Her hemoglobin dropped from 6.8 in July.  She was transfused 2 units of packed red blood cells, then received IV Feraheme again.  She is here today for repeat clinical assessment.  She states she continues to feel well and denies progressive fatigue concerning worsening anemia. She denies any overt form of blood loss.  She is scheduled to be seen at [facility] in September.  VITALS:   Blood pressure 117/61, pulse 94, temperature 98 F (36.7 C), temperature source Oral, resp. rate 20, height 5' 2 (1.575 m), weight 130 lb 8 oz (59.2 kg), SpO2 98%. Wt Readings from Last 3 Encounters:  10/21/23 130 lb 8 oz (59.2 kg)  10/01/23 130 lb 1.6 oz (59 kg)  09/08/23 131 lb 8 oz (59.6 kg)   Body mass index is 23.87 kg/m.  Performance status (ECOG): 0 - Asymptomatic  PHYSICAL EXAM:   Physical Exam Vitals and nursing note reviewed.  Constitutional:      General: She is not in acute distress.    Appearance: Normal appearance.  HENT:     Head: Normocephalic and atraumatic.     Mouth/Throat:     Mouth: Mucous membranes are moist.     Pharynx: Oropharynx is clear. No oropharyngeal exudate or posterior oropharyngeal erythema.  Eyes:     General: No scleral icterus.    Extraocular Movements: Extraocular movements intact.     Conjunctiva/sclera: Conjunctivae normal.     Pupils: Pupils are equal, round, and reactive to light.  Cardiovascular:     Rate and Rhythm: Normal rate and regular rhythm.     Heart sounds: Normal heart sounds. No murmur heard.    No friction rub. No gallop.   Pulmonary:     Effort: Pulmonary effort is normal.     Breath sounds: Normal breath sounds. No wheezing, rhonchi or rales.  Abdominal:     General: There is no distension.     Palpations: Abdomen is soft. There is no hepatomegaly, splenomegaly or mass.     Tenderness: There is no abdominal tenderness.  Musculoskeletal:        General: Normal range of motion.     Cervical back: Normal range of motion and neck supple. No tenderness.     Right lower leg: No edema.     Left lower leg: No edema.  Lymphadenopathy:     Cervical: No cervical adenopathy.     Upper Body:     Right upper body: No supraclavicular or axillary adenopathy.     Left upper body: No supraclavicular or axillary adenopathy.     Lower Body: No right inguinal adenopathy. No left inguinal adenopathy.  Skin:    General: Skin is warm and dry.     Coloration: Skin is not jaundiced.     Findings: No rash.  Neurological:     Mental Status: She is alert and oriented to person, place, and time.     Cranial Nerves: No cranial nerve deficit.  Psychiatric:        Mood and Affect: Mood  normal.        Behavior: Behavior normal.        Thought Content: Thought content normal.      LABS:      Latest Ref Rng & Units 10/21/2023   10:53 AM 10/01/2023    8:46 AM 09/08/2023    2:43 PM  CBC  WBC 4.0 - 10.5 K/uL 7.3  5.9  8.1   Hemoglobin 12.0 - 15.0 g/dL 8.0  8.1  6.8   Hematocrit 36.0 - 46.0 % 26.1  26.9  22.3   Platelets 150 - 400 K/uL 364  316  372       Latest Ref Rng & Units 09/08/2023    1:35 PM 04/07/2023   10:06 AM 03/27/2023    1:03 PM  CMP  Glucose 70 - 99 mg/dL 895  877  876   BUN 8 - 23 mg/dL 23  19  36   Creatinine 0.44 - 1.00 mg/dL 8.99  9.15  8.21   Sodium 135 - 145 mmol/L 139  143  138   Potassium 3.5 - 5.1 mmol/L 4.1  4.0  3.5   Chloride 98 - 111 mmol/L 104  108  101   CO2 22 - 32 mmol/L 23  24  21    Calcium 8.9 - 10.3 mg/dL 9.1  9.3  9.1   Total Protein 6.5 - 8.1 g/dL 6.5  6.7  7.0   Total Bilirubin 0.0  - 1.2 mg/dL <9.7  0.3  0.6   Alkaline Phos 38 - 126 U/L 89  83  54   AST 15 - 41 U/L 16  26  36   ALT 0 - 44 U/L 8  7  17       Lab Results  Component Value Date   TOTALPROTELP 6.7 03/06/2023   ALBUMINELP 3.6 03/06/2023   A1GS 0.3 03/06/2023   A2GS 0.8 03/06/2023   BETS 1.2 03/06/2023   GAMS 0.9 03/06/2023   MSPIKE Not Observed 03/06/2023   Lab Results  Component Value Date   TIBC 346 09/08/2023   TIBC 314 08/04/2023   TIBC 409 06/05/2023   FERRITIN 7 (L) 09/08/2023   FERRITIN 50 08/04/2023   FERRITIN 15 06/05/2023   IRONPCTSAT 5 (L) 09/08/2023   IRONPCTSAT 7 (L) 08/04/2023   IRONPCTSAT 6 (L) 06/05/2023   Lab Results  Component Value Date   LDH 179 03/06/2023       Component Value Date/Time   TOTALPROTELP 6.7 03/06/2023 1312   ALBUMINELP 3.6 03/06/2023 1312   A1GS 0.3 03/06/2023 1312   A2GS 0.8 03/06/2023 1312   BETS 1.2 03/06/2023 1312   GAMS 0.9 03/06/2023 1312   MSPIKE Not Observed 03/06/2023 1312   LDH 179 03/06/2023 1312    Review Flowsheet  More data exists      Latest Ref Rng & Units 06/05/2023 08/04/2023 09/08/2023  Oncology Labs  Ferritin 11 - 307 ng/mL 15  50  7   %SAT 10.4 - 31.8 % 6  7  5       STUDIES:   No results found.    ASSESSMENT & PLAN:   Assessment/Plan:  78 y.o. female with iron deficiency anemia felt to be most likely due to chronic GI blood loss.  She does have evidence of recurrent iron deficiency.  She is scheduled to see [facility] in September and we will await their opinion as to what additional evaluation should be done.  I will plan to have her see Eleanor Bach, NP  in 4 weeks for repeat clinical assessment.  The patient understands all the plans discussed today and is in agreement with them.  She knows to contact our office if she develops concerns prior to her next appointment.     Andrez DELENA Foy, PA-C   Physician Assistant [facility] [phone]

## 2023-11-10 NOTE — Progress Notes (Unsigned)
 11/10/2023 Andrea [name] [id number] 08/11/1945  Referring provider: Teressa Harrie HERO, FNP Primary GI doctor: Dr. Federico  ASSESSMENT AND PLAN:  IDA following with hematology 10/21/2023  HGB 8.0 MCV 97.0 Platelets 364 10/21/2023 Iron 24 Ferritin 63 B12 3,014 Recent Labs    02/19/23 1439 03/06/23 1312 03/27/23 1303 04/07/23 1006 06/05/23 1031 08/04/23 1048 09/08/23 1335 09/08/23 1443 10/01/23 0846 10/21/23 1053  HGB 8.0* 9.1* 11.0* 10.3* 9.1* 9.0* 6.5* 6.8* 8.1* 8.0*  11/27/2022 EGD and colonoscopy for IDA and positive fecal occult blood EGD unremarkable Colonoscopy bowel prep adequate showed severe diverticula in sigmoid colon, significant luminal narrowing noted sigmoid colon precluding advancement of the scope could not transversed despite changing positions for any muscular hypertrophy noted no obvious mass seen procedure reported obtain outpatient CT colonic atrophy Intolerant to oral iron gets IV iron as needed with hematology July status post 2 PRBCs and IV Feraheme Appears virtual CT colonoscopy was never done 03/23/2023 patient did have CT chest abdomen pelvis without contrast which showed cholelithiasis without cholecystitis sigmoid diverticulosis without diverticulitis 2 low-density lesions in the spleen measuring 15 mm likely benign suggest nonemergent MRI 2 mm right middle lobe nodule given breast history consider follow-up CT 1 year  Patient Care Team: Teressa Harrie HERO, FNP as PCP - General (Family Medicine) [name], MD as Consulting Physician (Hematology and Oncology) [facility], [name], Jennifer SAUNDERS, MD as Consulting Physician (Cardiology) Vanderbilt Ned, MD as Consulting Physician (Oncology) Latisha Medford, MD as Consulting Physician (Obstetrics and Gynecology)  HISTORY OF PRESENT ILLNESS: 78 y.o. female with a past medical history listed below presents for evaluation of ***.   *** Discussed the use of AI scribe software for  clinical note transcription with the patient, who gave verbal consent to proceed.  History of Present Illness            She  reports that she has never smoked. She has never used smokeless tobacco. She reports current alcohol use. She reports that she does not use drugs.  RELEVANT GI HISTORY, IMAGING AND LABS: Results         Colonoscopy 11/27/22: Bowel prep was adequate.  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 Significant luminal narrowing  noted in the sigmoid colon precluding advancement of scope. Could not  traverse despite changing patient position. Surrounding muscular  hypertrophy noted. No obvious mass seen. Procedure was then aborted.  We will obtain outpatient CT colonography for further evaluation. If no  overt bleeding then rest of the workup for IDA can be done as outpatient    EGD 11/27/22: Findings:  The esophagus, stomach, duodenal bulb and 2nd part of the duodenum  appeared normal.  CBC    Component Value Date/Time   WBC 7.3 10/21/2023 1053   WBC 3.2 (L) 03/27/2023 1303   RBC 2.69 (L) 10/21/2023 1053   HGB 8.0 (L) 10/21/2023 1053   HGB 8.0 (L) 02/19/2023 1439   HCT 26.1 (L) 10/21/2023 1053   HCT 28.6 (L) 02/19/2023 1439   PLT 364 10/21/2023 1053   PLT 383 02/19/2023 1439   MCV 97.0 10/21/2023 1053   MCV 79 02/19/2023 1439   MCH 29.7 10/21/2023 1053   MCHC 30.7 10/21/2023 1053   RDW 19.0 (H) 10/21/2023 1053   RDW 25.2 (H) 02/19/2023 1439   LYMPHSABS 1.1 10/21/2023 1053   LYMPHSABS 1.6 02/19/2023 1439   MONOABS 0.7 10/21/2023 1053   EOSABS 0.2 10/21/2023 1053   EOSABS 0.2 02/19/2023 1439  BASOSABS 0.0 10/21/2023 1053   BASOSABS 0.1 02/19/2023 1439   Recent Labs    02/19/23 1439 03/06/23 1312 03/27/23 1303 04/07/23 1006 06/05/23 1031 08/04/23 1048 09/08/23 1335 09/08/23 1443 10/01/23 0846 10/21/23 1053  HGB 8.0* 9.1* 11.0* 10.3* 9.1* 9.0* 6.5* 6.8* 8.1* 8.0*    CMP     Component Value Date/Time   NA 139 09/08/2023 1335    NA 144 02/19/2023 1439   K 4.1 09/08/2023 1335   CL 104 09/08/2023 1335   CO2 23 09/08/2023 1335   GLUCOSE 104 (H) 09/08/2023 1335   BUN 23 09/08/2023 1335   BUN 27 02/19/2023 1439   CREATININE 1.00 09/08/2023 1335   CALCIUM 9.1 09/08/2023 1335   PROT 6.5 09/08/2023 1335   PROT 6.8 02/19/2023 1439   ALBUMIN 3.5 09/08/2023 1335   ALBUMIN 4.3 02/19/2023 1439   AST 16 09/08/2023 1335   ALT 8 09/08/2023 1335   ALKPHOS 89 09/08/2023 1335   BILITOT <0.2 09/08/2023 1335   GFRNONAA 57 (L) 09/08/2023 1335   GFRAA 79 12/07/2019 0914   GFRAA >60 07/08/2019 1150      Latest Ref Rng & Units 09/08/2023    1:35 PM 04/07/2023   10:06 AM 03/27/2023    1:03 PM  Hepatic Function  Total Protein 6.5 - 8.1 g/dL 6.5  6.7  7.0   Albumin 3.5 - 5.0 g/dL 3.5  3.9  3.5   AST 15 - 41 U/L 16  26  36   ALT 0 - 44 U/L 8  7  17    Alk Phosphatase 38 - 126 U/L 89  83  54   Total Bilirubin 0.0 - 1.2 mg/dL <9.7  0.3  0.6       Current Medications:    Current Outpatient Medications (Cardiovascular):    amLODipine  (NORVASC ) 5 MG tablet, Take 5 mg by mouth daily.   metoprolol  succinate (TOPROL -XL) 50 MG 24 hr tablet, Take 1 tablet (50 mg total) by mouth 2 (two) times daily.    Current Outpatient Medications (Hematological):    Ferrous Sulfate (IRON) 28 MG TABS, Take 1 tablet by mouth daily. (Patient not taking: Reported on 10/01/2023)   vitamin B-12 (CYANOCOBALAMIN ) 1000 MCG tablet, Take 1,000 mcg by mouth daily.   Current Outpatient Medications (Other):    Ascorbic Acid (VITAMIN C) 1000 MG tablet, Take 1,000 mg by mouth daily.    CRANBERRY PO, Take 8,400 mg by mouth daily.   Multiple Vitamins-Minerals (BONEUP PO), Take 1 tablet by mouth 2 (two) times daily.   Multiple Vitamins-Minerals (OCUVITE PO), Take 1 tablet by mouth daily.    Omega-3 1000 MG CAPS, Take 2,000 mg by mouth 2 (two) times daily.   omeprazole (PRILOSEC) 20 MG capsule, Take 20 mg by mouth daily.   Probiotic CAPS, Take 1 capsule by mouth  daily.    Specialty Vitamins Products (MENOPAUSE RELIEF PO), Take 731 mg by mouth daily.   zinc  gluconate 50 MG tablet, Take 50 mg by mouth daily.   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   Dyslipidemia 11/26/2022   Essential hypertension  12/28/2014   GERD (gastroesophageal reflux disease)    Headache    r/t sinus   Hearing loss 01/20/2022   History of cardiomyopathy 10/27/2017   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   Hyperlipemia 12/22/2016   Hyperlipidemia    Hypertension    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   11/2018-High grade-lumpectomy-radiation 02/2019-03/14/2019   Mixed hyperlipidemia 01/20/2022   Palpitations    Paroxysmal A-fib (HCC) 11/2022   Personal history of radiation therapy    Allergies:  Allergies  Allergen Reactions   Cortisone     States allegic to oral Cortisone, but not to shot     Surgical History:  She  has a past surgical history that includes Wrist surgery; Abdominal hysterectomy; Inner ear surgery; Rotator cuff repair (Right); Breast lumpectomy with radioactive seed localization (Left, 01/04/2019); Re-excision of breast lumpectomy (Left, 01/18/2019); and Breast lumpectomy. Family History:  Her family history includes Cancer in her paternal grandmother; Emphysema in her brother; Kidney failure in her father; Stroke in her mother; Valvular heart disease in her brother.  REVIEW OF SYSTEMS  : All other systems reviewed and negative except where noted in the History of Present Illness.  PHYSICAL EXAM: There were no vitals taken for this  visit. Physical Exam          Alan JONELLE Coombs, PA-C 8:27 AM

## 2023-11-11 ENCOUNTER — Telehealth: Payer: Self-pay

## 2023-11-11 ENCOUNTER — Other Ambulatory Visit (INDEPENDENT_AMBULATORY_CARE_PROVIDER_SITE_OTHER)

## 2023-11-11 ENCOUNTER — Encounter: Payer: Self-pay | Admitting: Physician Assistant

## 2023-11-11 ENCOUNTER — Ambulatory Visit: Payer: Self-pay | Admitting: Physician Assistant

## 2023-11-11 ENCOUNTER — Ambulatory Visit: Admitting: Physician Assistant

## 2023-11-11 VITALS — BP 120/62 | HR 104 | Ht 60.0 in | Wt 126.1 lb

## 2023-11-11 DIAGNOSIS — D509 Iron deficiency anemia, unspecified: Secondary | ICD-10-CM | POA: Diagnosis not present

## 2023-11-11 DIAGNOSIS — D7389 Other diseases of spleen: Secondary | ICD-10-CM

## 2023-11-11 DIAGNOSIS — K219 Gastro-esophageal reflux disease without esophagitis: Secondary | ICD-10-CM | POA: Diagnosis not present

## 2023-11-11 DIAGNOSIS — Z8711 Personal history of peptic ulcer disease: Secondary | ICD-10-CM

## 2023-11-11 DIAGNOSIS — R195 Other fecal abnormalities: Secondary | ICD-10-CM

## 2023-11-11 LAB — IBC + FERRITIN
Ferritin: 19 ng/mL (ref 10.0–291.0)
Iron: 11 ug/dL — ABNORMAL LOW (ref 42–145)
Saturation Ratios: 3.8 % — ABNORMAL LOW (ref 20.0–50.0)
TIBC: 287 ug/dL (ref 250.0–450.0)
Transferrin: 205 mg/dL — ABNORMAL LOW (ref 212.0–360.0)

## 2023-11-11 LAB — CBC WITH DIFFERENTIAL/PLATELET
Basophils Absolute: 0 K/uL (ref 0.0–0.1)
Basophils Relative: 0.5 % (ref 0.0–3.0)
Eosinophils Absolute: 0.1 K/uL (ref 0.0–0.7)
Eosinophils Relative: 1.4 % (ref 0.0–5.0)
HCT: 23.4 % — CL (ref 36.0–46.0)
Hemoglobin: 7.5 g/dL — CL (ref 12.0–15.0)
Lymphocytes Relative: 12.7 % (ref 12.0–46.0)
Lymphs Abs: 1.2 K/uL (ref 0.7–4.0)
MCHC: 32.2 g/dL (ref 30.0–36.0)
MCV: 87.9 fl (ref 78.0–100.0)
Monocytes Absolute: 0.9 K/uL (ref 0.1–1.0)
Monocytes Relative: 9.4 % (ref 3.0–12.0)
Neutro Abs: 6.9 K/uL (ref 1.4–7.7)
Neutrophils Relative %: 76 % (ref 43.0–77.0)
Platelets: 452 K/uL — ABNORMAL HIGH (ref 150.0–400.0)
RBC: 2.66 Mil/uL — ABNORMAL LOW (ref 3.87–5.11)
RDW: 19.6 % — ABNORMAL HIGH (ref 11.5–15.5)
WBC: 9.1 K/uL (ref 4.0–10.5)

## 2023-11-11 LAB — COMPREHENSIVE METABOLIC PANEL WITH GFR
ALT: 6 U/L (ref 0–35)
AST: 11 U/L (ref 0–37)
Albumin: 3.5 g/dL (ref 3.5–5.2)
Alkaline Phosphatase: 63 U/L (ref 39–117)
BUN: 24 mg/dL — ABNORMAL HIGH (ref 6–23)
CO2: 26 meq/L (ref 19–32)
Calcium: 9.2 mg/dL (ref 8.4–10.5)
Chloride: 104 meq/L (ref 96–112)
Creatinine, Ser: 0.91 mg/dL (ref 0.40–1.20)
GFR: 59.77 mL/min — ABNORMAL LOW (ref >60.00)
Glucose, Bld: 106 mg/dL — ABNORMAL HIGH (ref 70–99)
Potassium: 4 meq/L (ref 3.5–5.1)
Sodium: 140 meq/L (ref 135–145)
Total Bilirubin: 0.2 mg/dL (ref 0.2–1.2)
Total Protein: 6.8 g/dL (ref 6.0–8.3)

## 2023-11-11 NOTE — Telephone Encounter (Signed)
-----   Message from Alan JONELLE Coombs sent at 11/11/2023 11:53 AM EDT ----- Can we schedule colonoscopy with someone in the pod with ultraslim scope and schedule for virtual diagnostic colonoscopy day of or day after colonoscopy to avoid prepping twice? Let me know who it is with and I will send them patient note.  Thanks, Alan

## 2023-11-11 NOTE — Telephone Encounter (Signed)
 Left message for patient to return my call.

## 2023-11-11 NOTE — Telephone Encounter (Signed)
 Received critical lab Hgb 7.5, 23.4 Hct

## 2023-11-11 NOTE — Progress Notes (Signed)
 I agree with the assessment and plan as outlined by Ms. Craig.

## 2023-11-11 NOTE — Patient Instructions (Addendum)
 Your provider has requested that you go to the basement level for lab work before leaving today. Press B on the elevator. The lab is located at the first door on the left as you exit the elevator.  Advised to go to the ER if there is any severe weakness, severe abdominal pain, vomit blood, dark red blood in your bowel movement, shortness of breath or chest pain.    VISIT SUMMARY:  Today, you were seen for evaluation of gastrointestinal bleeding and anemia. We discussed your history of low hemoglobin levels and previous treatments, including blood transfusions and iron supplements. We also reviewed your past colonoscopies and current symptoms.  YOUR PLAN:  -IRON DEFICIENCY ANEMIA WITH GASTROINTESTINAL BLEEDING, SOURCE UNDETERMINED: Iron deficiency anemia means you have a lower than normal number of red blood cells due to a lack of iron, which is essential for making hemoglobin. Your hemoglobin level has improved to 8 g/dL, but you still have signs of bleeding. We will discuss with Dr. Federico the possibility of retrying a colonoscopy. If a traditional colonoscopy is not feasible, we may consider a virtual colonoscopy or a capsule endoscopy. It's important to keep your stool soft to prevent constipation.  -DIVERTICULOSIS OF COLON WITH COLONIC STRICTURE: Diverticulosis is a condition where small pouches form in the colon wall, and a colonic stricture is a narrowing of the colon. This has prevented a complete colonoscopy in the past. We will discuss with Dr. Federico the feasibility of retrying a colonoscopy. If a traditional colonoscopy is not feasible, we may consider a virtual colonoscopy. If the narrowing persists and causes symptoms, a surgical consultation may be necessary.  INSTRUCTIONS:  We will follow up with Dr. Federico to discuss the possibility of retrying a colonoscopy. If a traditional colonoscopy is not feasible, we may consider a virtual colonoscopy or a capsule endoscopy. Please ensure  your stool remains soft to prevent constipation.

## 2023-11-12 NOTE — Telephone Encounter (Signed)
 See result note from 11/11/23

## 2023-11-13 ENCOUNTER — Encounter (HOSPITAL_COMMUNITY)
Admission: RE | Admit: 2023-11-13 | Discharge: 2023-11-13 | Disposition: A | Discharge status: Home / Self Care | Source: Ambulatory Visit | Attending: Physician Assistant | Admitting: Physician Assistant

## 2023-11-13 ENCOUNTER — Ambulatory Visit: Payer: Self-pay | Admitting: Physician Assistant

## 2023-11-13 DIAGNOSIS — R195 Other fecal abnormalities: Secondary | ICD-10-CM | POA: Diagnosis not present

## 2023-11-13 DIAGNOSIS — D509 Iron deficiency anemia, unspecified: Secondary | ICD-10-CM | POA: Insufficient documentation

## 2023-11-13 MED ORDER — TECHNETIUM TC 99M-LABELED RED BLOOD CELLS IV KIT
20.0000 | PACK | Freq: Once | INTRAVENOUS | Status: AC | PRN
  Administered 2023-11-13: 20 via INTRAVENOUS

## 2023-11-18 ENCOUNTER — Other Ambulatory Visit: Payer: Self-pay | Admitting: Hematology and Oncology

## 2023-11-18 ENCOUNTER — Inpatient Hospital Stay (HOSPITAL_BASED_OUTPATIENT_CLINIC_OR_DEPARTMENT_OTHER): Admitting: Hematology and Oncology

## 2023-11-18 ENCOUNTER — Inpatient Hospital Stay: Attending: Hematology and Oncology

## 2023-11-18 ENCOUNTER — Other Ambulatory Visit: Payer: Self-pay

## 2023-11-18 ENCOUNTER — Telehealth: Payer: Self-pay

## 2023-11-18 VITALS — BP 114/67 | HR 89 | Resp 14 | Ht 60.0 in | Wt 126.5 lb

## 2023-11-18 DIAGNOSIS — D500 Iron deficiency anemia secondary to blood loss (chronic): Secondary | ICD-10-CM

## 2023-11-18 DIAGNOSIS — D509 Iron deficiency anemia, unspecified: Secondary | ICD-10-CM | POA: Diagnosis present

## 2023-11-18 LAB — CBC WITH DIFFERENTIAL (CANCER CENTER ONLY)
Abs Immature Granulocytes: 0.02 K/uL (ref 0.00–0.07)
Basophils Absolute: 0 K/uL (ref 0.0–0.1)
Basophils Relative: 0 %
Eosinophils Absolute: 0.1 K/uL (ref 0.0–0.5)
Eosinophils Relative: 1 %
HCT: 22.1 % — ABNORMAL LOW (ref 36.0–46.0)
Hemoglobin: 6.6 g/dL — CL (ref 12.0–15.0)
Immature Granulocytes: 0 %
Lymphocytes Relative: 13 %
Lymphs Abs: 1.2 K/uL (ref 0.7–4.0)
MCH: 26.5 pg (ref 26.0–34.0)
MCHC: 29.9 g/dL — ABNORMAL LOW (ref 30.0–36.0)
MCV: 88.8 fL (ref 80.0–100.0)
Monocytes Absolute: 0.8 K/uL (ref 0.1–1.0)
Monocytes Relative: 10 %
Neutro Abs: 6.7 K/uL (ref 1.7–7.7)
Neutrophils Relative %: 76 %
Platelet Count: 395 K/uL (ref 150–400)
RBC: 2.49 MIL/uL — ABNORMAL LOW (ref 3.87–5.11)
RDW: 18.1 % — ABNORMAL HIGH (ref 11.5–15.5)
WBC Count: 8.8 K/uL (ref 4.0–10.5)
nRBC: 0 % (ref 0.0–0.2)

## 2023-11-18 LAB — CMP (CANCER CENTER ONLY)
ALT: 6 U/L (ref 0–44)
AST: 15 U/L (ref 15–41)
Albumin: 3.3 g/dL — ABNORMAL LOW (ref 3.5–5.0)
Alkaline Phosphatase: 78 U/L (ref 38–126)
Anion gap: 12 (ref 5–15)
BUN: 24 mg/dL — ABNORMAL HIGH (ref 8–23)
CO2: 23 mmol/L (ref 22–32)
Calcium: 9.1 mg/dL (ref 8.9–10.3)
Chloride: 105 mmol/L (ref 98–111)
Creatinine: 1.02 mg/dL — ABNORMAL HIGH (ref 0.44–1.00)
GFR, Estimated: 55 mL/min — ABNORMAL LOW (ref >60)
Glucose, Bld: 106 mg/dL — ABNORMAL HIGH (ref 70–99)
Potassium: 4.1 mmol/L (ref 3.5–5.1)
Sodium: 140 mmol/L (ref 135–145)
Total Bilirubin: <0.2 mg/dL (ref 0.0–1.2)
Total Protein: 6.6 g/dL (ref 6.5–8.1)

## 2023-11-18 LAB — IRON AND TIBC
Iron: 14 ug/dL — ABNORMAL LOW (ref 28–170)
Saturation Ratios: 5 % — ABNORMAL LOW (ref 10.4–31.8)
TIBC: 294 ug/dL (ref 250–450)
UIBC: 280 ug/dL

## 2023-11-18 LAB — PREPARE RBC (CROSSMATCH)

## 2023-11-18 LAB — FERRITIN: Ferritin: 10 ng/mL — ABNORMAL LOW (ref 11–307)

## 2023-11-18 NOTE — Telephone Encounter (Signed)
 CRITICAL VALUE STICKER  CRITICAL VALUE: HGB 6.6   RECEIVER (on-site recipient of call): JON, LPN  DATE & TIME NOTIFIED: 11/18/23 1112  MESSENGER (representative from lab): JAY AT [facility]  MD NOTIFIED: MELISSA, FNP  TIME OF NOTIFICATION: 1114  RESPONSE:  WILL SEE PATIENT NEXT.

## 2023-11-18 NOTE — Progress Notes (Signed)
 [name] [address] [place],  [place]  [address] [phone]  Clinic Day:  11/18/2023   Referring physician: Teressa Harrie HERO, FNP  Patient Care Team: Patient Care Team: Teressa Harrie HERO, FNP as PCP - General (Family Medicine) Andrea Potts, MD as Consulting Physician (Hematology and Oncology) [name], [name], Jennifer SAUNDERS, MD as Consulting Physician (Cardiology) Vanderbilt Ned, MD as Consulting Physician (Oncology) Latisha Medford, MD as Consulting Physician (Obstetrics and Gynecology)   REASON FOR CONSULTATION:  Anemia  HISTORY OF PRESENT ILLNESS:  The patient is a 78 y.o. female with iron deficiency anemia felt to be due to chronic GI blood loss. She did not tolerate oral iron supplement due to abdominal pain.  She has received IV iron in the form of Feraheme as needed.  Her hemoglobin dropped from 6.8 in July.  She was transfused 2 units of packed red blood cells, then received IV Feraheme again.  She is here today for repeat clinical assessment. Colonoscopy was attempted but unable to be completed. She is scheduled for repeat assessment with [name] with a smaller scope to attempt colonoscopy.   REVIEW OF SYSTEMS:   Review of Systems  Constitutional:  Positive for fatigue.  HENT:  Negative.    Eyes: Negative.   Respiratory:  Positive for shortness of breath.   Cardiovascular: Negative.   Gastrointestinal: Negative.   Endocrine: Negative.   Genitourinary: Negative.    Musculoskeletal: Negative.   Skin: Negative.   Neurological: Negative.   Hematological: Negative.   Psychiatric/Behavioral: Negative.       VITALS:   There were no vitals taken for this visit.  Wt Readings from Last 3 Encounters:  11/11/23 126 lb 2 oz (57.2 kg)  10/21/23 130 lb 8 oz (59.2 kg)  10/01/23 130 lb 1.6 oz (59 kg)    There is no height or weight on file to calculate BMI.  Performance status (ECOG): 1 - Symptomatic but completely  ambulatory  PHYSICAL EXAM:   Physical Exam Constitutional:      Appearance: Normal appearance. She is normal weight.  HENT:     Head: Normocephalic and atraumatic.     Mouth/Throat:     Mouth: Mucous membranes are moist.  Cardiovascular:     Rate and Rhythm: Normal rate and regular rhythm.     Pulses: Normal pulses.     Heart sounds: Normal heart sounds.  Pulmonary:     Effort: Pulmonary effort is normal.     Breath sounds: Normal breath sounds.  Abdominal:     General: Bowel sounds are normal.     Palpations: Abdomen is soft.  Musculoskeletal:        General: Normal range of motion.  Skin:    General: Skin is warm and dry.  Neurological:     General: No focal deficit present.     Mental Status: She is alert and oriented to person, place, and time. Mental status is at baseline.  Psychiatric:        Mood and Affect: Mood normal.        Behavior: Behavior normal.        Thought Content: Thought content normal.        Judgment: Judgment normal.      LABS:      Latest Ref Rng & Units 11/11/2023   12:07 PM 10/21/2023   10:53 AM 10/01/2023    8:46 AM  CBC  WBC 4.0 - 10.5 K/uL 9.1  7.3  5.9   Hemoglobin 12.0 - 15.0 g/dL 7.5 Repeated and verified X2.  8.0  8.1   Hematocrit 36.0 - 46.0 % 23.4 Repeated and verified X2.  26.1  26.9   Platelets 150.0 - 400.0 K/uL 452.0  364  316       Latest Ref Rng & Units 11/11/2023   12:07 PM 09/08/2023    1:35 PM 04/07/2023   10:06 AM  CMP  Glucose 70 - 99 mg/dL 893  895  877   BUN 6 - 23 mg/dL 24  23  19    Creatinine 0.40 - 1.20 mg/dL 9.08  8.99  9.15   Sodium 135 - 145 mEq/L 140  139  143   Potassium 3.5 - 5.1 mEq/L 4.0  4.1  4.0   Chloride 96 - 112 mEq/L 104  104  108   CO2 19 - 32 mEq/L 26  23  24    Calcium 8.4 - 10.5 mg/dL 9.2  9.1  9.3   Total Protein 6.0 - 8.3 g/dL 6.8  6.5  6.7   Total Bilirubin 0.2 - 1.2 mg/dL 0.2  <9.7  0.3   Alkaline Phos 39 - 117 U/L 63  89  83   AST 0 - 37 U/L 11  16  26    ALT 0 - 35 U/L 6  8  7        No results found for: CEA1, CEA / No results found for: CEA1, CEA No results found for: PSA1 No results found for: CAN199 No results found for: RJW874  Lab Results  Component Value Date   TOTALPROTELP 6.7 03/06/2023   ALBUMINELP 3.6 03/06/2023   A1GS 0.3 03/06/2023   A2GS 0.8 03/06/2023   BETS 1.2 03/06/2023   GAMS 0.9 03/06/2023   MSPIKE Not Observed 03/06/2023   Lab Results  Component Value Date   TIBC 287.0 11/11/2023   TIBC 235 (L) 10/21/2023   TIBC 346 09/08/2023   FERRITIN 19.0 11/11/2023   FERRITIN 63 10/21/2023   FERRITIN 7 (L) 09/08/2023   IRONPCTSAT 3.8 (L) 11/11/2023   IRONPCTSAT 10 (L) 10/21/2023   IRONPCTSAT 5 (L) 09/08/2023   Lab Results  Component Value Date   LDH 179 03/06/2023    STUDIES:   NM GI Blood Loss Result Date: 11/13/2023 CLINICAL DATA:  Reduced hemoglobin.  Positive Hemoccult. EXAM: NUCLEAR MEDICINE GASTROINTESTINAL BLEEDING SCAN TECHNIQUE: Sequential abdominal images were obtained following intravenous administration of Tc-90m labeled red blood cells. RADIOPHARMACEUTICALS:  Twenty mCi Tc-33m pertechnetate in-vitro labeled red cells. COMPARISON:  CT scan 03/27/2023 FINDINGS: Satisfactory distribution of radiotracer. Overt 2 hours of observation, no abnormal accumulation of radiotracer conforming to bowel is identified to indicate active GI bleeding. Progressive bladder activity, likely due to incidental free pertechnetate excretion into the bladder. IMPRESSION: 1. No findings of active GI bleeding during the two hour observation period. Electronically Signed   By: Ryan Salvage M.D.   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   Dyslipidemia 11/26/2022   Essential hypertension 12/28/2014   GERD (gastroesophageal reflux disease)  Headache    r/t sinus   Hearing loss 01/20/2022   History of cardiomyopathy 10/27/2017   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   Hyperlipemia 12/22/2016   Hyperlipidemia    Hypertension    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   11/2018-High grade-lumpectomy-radiation 02/2019-03/14/2019   Mixed hyperlipidemia 01/20/2022   Palpitations    Paroxysmal A-fib (HCC) 11/2022   Personal history of radiation therapy     Past Surgical History:  Procedure Laterality Date   ABDOMINAL HYSTERECTOMY     with reconstructive surgery   BREAST LUMPECTOMY Left    BREAST LUMPECTOMY WITH RADIOACTIVE SEED LOCALIZATION Left 01/04/2019   Procedure: LEFT BREAST LUMPECTOMY WITH RADIOACTIVE SEED LOCALIZATION;  Surgeon: Vanderbilt Ned, MD;  Location: [facility];  Service: General;  Laterality: Left;   INNER EAR SURGERY Left    RE-EXCISION OF BREAST LUMPECTOMY Left 01/18/2019   Procedure: RE-EXCISION OF LEFT BREAST LUMPECTOMY;  Surgeon: Vanderbilt Ned, MD;  Location: [facility];  Service: General;  Laterality: Left;   ROTATOR CUFF REPAIR Right    WRIST SURGERY Right    cyst removal    Family History  Problem Relation Age of Onset   Stroke Mother    Hypertension Mother    Kidney failure Father        Neuremia kidney poisoning   Valvular heart disease Brother    Emphysema Brother    Hearing loss Brother        hearing aids   Tuberculosis Maternal Grandmother    Alcohol abuse Maternal Grandfather        phlebitis   Throat cancer Paternal Grandmother     Social History:  reports that she has never smoked. She has never used smokeless tobacco. She reports current alcohol use. She reports that she does not use drugs.The patient is  alone  today.  Allergies:  Allergies  Allergen Reactions   Cortisone     States allegic to oral Cortisone, but not to shot   Ferrous Sulfate Other (See Comments)    Abdominal pains    Current Medications: Current Outpatient Medications  Medication Sig Dispense Refill   amLODipine  (NORVASC ) 5 MG tablet Take 5 mg by mouth daily.     Ascorbic Acid (VITAMIN C) 1000 MG tablet Take 1,000 mg by mouth daily.      CRANBERRY PO Take 8,400 mg by mouth daily.     Garlic (GARLIQUE PO) Take 1 tablet by mouth daily. For cholesterol     metoprolol  succinate (TOPROL -XL) 50 MG 24 hr tablet Take 1 tablet (50 mg total) by mouth 2 (two) times daily. 180 tablet 1   Multiple Vitamins-Minerals (BONEUP PO) Take 1 tablet by mouth 2 (two) times daily.     Multiple Vitamins-Minerals (OCUVITE PO) Take 1 tablet by mouth daily.      Omega-3 1000 MG CAPS Take 2,000 mg by mouth 2 (two) times daily.     omeprazole (PRILOSEC) 20 MG capsule Take 20 mg by mouth daily.     Probiotic CAPS Take 1 capsule by mouth daily.      Specialty Vitamins Products (MENOPAUSE RELIEF PO) Take 731 mg by mouth daily.     vitamin B-12 (CYANOCOBALAMIN ) 1000 MCG tablet Take 1,000 mcg by mouth daily.      zinc  gluconate 50 MG tablet Take 50 mg by mouth daily.      No current facility-administered medications for this visit.     ASSESSMENT &  PLAN:   Assessment:  Andrea [name] is a 78 y.o. female with history of anemia having received multiple blood transfusions as well as iron presents today for repeat evaluation. She had a colonoscopy attempted by [facility]; however, it was not completed due to inability to pass the scope. She will have a second attempt. Today her hemoglobin has once again dropped and is 6.6. We will plan for transfusion of 2 units of PRBC this week in hopes of increasing her hemoglobin for her procedure. Iron studies are pending. If these are again decreased, we will add iron infusion as well.  Plan: 1.  Return to  clinic on Friday 09/19 for 2 units PRBC and possible iron in preparation for upcoming colonoscopy.   I discussed the assessment and treatment plan with the patient.  The patient was provided an opportunity to ask questions and all were answered.  The patient agreed with the plan and demonstrated an understanding of the instructions.    Thank you for the referral    20 minutes was spent in patient care.  This included time spent preparing to see the patient (e.g., review of tests), obtaining and/or reviewing separately obtained history, counseling and educating the patient/family/caregiver, ordering medications, tests, or procedures; documenting clinical information in the electronic or other health record, independently interpreting results and communicating results to the patient/family/caregiver as well as coordination of care.      Eleanor DELENA Bach, NP   Family Nurse Practitioner - Board Certified [facility] [place] [phone]

## 2023-11-19 LAB — SOLUBLE TRANSFERRIN RECEPTOR: Transferrin Receptor: 42.7 nmol/L — ABNORMAL HIGH (ref 12.2–27.3)

## 2023-11-20 ENCOUNTER — Inpatient Hospital Stay

## 2023-11-20 DIAGNOSIS — D500 Iron deficiency anemia secondary to blood loss (chronic): Secondary | ICD-10-CM

## 2023-11-20 DIAGNOSIS — D509 Iron deficiency anemia, unspecified: Secondary | ICD-10-CM | POA: Diagnosis not present

## 2023-11-20 MED ORDER — DIPHENHYDRAMINE HCL 25 MG PO CAPS
25.0000 mg | ORAL_CAPSULE | Freq: Once | ORAL | Status: AC
  Administered 2023-11-20: 25 mg via ORAL
  Filled 2023-11-20: qty 1

## 2023-11-20 MED ORDER — SODIUM CHLORIDE 0.9% IV SOLUTION
250.0000 mL | INTRAVENOUS | Status: DC
  Administered 2023-11-20: 250 mL via INTRAVENOUS

## 2023-11-20 MED ORDER — ACETAMINOPHEN 325 MG PO TABS
650.0000 mg | ORAL_TABLET | Freq: Once | ORAL | Status: AC
  Administered 2023-11-20: 650 mg via ORAL
  Filled 2023-11-20: qty 2

## 2023-11-20 NOTE — Patient Instructions (Signed)

## 2023-11-23 ENCOUNTER — Inpatient Hospital Stay

## 2023-11-23 VITALS — BP 134/66 | HR 75 | Temp 98.1°F | Resp 18

## 2023-11-23 DIAGNOSIS — D500 Iron deficiency anemia secondary to blood loss (chronic): Secondary | ICD-10-CM

## 2023-11-23 DIAGNOSIS — D509 Iron deficiency anemia, unspecified: Secondary | ICD-10-CM | POA: Diagnosis not present

## 2023-11-23 LAB — BPAM RBC
Blood Product Expiration Date: 202510152359
Blood Product Expiration Date: 202510182359
ISSUE DATE / TIME: 202509190741
ISSUE DATE / TIME: 202509190741
Unit Type and Rh: 5100
Unit Type and Rh: 5100

## 2023-11-23 LAB — TYPE AND SCREEN
ABO/RH(D): O POS
Antibody Screen: NEGATIVE
Unit division: 0
Unit division: 0

## 2023-11-23 MED ORDER — LORATADINE 10 MG PO TABS
10.0000 mg | ORAL_TABLET | Freq: Once | ORAL | Status: AC
  Administered 2023-11-23: 10 mg via ORAL
  Filled 2023-11-23: qty 1

## 2023-11-23 MED ORDER — SODIUM CHLORIDE 0.9 % IV SOLN: INTRAVENOUS | Status: DC

## 2023-11-23 MED ORDER — ACETAMINOPHEN 325 MG PO TABS
650.0000 mg | ORAL_TABLET | Freq: Once | ORAL | Status: AC
  Administered 2023-11-23: 650 mg via ORAL
  Filled 2023-11-23: qty 2

## 2023-11-23 MED ORDER — SODIUM CHLORIDE 0.9 % IV SOLN
1000.0000 mg | Freq: Once | INTRAVENOUS | Status: AC
  Administered 2023-11-23: 1000 mg via INTRAVENOUS
  Filled 2023-11-23: qty 10

## 2023-11-23 NOTE — Patient Instructions (Signed)

## 2023-11-27 ENCOUNTER — Other Ambulatory Visit: Payer: Self-pay

## 2023-11-27 ENCOUNTER — Telehealth: Payer: Self-pay

## 2023-11-27 ENCOUNTER — Other Ambulatory Visit: Payer: Self-pay | Admitting: Cardiology

## 2023-11-27 DIAGNOSIS — R195 Other fecal abnormalities: Secondary | ICD-10-CM

## 2023-11-27 DIAGNOSIS — R933 Abnormal findings on diagnostic imaging of other parts of digestive tract: Secondary | ICD-10-CM

## 2023-11-27 MED ORDER — NA SULFATE-K SULFATE-MG SULF 17.5-3.13-1.6 GM/177ML PO SOLN: 1.0000 | Freq: Once | ORAL | 0 refills | Status: AC

## 2023-11-27 NOTE — Telephone Encounter (Signed)
 Contacted patient and went over [facility] recommendations. Patient agreed. Patient states the best days to schedule these tests are on Monday, Wednesday and Fridays. Also, patient will be out of town 10/16-10/21.  Scheduled patient for CT virtual colon at [facility] ([address]) on 01/05/24 at 10:00am, arriving at 9:40 am and colonoscopy with Dr. Suzann on 01/04/24 at 9:00 am. [facility] is aware patient will be doing full bowel prep and will not be picking up there kit. Patient will have to remain on clear liquids after possible incomplete colonoscopy until the next day for CT virtual colon. Patient is aware of both appts. Bowel prep sent to patient's pharmacy. Patient would like instructions be mailed to her for colonoscopy. Informed patient that I want her to contact me with any questions regarding the colonoscopy or CT virtual colonoscopy. Patient agreed and verbalized understanding.  Will contact patient to have her come in to the basement lab 1-2 days prior to colonoscopy.

## 2023-11-27 NOTE — Telephone Encounter (Signed)
-----   Message from Alan JONELLE Coombs sent at 11/27/2023 10:04 AM EDT ----- Patient called my cell phone and left a message saying she is returning my call, I did not call her anytime recently so I am assuming this is in regarding to her colonoscopy.  If we can try and please set her up for a colonoscopy with a scheduled virtual colonoscopy day of or day after coordinated with radiology so she does not have to prep again would be the best.  I would set up the colonoscopy and then scheduled the virtual colonoscopy afterwards day of day after if possible I can also discuss with radiologist if needed but I done this before.  I would also suggest making sure we do a CBC 1 to 2 days before colonoscopy to assure her hemoglobin is above 7.  Thanks, Alan

## 2023-12-04 ENCOUNTER — Other Ambulatory Visit: Payer: Self-pay | Admitting: Cardiology

## 2023-12-17 DIAGNOSIS — Z23 Encounter for immunization: Secondary | ICD-10-CM | POA: Diagnosis not present

## 2023-12-22 ENCOUNTER — Other Ambulatory Visit: Payer: Self-pay | Admitting: Cardiology

## 2023-12-28 ENCOUNTER — Telehealth: Payer: Self-pay

## 2023-12-28 NOTE — Telephone Encounter (Signed)
-----   Message from [facility] Alan M sent at 11/27/2023  3:09 PM EDT ----- Patient is scheduled for colonoscopy with Dr. Suzann on 01/04/24. If colon is incomplete then patient is to have CT virtual colon next day (01/05/24) at 10:00am. Contact patient and have her come in for repeat CBC 1-2 days prior to colonoscopy to insure her Hgb is above 7.

## 2023-12-28 NOTE — Telephone Encounter (Signed)
 Called patient no answer and voicemail box is full. Could not leave a message.

## 2023-12-29 NOTE — Telephone Encounter (Signed)
 Called patient with no answer and the voicemail box is full. Could not leave a message.

## 2023-12-30 ENCOUNTER — Other Ambulatory Visit: Payer: Self-pay

## 2023-12-30 DIAGNOSIS — D509 Iron deficiency anemia, unspecified: Secondary | ICD-10-CM

## 2023-12-30 DIAGNOSIS — R195 Other fecal abnormalities: Secondary | ICD-10-CM

## 2023-12-30 NOTE — Telephone Encounter (Signed)
 Spoke with patient and informed her to come to our basement lab for a blood count on Friday. Patient agreed and verbalized understanding.

## 2024-01-01 ENCOUNTER — Other Ambulatory Visit

## 2024-01-01 ENCOUNTER — Ambulatory Visit: Payer: Self-pay | Admitting: Physician Assistant

## 2024-01-01 DIAGNOSIS — D509 Iron deficiency anemia, unspecified: Secondary | ICD-10-CM

## 2024-01-01 DIAGNOSIS — R195 Other fecal abnormalities: Secondary | ICD-10-CM

## 2024-01-01 LAB — CBC WITH DIFFERENTIAL/PLATELET
Basophils Absolute: 0 K/uL (ref 0.0–0.1)
Basophils Relative: 0.5 % (ref 0.0–3.0)
Eosinophils Absolute: 0.1 K/uL (ref 0.0–0.7)
Eosinophils Relative: 2.2 % (ref 0.0–5.0)
HCT: 25.3 % — ABNORMAL LOW (ref 36.0–46.0)
Hemoglobin: 8.4 g/dL — ABNORMAL LOW (ref 12.0–15.0)
Lymphocytes Relative: 15.2 % (ref 12.0–46.0)
Lymphs Abs: 1 K/uL (ref 0.7–4.0)
MCHC: 33.3 g/dL (ref 30.0–36.0)
MCV: 93.5 fl (ref 78.0–100.0)
Monocytes Absolute: 0.7 K/uL (ref 0.1–1.0)
Monocytes Relative: 9.8 % (ref 3.0–12.0)
Neutro Abs: 4.9 K/uL (ref 1.4–7.7)
Neutrophils Relative %: 72.3 % (ref 43.0–77.0)
Platelets: 499 K/uL — ABNORMAL HIGH (ref 150.0–400.0)
RBC: 2.71 Mil/uL — ABNORMAL LOW (ref 3.87–5.11)
RDW: 21.1 % — ABNORMAL HIGH (ref 11.5–15.5)
WBC: 6.7 K/uL (ref 4.0–10.5)

## 2024-01-03 NOTE — Progress Notes (Unsigned)
 [facility] History and Physical   Primary Care Physician:  Teressa Harrie HERO, FNP   Reason for Procedure:  Iron deficiency anemia, fecal occult blood positive  Plan:    Colonoscopy     HPI: Andrea [name] is a 78 y.o. female undergoing colonoscopy for investigation of iron deficiency anemia and fecal occult blood positive stool.  Patient has reportedly had normal colonoscopies in the remote past in [place] age [age] years.  Colonoscopy was attempted at [facility] in 2024 for investigation of IDA.  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  Colonoscope could not be advanced despite changing patient decision.  Surrounding muscular hypertrophy noted.  There was discussion about performing a CT colonoscopy at that time but this was not completed.  Patient continues to have iron deficiency anemia.  Hemoglobin declined to 6.6 in September 2025 requiring transfusion.  Most recent hemoglobin 01/01/2024 8.4.  No family history of colorectal cancer or polyps.  Virtual CT colonoscopy is scheduled for 01/05/2024 in the event that colonoscopy is incomplete again today.  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   Hyperlipemia 12/22/2016   Hyperlipidemia     Hypertension    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   11/2018-High grade-lumpectomy-radiation 02/2019-03/14/2019   Mixed hyperlipidemia 01/20/2022   Palpitations    Paroxysmal A-fib (HCC) 11/2022   Personal history of radiation therapy     Past Surgical History:  Procedure Laterality Date   ABDOMINAL HYSTERECTOMY     with reconstructive surgery   BREAST LUMPECTOMY Left    BREAST LUMPECTOMY WITH RADIOACTIVE SEED LOCALIZATION Left 01/04/2019   Procedure: LEFT BREAST LUMPECTOMY WITH RADIOACTIVE SEED LOCALIZATION;  Surgeon: Vanderbilt Ned, MD;  Location: [facility];  Service: General;  Laterality: Left;   INNER EAR SURGERY Left    RE-EXCISION OF BREAST LUMPECTOMY Left 01/18/2019   Procedure: RE-EXCISION OF LEFT BREAST LUMPECTOMY;  Surgeon: Vanderbilt Ned, MD;  Location: [facility];  Service: General;  Laterality: Left;   ROTATOR CUFF REPAIR Right    WRIST SURGERY Right    cyst removal    Prior to Admission medications   Medication Sig Start Date End Date Taking? Authorizing Provider  amLODipine  (NORVASC ) 5 MG tablet Take 5 mg by mouth daily. 03/21/23   [provider]  Ascorbic Acid (VITAMIN C) 1000 MG tablet Take 1,000 mg by mouth daily.     [provider]  CRANBERRY PO Take 8,400 mg by mouth daily.    [provider]  Garlic (GARLIQUE PO) Take 1 tablet by mouth daily. For cholesterol    [provider]  metoprolol  succinate (TOPROL -XL) 50 MG 24 hr tablet TAKE 1 TABLET(50 MG) BY MOUTH TWICE DAILY 11/30/23   Revankar,  Jennifer SAUNDERS, MD  Multiple Vitamins-Minerals (BONEUP PO) Take 1 tablet by mouth 2 (two) times daily.    [provider]  Multiple Vitamins-Minerals (OCUVITE PO) Take 1 tablet by mouth daily.     [provider]  Omega-3 1000 MG CAPS Take 2,000 mg by mouth 2 (two) times daily.    [provider]  omeprazole (PRILOSEC) 20 MG capsule Take 20 mg by mouth daily.    [provider]  Probiotic CAPS Take 1 capsule by mouth daily.     [provider]  Specialty Vitamins Products (MENOPAUSE RELIEF PO) Take 731 mg by mouth daily.    [provider]  vitamin B-12 (CYANOCOBALAMIN ) 1000 MCG tablet Take 1,000 mcg by mouth daily.     [provider]  zinc  gluconate 50 MG tablet Take 50 mg by mouth daily.     [provider]    Current Outpatient Medications  Medication Sig Dispense Refill   amLODipine  (NORVASC ) 5 MG tablet Take 5 mg by mouth daily.     Ascorbic Acid (VITAMIN C) 1000 MG tablet Take 1,000 mg by mouth daily.      CRANBERRY PO Take 8,400 mg by mouth daily.     Garlic (GARLIQUE PO) Take 1 tablet by mouth daily. For cholesterol     metoprolol  succinate (TOPROL -XL) 50 MG 24 hr tablet TAKE 1 TABLET(50 MG) BY MOUTH TWICE DAILY 180 tablet 0   Multiple Vitamins-Minerals (BONEUP PO) Take 1 tablet by mouth 2 (two) times daily.     Multiple Vitamins-Minerals (OCUVITE PO) Take 1 tablet by mouth daily.      Omega-3 1000 MG CAPS Take 2,000 mg by mouth 2 (two) times daily.     omeprazole (PRILOSEC) 20 MG capsule Take 20 mg by mouth daily.     Probiotic CAPS Take 1 capsule by mouth daily.      Specialty Vitamins Products (MENOPAUSE RELIEF PO) Take 731 mg by mouth daily.     vitamin B-12 (CYANOCOBALAMIN ) 1000 MCG tablet Take 1,000 mcg by mouth daily.      zinc  gluconate 50 MG tablet Take 50 mg by mouth daily.      No current facility-administered medications for this visit.    Allergies as of 01/04/2024 - Review Complete 11/20/2023  Allergen Reaction Noted   Cortisone  12/13/2014   Ferrous sulfate Other (See Comments) 11/11/2023    Family History  Problem Relation Age of Onset   Stroke Mother    Hypertension Mother    Kidney failure Father        Neuremia kidney poisoning   Valvular heart disease Brother    Emphysema Brother    Hearing loss Brother        hearing aids   Tuberculosis Maternal Grandmother    Alcohol  abuse Maternal Grandfather        phlebitis   Throat cancer Paternal Grandmother     Social History   Socioeconomic History   Marital status: Married    Spouse name: Carlin Null   Number of children: 0   Years of education: Not on file   Highest education level: Some college, no degree  Occupational History   Occupation: retired  Tobacco Use   Smoking status: Never   Smokeless tobacco: Never  Vaping Use   Vaping status: Never Used  Substance and Sexual Activity   Alcohol use: Yes    Comment: rare   Drug use: No   Sexual activity: Not on file  Other Topics Concern   Not on file  Social History Narrative   Not on file   Social Drivers of Health   Financial Resource Strain: Low Risk  (02/19/2023)   Overall Financial Resource Strain (CARDIA)    Difficulty of Paying Living Expenses: Not hard at all  Food Insecurity: No Food Insecurity (03/06/2023)   Hunger Vital Sign    Worried About Running Out of Food in the Last Year: Never true    Ran Out of Food in the Last Year: Never true  Transportation Needs: No Transportation Needs (03/06/2023)   PRAPARE - Administrator, Civil Service (Medical): No    Lack of Transportation (Non-Medical): No  Physical Activity: Inactive (02/19/2023)   Exercise Vital Sign    Days of Exercise per Week: 0 days    Minutes of Exercise per Session: 0 min  Stress: No Stress Concern Present (11/26/2022)   Received from [facility] - Occupational Stress Questionnaire    Feeling of Stress : Not at all  Social Connections: Moderately Isolated (02/19/2023)   Social Connection and Isolation Panel    Frequency of Communication with Friends and Family: Three times a week    Frequency of Social Gatherings with Friends and Family: Three times a week    Attends Religious Services: Never    Active Member of Clubs or Organizations: No    Attends Banker Meetings: Never    Marital Status:  Married  Catering Manager Violence: Not At Risk (03/06/2023)   Humiliation, Afraid, Rape, and Kick questionnaire    Fear of Current or Ex-Partner: No    Emotionally Abused: No    Physically Abused: No    Sexually Abused: No    Review of Systems:  All other review of systems negative except as mentioned in the HPI.  Physical Exam: Vital signs There were no vitals taken for this visit.  General:   Alert,  Well-developed, well-nourished, pleasant and cooperative in NAD Airway:  Mallampati  Lungs:  Clear throughout to auscultation.   Heart:  Regular rate and rhythm; no murmurs, clicks, rubs,  or gallops. Abdomen:  Soft, nontender and nondistended. Normal bowel sounds.   Neuro/Psych:  Normal mood and affect. A and O x 3  Inocente Hausen, MD [facility]

## 2024-01-04 ENCOUNTER — Encounter: Payer: Self-pay | Admitting: Pediatrics

## 2024-01-04 ENCOUNTER — Ambulatory Visit (AMBULATORY_SURGERY_CENTER): Admitting: Pediatrics

## 2024-01-04 VITALS — BP 126/72 | HR 101 | Temp 98.5°F | Resp 13 | Ht 60.0 in | Wt 126.0 lb

## 2024-01-04 DIAGNOSIS — D128 Benign neoplasm of rectum: Secondary | ICD-10-CM | POA: Diagnosis not present

## 2024-01-04 DIAGNOSIS — Z1211 Encounter for screening for malignant neoplasm of colon: Secondary | ICD-10-CM

## 2024-01-04 DIAGNOSIS — I480 Paroxysmal atrial fibrillation: Secondary | ICD-10-CM | POA: Diagnosis not present

## 2024-01-04 DIAGNOSIS — K5730 Diverticulosis of large intestine without perforation or abscess without bleeding: Secondary | ICD-10-CM

## 2024-01-04 DIAGNOSIS — R195 Other fecal abnormalities: Secondary | ICD-10-CM

## 2024-01-04 DIAGNOSIS — K5669 Other partial intestinal obstruction: Secondary | ICD-10-CM

## 2024-01-04 DIAGNOSIS — D509 Iron deficiency anemia, unspecified: Secondary | ICD-10-CM

## 2024-01-04 DIAGNOSIS — E785 Hyperlipidemia, unspecified: Secondary | ICD-10-CM | POA: Diagnosis not present

## 2024-01-04 DIAGNOSIS — K6389 Other specified diseases of intestine: Secondary | ICD-10-CM

## 2024-01-04 DIAGNOSIS — C182 Malignant neoplasm of ascending colon: Secondary | ICD-10-CM

## 2024-01-04 DIAGNOSIS — D123 Benign neoplasm of transverse colon: Secondary | ICD-10-CM | POA: Diagnosis not present

## 2024-01-04 DIAGNOSIS — I10 Essential (primary) hypertension: Secondary | ICD-10-CM | POA: Diagnosis not present

## 2024-01-04 DIAGNOSIS — I2510 Atherosclerotic heart disease of native coronary artery without angina pectoris: Secondary | ICD-10-CM | POA: Diagnosis not present

## 2024-01-04 MED ORDER — SODIUM CHLORIDE 0.9 % IV SOLN: 500.0000 mL | Freq: Once | INTRAVENOUS | Status: DC

## 2024-01-04 NOTE — Patient Instructions (Addendum)
    Handout on polyps given to you today  Await pathology results on biopsies done of ascending colon mass & for polyps removed     Continue previous diet & medications    YOU HAD AN ENDOSCOPIC PROCEDURE TODAY AT THE [facility]:   Refer to the procedure report that was given to you for any specific questions about what was found during the examination.  If the procedure report does not answer your questions, please call your gastroenterologist to clarify.  If you requested that your care partner not be given the details of your procedure findings, then the procedure report has been included in a sealed envelope for you to review at your convenience later.  YOU SHOULD EXPECT: Some feelings of bloating in the abdomen. Passage of more gas than usual.  Walking can help get rid of the air that was put into your GI tract during the procedure and reduce the bloating. If you had a lower endoscopy (such as a colonoscopy or flexible sigmoidoscopy) you may notice spotting of blood in your stool or on the toilet paper. If you underwent a bowel prep for your procedure, you may not have a normal bowel movement for a few days.  Please Note:  You might notice some irritation and congestion in your nose or some drainage.  This is from the oxygen used during your procedure.  There is no need for concern and it should clear up in a day or so.  SYMPTOMS TO REPORT IMMEDIATELY:  Following lower endoscopy (colonoscopy or flexible sigmoidoscopy):  Excessive amounts of blood in the stool  Significant tenderness or worsening of abdominal pains  Swelling of the abdomen that is new, acute  Fever of 100F or higher  Call [phone] for physician on call  Do not use MyChart messaging for urgent concerns.    DIET:  We do recommend a small meal at first, but then you may proceed to your regular diet.  Drink plenty of fluids but you should avoid alcoholic beverages for 24 hours.  ACTIVITY:  You should  plan to take it easy for the rest of today and you should NOT DRIVE or use heavy machinery until tomorrow (because of the sedation medicines used during the test).    FOLLOW UP: Our staff will call the number listed on your records the next business day following your procedure.  We will call around 7:15- 8:00 am to check on you and address any questions or concerns that you may have regarding the information given to you following your procedure. If we do not reach you, we will leave a message.     If any biopsies were taken you will be contacted by phone or by letter within the next 1-3 weeks.  Please call us  at [phone] if you have not heard about the biopsies in 3 weeks.    SIGNATURES/CONFIDENTIALITY: You and/or your care partner have signed paperwork which will be entered into your electronic medical record.  These signatures attest to the fact that that the information above on your After Visit Summary has been reviewed and is understood.  Full responsibility of the confidentiality of this discharge information lies with you and/or your care-partner.

## 2024-01-04 NOTE — Progress Notes (Signed)
Updated medical record with pt

## 2024-01-04 NOTE — Progress Notes (Signed)
 Transferred to PACU via stretcher.  Not responding to stimulation at this time.  VSS upon leaving procedure room.

## 2024-01-04 NOTE — Op Note (Signed)
 [facility] Patient Name: Andrea [name] Procedure Date: 01/04/2024 10:06 AM MRN: [id number] Endoscopist: Inocente Hausen , MD, [id number] Age: 78 Referring MD:  Date of Birth: Nov 18, 1945 Gender: Female Account #: [id number] Procedure:                Colonoscopy Indications:              Last colonoscopy: 2024, Iron deficiency anemia,                            FOBT +, Incomplete colonoscopy at [facility] 2024 due                            to sigmoid stenosis. Colonoscopy is repeated today                            with the use of an ultraslim colonoscope. Medicines:                Monitored Anesthesia Care Procedure:                Pre-Anesthesia Assessment:                           - Prior to the procedure, a History and Physical                            was performed, and patient medications and                            allergies were reviewed. The patient's tolerance of                            previous anesthesia was also reviewed. The risks                            and benefits of the procedure and the sedation                            options and risks were discussed with the patient.                            All questions were answered, and informed consent                            was obtained. Prior Anticoagulants: The patient has                            taken no anticoagulant or antiplatelet agents. ASA                            Grade Assessment: III - A patient with severe                            systemic disease. After reviewing the risks and  benefits, the patient was deemed in satisfactory                            condition to undergo the procedure.                           After obtaining informed consent, the colonoscope                            was passed under direct vision. Throughout the                            procedure, the patient's blood pressure, pulse, and                            oxygen  saturations were monitored continuously. The                            PCF-H190TL Slim SN [id number] was introduced through                            the anus with the intention of advancing to the                            cecum. The scope was advanced to the ascending                            colon before the procedure was aborted. Medications                            were given. The colonoscopy was performed without                            difficulty. The patient tolerated the procedure                            well. The quality of the bowel preparation was                            good. The rectum was photographed. Scope In: 10:16:55 AM Scope Out: 10:47:50 AM Scope Withdrawal Time: 0 hours 23 minutes 28 seconds  Total Procedure Duration: 0 hours 30 minutes 55 seconds  Findings:                 The perianal and digital rectal examinations were                            normal. Pertinent negatives include normal                            sphincter tone and no palpable rectal lesions.                           A few small-mouthed diverticula were found in the  sigmoid colon.                           A fungating and polypoid partially obstructing                            large mass was found in the proximal ascending                            colon/cecum. The ultraslim colonoscope could not be                            maneuvered proximal to the mass to confirm                            landmarks. Transillumination showed a light through                            the abdominal wall in the right lower quadrant in                            the region of the mass. The mass was                            circumferential. In addition, its diameter measured                            50 mm. Biopsies were taken with a cold forceps for                            histology. Area was tattooed with an injection of 2                            mL of Spot  (carbon black) 2 cm distal to the mass.                           Two sessile polyps were found in the rectum and                            transverse colon. The polyps were 5 to 6 mm in                            size. These polyps were removed with a cold snare.                            Resection and retrieval were complete.                           A 6 mm sessile polyp was seen in the transverse                            colon. Preparations were made for snare removal,  however, polyp was not seen again after placing the                            snare through the polyp channel. The transverse                            colon was examined multiple times, however, the                            polyp was not seen again. Attention to this area on                            follow-up if patient has future colonoscopy.                           The retroflexed view of the distal rectum and anal                            verge was normal and showed no anal or rectal                            abnormalities. Complications:            No immediate complications. Estimated blood loss:                            Minimal. Estimated Blood Loss:     Estimated blood loss was minimal. Impression:               - Diverticulosis in the sigmoid colon.                           - Likely malignant partially obstructing tumor in                            the proximal ascending colon/cecum. Biopsied.                            Tattooed.                           - Two 5 to 6 mm polyps in the rectum and in the                            transverse colon, removed with a cold snare.                            Resected and retrieved.                           - A 6 mm polyp was seen in the transverse colon,                            however, was no longer visible after insertion of  snare into the working channel of scope. Attention                             to this segment of bowel if patient has a future                            colonoscopy.                           - The distal rectum and anal verge are normal on                            retroflexion view. Recommendation:           - Discharge patient to home (ambulatory).                           - Await pathology results.                           - If malignancy is confirmed, obtain updated CT                            scan of the chest abdomen and pelvis, CEA level,                            medical and surgical oncology referrals.                           - The findings and recommendations were discussed                            with the patient's family.                           - Patient has a contact number available for                            emergencies. The signs and symptoms of potential                            delayed complications were discussed with the                            patient. Return to normal activities tomorrow.                            Written discharge instructions were provided to the                            patient. Inocente Hausen, MD 01/04/2024 10:57:14 AM This report has been signed electronically.

## 2024-01-04 NOTE — Progress Notes (Signed)
Called to room to assist during endoscopic procedure.  Patient ID and intended procedure confirmed with present staff. Received instructions for my participation in the procedure from the performing physician.  Specimen sent rush

## 2024-01-05 ENCOUNTER — Inpatient Hospital Stay: Admission: RE | Admit: 2024-01-05 | Source: Ambulatory Visit

## 2024-01-05 ENCOUNTER — Telehealth: Payer: Self-pay

## 2024-01-05 LAB — SURGICAL PATHOLOGY

## 2024-01-05 NOTE — Telephone Encounter (Signed)
  Follow up Call-     01/04/2024    9:26 AM  Call back number  Post procedure Call Back phone  # [phone]  Permission to leave phone message Yes     Patient questions:  Do you have a fever, pain , or abdominal swelling? No. Pain Score  0 *  Have you tolerated food without any problems? Yes.    Have you been able to return to your normal activities? Yes.    Do you have any questions about your discharge instructions: Diet   No. Medications  No. Follow up visit  No.  Do you have questions or concerns about your Care? No.  Actions: * If pain score is 4 or above: No action needed, pain <4.

## 2024-01-06 ENCOUNTER — Telehealth: Payer: Self-pay | Admitting: Pediatrics

## 2024-01-06 ENCOUNTER — Ambulatory Visit: Payer: Self-pay | Admitting: Pediatrics

## 2024-01-06 ENCOUNTER — Encounter: Admitting: Pediatrics

## 2024-01-06 DIAGNOSIS — C182 Malignant neoplasm of ascending colon: Secondary | ICD-10-CM

## 2024-01-06 DIAGNOSIS — K6389 Other specified diseases of intestine: Secondary | ICD-10-CM

## 2024-01-06 NOTE — Telephone Encounter (Signed)
 I spoke on the telephone with Andrea [name] to review pathology from her recent colonoscopy:  FINAL DIAGNOSIS        1. Ascending Colon Biopsy, mass :       - INVASIVE MODERATELY DIFFERENTIATED ADENOCARCINOMA, SEE NOTE        2. 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  Will coordinate updated CT scan of chest abdomen and pelvis, CEA level, referral to medical and surgical oncology.

## 2024-01-06 NOTE — Telephone Encounter (Signed)
 Called & spoke with patient to review plan. Patient knows to expect calls from radiology scheduling, oncology, and [facility] to schedule appts. Patient has been advised that she can stop by the lab in the basement at her earliest convenience. No appt needed. Patient informed me that she needs a new phone & her vm is not working, but she can receive text. I told patient to call if she has any questions or concerns. Patient is in good spirits. Patient verbalized understanding of all information & had no concerns at the end of the call.

## 2024-01-06 NOTE — Telephone Encounter (Signed)
 CT order in epic. Urgent secure staff message sent to radiology scheduling to contact patient ASAP to schedule.   CEA lab order in epic.   Urgent Ambulatory referral to Oncology in Epic - [facility]   ASAP Ambulatory referral to General Surgery - [facility] entered via [facility].

## 2024-01-06 NOTE — Addendum Note (Signed)
 Addended by: Lizzete Gough N on: 01/06/2024 09:12 AM   Modules accepted: Orders

## 2024-01-11 ENCOUNTER — Other Ambulatory Visit: Payer: Self-pay | Admitting: Oncology

## 2024-01-11 ENCOUNTER — Ambulatory Visit (HOSPITAL_BASED_OUTPATIENT_CLINIC_OR_DEPARTMENT_OTHER)
Admission: RE | Admit: 2024-01-11 | Discharge: 2024-01-11 | Disposition: A | Source: Ambulatory Visit | Attending: Pediatrics | Admitting: Pediatrics

## 2024-01-11 DIAGNOSIS — K6389 Other specified diseases of intestine: Secondary | ICD-10-CM | POA: Diagnosis not present

## 2024-01-11 DIAGNOSIS — K5730 Diverticulosis of large intestine without perforation or abscess without bleeding: Secondary | ICD-10-CM | POA: Diagnosis not present

## 2024-01-11 DIAGNOSIS — I700 Atherosclerosis of aorta: Secondary | ICD-10-CM | POA: Diagnosis not present

## 2024-01-11 DIAGNOSIS — C182 Malignant neoplasm of ascending colon: Secondary | ICD-10-CM | POA: Diagnosis not present

## 2024-01-11 MED ORDER — IOHEXOL 300 MG/ML  SOLN
100.0000 mL | Freq: Once | INTRAMUSCULAR | Status: AC | PRN
  Administered 2024-01-11: 100 mL via INTRAVENOUS

## 2024-01-11 NOTE — Progress Notes (Unsigned)
 [facility] [address] [place],  [place]  [address] [phone]  Clinic Day:  01/12/2024  Referring physician: Suzann Inocente HERO, MD   HISTORY OF PRESENT ILLNESS:  The patient is an 78 y.o. female who I was asked to consult upon for newly diagnosed colon cancer.  According to the patient, she has had issues with severe anemia dating back to at least September 2024.  At that time, she recalls having a low hemoglobin of only 4.5.  This required her to receive multiple units of blood at an outside facility.  In July 2025, repeat labs showed her with a low hemoglobin of 6.5.  Apparently, she also had fecal occult blood testing done at that time, which came back positive. A colonoscopy was attempted recently, but the scope could not get beyond the obstruction in her proximal colon.  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  Of note, the patient did undergo staging CT scans yesterday, which fortunately showed no evidence of metastatic disease.  However, her tumor is very large in size at 6.8 cm, with adjacent regional lymphadenopathy highly suspicious for nodal metastasis.  She comes in today to go over her biopsy and scan results, as well as their implications.  The patient denies ever noticing any overt GI blood loss which ever alerted her to colon cancer being present.  She also denies having any particular changes in her bowel habits.  She has lost less than 5 pounds over this calendar year.  She remembers having dark stools, but this only occurred during the time she was taking oral iron.  The patient last recalls having a colonoscopy 10 years ago, which came back unremarkable.  To her knowledge, there is no family history of colorectal cancer.  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   Hyperlipemia 12/22/2016   Hyperlipidemia    Hypertension    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   11/2018-High grade-lumpectomy-radiation 02/2019-03/14/2019   Mixed hyperlipidemia 01/20/2022   Palpitations    Paroxysmal A-fib (HCC) 11/2022   Personal history of radiation therapy     PAST SURGICAL HISTORY:   Past Surgical History:  Procedure Laterality Date   ABDOMINAL HYSTERECTOMY     with reconstructive surgery   BREAST LUMPECTOMY Left    BREAST LUMPECTOMY WITH RADIOACTIVE SEED LOCALIZATION Left 01/04/2019   Procedure: LEFT BREAST LUMPECTOMY WITH RADIOACTIVE SEED LOCALIZATION;  Surgeon: Vanderbilt Ned, MD;  Location: [facility];  Service: General;  Laterality: Left;   COLONOSCOPY     INNER EAR SURGERY Left    RE-EXCISION OF BREAST LUMPECTOMY Left 01/18/2019  Procedure: RE-EXCISION OF LEFT BREAST LUMPECTOMY;  Surgeon: Vanderbilt Ned, MD;  Location: [facility];  Service: General;  Laterality: Left;   ROTATOR CUFF REPAIR Right    WRIST SURGERY Right    cyst removal    CURRENT MEDICATIONS:   Current Outpatient Medications  Medication Sig Dispense Refill   amLODipine  (NORVASC ) 5 MG tablet Take 5 mg by mouth daily.     Ascorbic Acid (VITAMIN C) 1000 MG tablet Take 1,000 mg by mouth  daily.      CRANBERRY PO Take 8,400 mg by mouth daily.     Garlic (GARLIQUE PO) Take 1 tablet by mouth daily. For cholesterol     metoprolol  succinate (TOPROL -XL) 50 MG 24 hr tablet TAKE 1 TABLET(50 MG) BY MOUTH TWICE DAILY 180 tablet 0   Multiple Vitamins-Minerals (BONEUP PO) Take 1 tablet by mouth 2 (two) times daily.     Multiple Vitamins-Minerals (OCUVITE PO) Take 1 tablet by mouth daily.      Omega-3 1000 MG CAPS Take 2,000 mg by mouth 2 (two) times daily.     omeprazole (PRILOSEC) 20 MG capsule Take 20 mg by mouth daily.     Potassium 99 MG TABS Take by mouth daily at 8 pm.     Probiotic CAPS Take 1 capsule by mouth daily.      Specialty Vitamins Products (MENOPAUSE RELIEF PO) Take 731 mg by mouth daily.     vitamin B-12 (CYANOCOBALAMIN ) 1000 MCG tablet Take 1,000 mcg by mouth daily.      zinc  gluconate 50 MG tablet Take 50 mg by mouth daily.      No current facility-administered medications for this visit.    ALLERGIES:   Allergies  Allergen Reactions   Cortisone Rash    States allegic to oral Cortisone, but not to shot   Ferrous Sulfate Other (See Comments)    Abdominal pains    FAMILY HISTORY:   Family History  Problem Relation Age of Onset   Stroke Mother    Hypertension Mother    Kidney failure Father        Neuremia kidney poisoning   Valvular heart disease Brother    Emphysema Brother    Hearing loss Brother        hearing aids   Tuberculosis Maternal Grandmother    Alcohol abuse Maternal Grandfather        phlebitis   Throat cancer Paternal Grandmother     SOCIAL HISTORY:  The patient was born in [place] , but has lived locally for all of her life.  She currently lives in town with her second husband of 9 years.  She has no biological children.  She worked at a psychiatrist for 40 years.  There is no history of alcoholism or tobacco abuse.  REVIEW OF SYSTEMS:  Review of Systems  Constitutional:  Positive for fatigue.  Musculoskeletal:   Positive for back pain and myalgias.     PHYSICAL EXAM:  There were no vitals taken for this visit. Wt Readings from Last 3 Encounters:  01/04/24 126 lb (57.2 kg)  11/18/23 126 lb 8 oz (57.4 kg)  11/11/23 126 lb 2 oz (57.2 kg)   There is no height or weight on file to calculate BMI. Performance status (ECOG): 1 - Symptomatic but completely ambulatory Physical Exam Constitutional:      Appearance: Normal appearance. She is not ill-appearing.  HENT:     Mouth/Throat:     Mouth: Mucous membranes are moist.  Pharynx: Oropharynx is clear. No oropharyngeal exudate or posterior oropharyngeal erythema.  Cardiovascular:     Rate and Rhythm: Normal rate and regular rhythm.     Heart sounds: No murmur heard.    No friction rub. No gallop.  Pulmonary:     Effort: Pulmonary effort is normal. No respiratory distress.     Breath sounds: Normal breath sounds. No wheezing, rhonchi or rales.  Abdominal:     General: Bowel sounds are normal. There is no distension.     Palpations: Abdomen is soft. There is no mass.     Tenderness: There is no abdominal tenderness.  Musculoskeletal:        General: No swelling.     Right lower leg: No edema.     Left lower leg: No edema.  Lymphadenopathy:     Cervical: No cervical adenopathy.     Upper Body:     Right upper body: No supraclavicular or axillary adenopathy.     Left upper body: No supraclavicular or axillary adenopathy.     Lower Body: No right inguinal adenopathy. No left inguinal adenopathy.  Skin:    General: Skin is warm.     Coloration: Skin is not jaundiced.     Findings: No lesion or rash.  Neurological:     General: No focal deficit present.     Mental Status: She is alert and oriented to person, place, and time. Mental status is at baseline.  Psychiatric:        Mood and Affect: Mood normal.        Behavior: Behavior normal.        Thought Content: Thought content normal.     LABS:      Latest Ref Rng & Units 01/12/2024     1:59 PM 01/01/2024   10:12 AM 11/18/2023   10:57 AM  CBC  WBC 4.0 - 10.5 K/uL 8.2  6.7  8.8   Hemoglobin 12.0 - 15.0 g/dL 7.5  8.4  C 6.6   Hematocrit 36.0 - 46.0 % 24.5  25.3  C 22.1   Platelets 150 - 400 K/uL 412  499.0  395     C Corrected result      Latest Ref Rng & Units 01/12/2024    1:59 PM 11/18/2023   10:57 AM 11/11/2023   12:07 PM  CMP  Glucose 70 - 99 mg/dL 883  893  893   BUN 8 - 23 mg/dL 19  24  24    Creatinine 0.44 - 1.00 mg/dL 9.04  8.97  9.08   Sodium 135 - 145 mmol/L 143  140  140   Potassium 3.5 - 5.1 mmol/L 3.5  4.1  4.0   Chloride 98 - 111 mmol/L 107  105  104   CO2 22 - 32 mmol/L 24  23  26    Calcium 8.9 - 10.3 mg/dL 9.1  9.1  9.2   Total Protein 6.5 - 8.1 g/dL 6.2  6.6  6.8   Total Bilirubin 0.0 - 1.2 mg/dL <9.7  <9.7  0.2   Alkaline Phos 38 - 126 U/L 97  78  63   AST 15 - 41 U/L 16  15  11    ALT 0 - 44 U/L 5  6  6      STUDIES:  CT CHEST ABDOMEN PELVIS W CONTRAST Result Date: 01/12/2024 CLINICAL DATA:  Staging, ascending colon cancer * Tracking Code: BO * EXAM: CT CHEST, ABDOMEN, AND PELVIS WITH CONTRAST TECHNIQUE: Multidetector CT  imaging of the chest, abdomen and pelvis was performed following the standard protocol during bolus administration of intravenous contrast. RADIATION DOSE REDUCTION: This exam was performed according to the departmental dose-optimization program which includes automated exposure control, adjustment of the mA and/or kV according to patient size and/or use of iterative reconstruction technique. CONTRAST:  OMNIPAQUE IOHEXOL 300 MG/ML  SOLN COMPARISON:  CT scan 03/27/2023 FINDINGS: CT CHEST FINDINGS Cardiovascular: Mitral valve calcification. Aortic atherosclerosis. Mild cardiomegaly. Mediastinum/Nodes: Postoperative clips in the left breast. Lungs/Pleura: Mild subpleural reticulation in the left upper lobe, query prior radiation therapy. Musculoskeletal: Unremarkable CT ABDOMEN PELVIS FINDINGS Hepatobiliary: Cholelithiasis. No  significant focal hepatic parenchymal lesion identified. Pancreas: Unremarkable Spleen: 1.6 cm hypodense lesion in the upper anterior spleen nonspecific but potentially a hemangioma, cystic lesion, or lymphangioma. Adrenals/Urinary Tract: Bilateral perirenal cysts. Adrenal glands unremarkable. Urinary bladder appears normal. Stomach/Bowel: Large mass in the cecum/proximal ascending colon up to about 6.8 cm in diameter on image 75 series 301. Adjacent paracolic and ileocolic lymph nodes measuring up to 1 cm in diameter on image 75 series 301. Sigmoid colon diverticulosis. Vascular/Lymphatic: Atherosclerosis is present, including aortoiliac atherosclerotic disease. Reproductive: Hysterectomy. Focal pelvic floor laxity with protruding adipose tissue just posterior to the vagina on image 86 series 602. Other: No ascites or omental or peritoneal nodularity to indicate peritoneal spread of tumor this time. Musculoskeletal: Grade 1 degenerative anterolisthesis at L4-5. IMPRESSION: 1. Mass in the cecum/proximal ascending colon up to about 6.8 cm in diameter, compatible with known malignancy. Adjacent paracolic and ileocolic lymph nodes measuring up to 1 cm in diameter, suspicious for local nodal metastatic disease. 2. No findings of distant metastatic disease. 3. Cholelithiasis. 4. Sigmoid colon diverticulosis. 5. Focal pelvic floor laxity with protruding adipose tissue just posterior to the vagina. 6. Mitral valve calcification. Mild cardiomegaly. 7. 1.6 cm hypodense lesion in the upper anterior spleen nonspecific but potentially a hemangioma, cystic lesion, or lymphangioma. 8.  Aortic Atherosclerosis (ICD10-I70.0). Electronically Signed   By: Ryan Salvage M.D.   On: 01/12/2024 15:10     ASSESSMENT & PLAN:  An 78 y.o. female who I was asked to consult upon for for what clinically appears to be stage III colon cancer.  The stage III designation is based upon what appears to be regional, pathologic lymphadenopathy  on her CT scans.  Usually, stage III disease warrants adjuvant chemotherapy.  However, for now, the most important issue is for this patient to undergo a right hemicolectomy to remove the cancer in question.  She will be referred to general surgery in [facility] to hopefully have the surgery performed within the next few weeks.  I will tentatively see this patient back in 6 weeks to go over all of her colon cancer pathology and its implications.  The patient understands all the plans discussed today and is in agreement with them.  I do appreciate McGreal, Inocente HERO, MD for his new consult.   Correna Meacham DELENA Kerns, MD

## 2024-01-12 ENCOUNTER — Other Ambulatory Visit: Payer: Self-pay | Admitting: Oncology

## 2024-01-12 ENCOUNTER — Inpatient Hospital Stay: Attending: Hematology and Oncology

## 2024-01-12 ENCOUNTER — Inpatient Hospital Stay: Attending: Hematology and Oncology | Admitting: Oncology

## 2024-01-12 DIAGNOSIS — C182 Malignant neoplasm of ascending colon: Secondary | ICD-10-CM | POA: Insufficient documentation

## 2024-01-12 DIAGNOSIS — Z800 Family history of malignant neoplasm of digestive organs: Secondary | ICD-10-CM | POA: Diagnosis not present

## 2024-01-12 DIAGNOSIS — D649 Anemia, unspecified: Secondary | ICD-10-CM | POA: Insufficient documentation

## 2024-01-12 DIAGNOSIS — C189 Malignant neoplasm of colon, unspecified: Secondary | ICD-10-CM | POA: Insufficient documentation

## 2024-01-12 LAB — CBC WITH DIFFERENTIAL (CANCER CENTER ONLY)
Abs Immature Granulocytes: 0.03 K/uL (ref 0.00–0.07)
Basophils Absolute: 0.1 K/uL (ref 0.0–0.1)
Basophils Relative: 1 %
Eosinophils Absolute: 0.1 K/uL (ref 0.0–0.5)
Eosinophils Relative: 2 %
HCT: 24.5 % — ABNORMAL LOW (ref 36.0–46.0)
Hemoglobin: 7.5 g/dL — ABNORMAL LOW (ref 12.0–15.0)
Immature Granulocytes: 0 %
Lymphocytes Relative: 10 %
Lymphs Abs: 0.9 K/uL (ref 0.7–4.0)
MCH: 29.2 pg (ref 26.0–34.0)
MCHC: 30.6 g/dL (ref 30.0–36.0)
MCV: 95.3 fL (ref 80.0–100.0)
Monocytes Absolute: 0.6 K/uL (ref 0.1–1.0)
Monocytes Relative: 8 %
Neutro Abs: 6.5 K/uL (ref 1.7–7.7)
Neutrophils Relative %: 79 %
Platelet Count: 412 K/uL — ABNORMAL HIGH (ref 150–400)
RBC: 2.57 MIL/uL — ABNORMAL LOW (ref 3.87–5.11)
RDW: 18.1 % — ABNORMAL HIGH (ref 11.5–15.5)
WBC Count: 8.2 K/uL (ref 4.0–10.5)
nRBC: 0 % (ref 0.0–0.2)

## 2024-01-12 LAB — CMP (CANCER CENTER ONLY)
ALT: <5 U/L (ref 0–44)
AST: 16 U/L (ref 15–41)
Albumin: 3.2 g/dL — ABNORMAL LOW (ref 3.5–5.0)
Alkaline Phosphatase: 97 U/L (ref 38–126)
Anion gap: 12 (ref 5–15)
BUN: 19 mg/dL (ref 8–23)
CO2: 24 mmol/L (ref 22–32)
Calcium: 9.1 mg/dL (ref 8.9–10.3)
Chloride: 107 mmol/L (ref 98–111)
Creatinine: 0.95 mg/dL (ref 0.44–1.00)
GFR, Estimated: >60 mL/min (ref >60)
Glucose, Bld: 116 mg/dL — ABNORMAL HIGH (ref 70–99)
Potassium: 3.5 mmol/L (ref 3.5–5.1)
Sodium: 143 mmol/L (ref 135–145)
Total Bilirubin: <0.2 mg/dL (ref 0.0–1.2)
Total Protein: 6.2 g/dL — ABNORMAL LOW (ref 6.5–8.1)

## 2024-01-12 LAB — CEA (ACCESS): CEA (CHCC): 4.12 ng/mL (ref 0.00–5.00)

## 2024-01-13 ENCOUNTER — Telehealth: Payer: Self-pay | Admitting: Oncology

## 2024-01-13 NOTE — Telephone Encounter (Signed)
 [name], [name], [place], RN Pt Andrea [name]  dob [birth year].  I spoke with the pt.  Pt has appointment with us  on 01-25-24 with Dr Debby.    [name]

## 2024-01-13 NOTE — Telephone Encounter (Signed)
 Contacted pt to schedule an appt. Unable to reach via phone, voicemail is full.    Follow-Up Information  Follow-up disposition: Return in about 6 weeks (around 02/23/2024).  Check out comments: labs

## 2024-01-13 NOTE — Telephone Encounter (Addendum)
 Followed up on [facility] referral, letter was sent to patient stating that they had made multiple attempts to reach her to schedule.   Called and spoke with patient. Patient states that she does not have a surgical appt. I told pt that I will leave the contact information to [facility] on her vm so that she can call and set up her appt. I called patient right back, her vm is full and not accepting any new messages.   Called [facility] to see if they can reach out to her again to schedule. Spoke with Joyce, she will contact patient today for an appt. [name] will call back with appt information.

## 2024-01-14 DIAGNOSIS — H524 Presbyopia: Secondary | ICD-10-CM | POA: Diagnosis not present

## 2024-01-14 DIAGNOSIS — H40013 Open angle with borderline findings, low risk, bilateral: Secondary | ICD-10-CM | POA: Diagnosis not present

## 2024-01-14 NOTE — Telephone Encounter (Signed)
 Patient has been scheduled. Aware of appt date and time.

## 2024-01-21 ENCOUNTER — Other Ambulatory Visit: Payer: Self-pay

## 2024-01-21 ENCOUNTER — Other Ambulatory Visit: Payer: Self-pay | Admitting: Oncology

## 2024-01-21 DIAGNOSIS — D649 Anemia, unspecified: Secondary | ICD-10-CM

## 2024-01-21 NOTE — Progress Notes (Signed)
 This encounter is for Tumor Conference and not intended to be used as an active treatment plan.

## 2024-01-25 ENCOUNTER — Ambulatory Visit: Payer: Self-pay | Admitting: General Surgery

## 2024-01-25 DIAGNOSIS — C182 Malignant neoplasm of ascending colon: Secondary | ICD-10-CM | POA: Diagnosis not present

## 2024-01-25 NOTE — H&P (Signed)
 REFERRING PHYSICIAN:  McGreal, Inocente Jansky, MD   PROVIDER:  BERNARDA WANDA NED, MD   MRN: [id number] DOB: [birth year] DATE OF ENCOUNTER: 01/25/2024   Subjective    Chief Complaint: New Cancer (Cancer of ascending colon)       History of Present Illness: Andrea [name] is a 78 y.o. female who is seen today as an office consultation at the request of Dr. Suzann for evaluation of New Cancer (Cancer of ascending colon) .  Patient underwent colonoscopy recently due to anemia and a positive fecal occult blood test.  She had an incomplete colonoscopy at [facility] last year due to sigmoid stenosis.  She was noted to have a likely malignant partially obstructing tumor in the proximal ascending colon.  This was biopsied and tattooed.  Biopsy showed invasive moderately differentiated adenocarcinoma.     Review of Systems: A complete review of systems was obtained from the patient.  I have reviewed this information and discussed as appropriate with the patient.  See HPI as well for other ROS.     Medical History: Past Medical History      Past Medical History:  Diagnosis Date   History of cancer           Problem List  There is no problem list on file for this patient.      Past Surgical History       Past Surgical History:  Procedure Laterality Date   breast  lumpectomy Left     HYSTERECTOMY            Allergies       Allergies  Allergen Reactions   Cortisone Unknown   Others Rash      Uncoded Allergy. Allergen: CORTISONE INJECTIONS   Ferrous Sulfate Other (See Comments)      Abdominal pains        Medications Ordered Prior to Encounter        Current Outpatient Medications on File Prior to Visit  Medication Sig Dispense Refill   ascorbic acid, vitamin C, (VITAMIN C) 1000 MG tablet Take by mouth       aspirin 81 MG EC tablet Take 81 mg by mouth once daily       beta carotene 10000 UNIT capsule Take by mouth       calcium carbonate-vitamin D3 (OS-CAL 500+D) 500  mg-5 mcg (200 unit) tablet Take 1 tablet by mouth once daily       carvediloL  (COREG ) 6.25 MG tablet Take 3.125 mg by mouth 2 (two) times daily       cranberry fruit extract (CRANBERRY EXTRACT, BULK,) 12:1 Powd Take 1 tablet by mouth once daily       cyanocobalamin  (VITAMIN B12) 1,000 mcg SL tablet Take by mouth       gabapentin  (NEURONTIN ) 100 MG capsule         Lacto.acidophilus-Bif.animalis 32 billion cell Cap Take 1 capsule by mouth once daily       Lactobacillus acidophilus (PROBIOTIC ORAL) Take by mouth       magnesium oxide-Mg AA chelate (MAGNESIUM, OXIDE/AA CHELATE,) 300 mg Cap Take by mouth       multivitamin with minerals tablet Take 1 tablet by mouth once daily       omega-3/dha/epa/dpa/fish oil (OMEGA-3 2100 ORAL) Omega 3       omeprazole (PRILOSEC) 20 MG DR capsule daily       tamoxifen  (NOLVADEX ) 10 MG tablet Take 5 mg by mouth once daily  zinc  gluconate 50 mg tablet Take by mouth        No current facility-administered medications on file prior to visit.        Family History       Family History  Problem Relation Age of Onset   High blood pressure (Hypertension) Mother     High blood pressure (Hypertension) Brother          Tobacco Use History  Social History       Tobacco Use  Smoking Status Never  Smokeless Tobacco Never        Social History  Social History        Socioeconomic History   Marital status: Married  Tobacco Use   Smoking status: Never   Smokeless tobacco: Never  Vaping Use   Vaping status: Never Used  Substance and Sexual Activity   Alcohol use: Never   Drug use: Never   Sexual activity: Defer    Social Drivers of Health        Financial Resource Strain: Low Risk  (02/19/2023)    Received from [facility]    Overall Financial Resource Strain (CARDIA)     Difficulty of Paying Living Expenses: Not hard at all  Food Insecurity: No Food Insecurity (03/06/2023)    Received from [facility]    Hunger Vital Sign     Within the  past 12 months, you worried that your food would run out before you got the money to buy more.: Never true     Within the past 12 months, the food you bought just didn't last and you didn't have money to get more.: Never true  Transportation Needs: No Transportation Needs (03/06/2023)    Received from [facility] - Transportation     Lack of Transportation (Medical): No     Lack of Transportation (Non-Medical): No  Physical Activity: Inactive (02/19/2023)    Received from [facility]    Exercise Vital Sign     On average, how many days per week do you engage in moderate to strenuous exercise (like a brisk walk)?: 0 days     On average, how many minutes do you engage in exercise at this level?: 0 min  Stress: No Stress Concern Present (11/26/2022)    Received from [facility] - Occupational Stress Questionnaire     Feeling of Stress : Not at all  Social Connections: Moderately Isolated (02/19/2023)    Received from [facility]    Social Connection and Isolation Panel     In a typical week, how many times do you talk on the phone with family, friends, or neighbors?: Three times a week     How often do you get together with friends or relatives?: Three times a week     How often do you attend church or religious services?: Never     Do you belong to any clubs or organizations such as church groups, unions, fraternal or athletic groups, or school groups?: No     How often do you attend meetings of the clubs or organizations you belong to?: Never     Are you married, widowed, divorced, separated, never married, or living with a partner?: Married  Housing Stability: Unknown (01/25/2024)    Housing Stability Vital Sign     Homeless in the Last Year: No        Objective:  Vitals:    01/25/24 0917  BP: 120/64  Pulse: (!) 118  Temp: 36.8 C (98.2 F)  TempSrc: Temporal  SpO2: 100%  Weight: 54.5 kg (120 lb 3.2 oz)  Height:  154.9 cm (5' 1)  PainSc: 0-No pain      Exam Gen: NAD Abd: soft     Labs, Imaging and Diagnostic Testing: CEA 4.1 hemoglobin 7.5 CT chest abdomen and pelvis shows a mass in the cecum approximately 6.8 cm.  There are adjacent pericolic lymph nodes measuring up to 1 cm.  No other findings for distal metastatic disease noted. Assessment and Plan:  Colon cancer, ascending (CMS/HHS-HCC)  (primary encounter diagnosis) 78 year old female with anemia who presents to the office with a newly diagnosed ascending colon cancer.  We discussed partial colon resection today.  All questions were answered.   The surgery and anatomy were described to the patient as well as the risks of surgery and the possible complications.  These include: Bleeding, deep abdominal infections and possible wound complications such as hernia and infection, damage to adjacent structures, leak of surgical connections, which can lead to other surgeries and possibly an ostomy, possible need for other procedures, such as abscess drains in radiology, possible prolonged hospital stay, possible diarrhea from removal of part of the colon, possible constipation from narcotics, possible bowel, bladder or sexual dysfunction if having rectal surgery, prolonged fatigue/weakness or appetite loss, possible early recurrence of of disease, possible complications of their medical problems such as heart disease or arrhythmias or lung problems, death (less than 1%). I believe the patient understands and wishes to proceed with the surgery.      No follow-ups on file.     [name], MD Colon and Rectal Surgery [facility]

## 2024-01-27 ENCOUNTER — Telehealth: Payer: Self-pay | Admitting: Cardiology

## 2024-01-27 NOTE — Telephone Encounter (Signed)
 Surgery center called in stating pt needs clearance but pt wants to switch to Dr. Liborio, is this switch okay?

## 2024-01-27 NOTE — Telephone Encounter (Signed)
 01/27/24: Called pt to inform pt provider switch was approved and we can sch f/u / preop clearance, [facility] full - CRM

## 2024-02-02 ENCOUNTER — Ambulatory Visit

## 2024-02-02 VITALS — BP 112/58 | HR 112 | Ht 61.0 in | Wt 119.0 lb

## 2024-02-02 DIAGNOSIS — D649 Anemia, unspecified: Secondary | ICD-10-CM | POA: Diagnosis present

## 2024-02-02 DIAGNOSIS — I480 Paroxysmal atrial fibrillation: Secondary | ICD-10-CM | POA: Diagnosis present

## 2024-02-02 DIAGNOSIS — I10 Essential (primary) hypertension: Secondary | ICD-10-CM | POA: Insufficient documentation

## 2024-02-02 DIAGNOSIS — I2510 Atherosclerotic heart disease of native coronary artery without angina pectoris: Secondary | ICD-10-CM | POA: Diagnosis present

## 2024-02-02 DIAGNOSIS — Z7982 Long term (current) use of aspirin: Secondary | ICD-10-CM | POA: Diagnosis not present

## 2024-02-02 DIAGNOSIS — I429 Cardiomyopathy, unspecified: Secondary | ICD-10-CM | POA: Insufficient documentation

## 2024-02-02 DIAGNOSIS — R54 Age-related physical debility: Secondary | ICD-10-CM | POA: Diagnosis present

## 2024-02-02 DIAGNOSIS — Z825 Family history of asthma and other chronic lower respiratory diseases: Secondary | ICD-10-CM | POA: Diagnosis not present

## 2024-02-02 DIAGNOSIS — H9190 Unspecified hearing loss, unspecified ear: Secondary | ICD-10-CM | POA: Diagnosis present

## 2024-02-02 DIAGNOSIS — K219 Gastro-esophageal reflux disease without esophagitis: Secondary | ICD-10-CM | POA: Diagnosis present

## 2024-02-02 DIAGNOSIS — Z01810 Encounter for preprocedural cardiovascular examination: Secondary | ICD-10-CM | POA: Diagnosis not present

## 2024-02-02 DIAGNOSIS — D500 Iron deficiency anemia secondary to blood loss (chronic): Secondary | ICD-10-CM | POA: Diagnosis present

## 2024-02-02 DIAGNOSIS — I110 Hypertensive heart disease with heart failure: Secondary | ICD-10-CM | POA: Diagnosis present

## 2024-02-02 DIAGNOSIS — Z8419 Family history of other disorders of kidney and ureter: Secondary | ICD-10-CM | POA: Diagnosis not present

## 2024-02-02 DIAGNOSIS — Z823 Family history of stroke: Secondary | ICD-10-CM | POA: Diagnosis not present

## 2024-02-02 DIAGNOSIS — Z86000 Personal history of in-situ neoplasm of breast: Secondary | ICD-10-CM | POA: Diagnosis not present

## 2024-02-02 DIAGNOSIS — Z79899 Other long term (current) drug therapy: Secondary | ICD-10-CM | POA: Diagnosis not present

## 2024-02-02 DIAGNOSIS — I341 Nonrheumatic mitral (valve) prolapse: Secondary | ICD-10-CM | POA: Diagnosis present

## 2024-02-02 DIAGNOSIS — D630 Anemia in neoplastic disease: Secondary | ICD-10-CM | POA: Diagnosis present

## 2024-02-02 DIAGNOSIS — K6389 Other specified diseases of intestine: Secondary | ICD-10-CM | POA: Diagnosis not present

## 2024-02-02 DIAGNOSIS — I3481 Nonrheumatic mitral (valve) annulus calcification: Secondary | ICD-10-CM | POA: Diagnosis present

## 2024-02-02 DIAGNOSIS — C182 Malignant neoplasm of ascending colon: Secondary | ICD-10-CM | POA: Diagnosis present

## 2024-02-02 DIAGNOSIS — E782 Mixed hyperlipidemia: Secondary | ICD-10-CM | POA: Insufficient documentation

## 2024-02-02 DIAGNOSIS — Z8249 Family history of ischemic heart disease and other diseases of the circulatory system: Secondary | ICD-10-CM | POA: Diagnosis not present

## 2024-02-02 DIAGNOSIS — I340 Nonrheumatic mitral (valve) insufficiency: Secondary | ICD-10-CM | POA: Diagnosis present

## 2024-02-02 DIAGNOSIS — I5032 Chronic diastolic (congestive) heart failure: Secondary | ICD-10-CM | POA: Diagnosis present

## 2024-02-02 DIAGNOSIS — Z888 Allergy status to other drugs, medicaments and biological substances status: Secondary | ICD-10-CM | POA: Diagnosis not present

## 2024-02-02 DIAGNOSIS — Z923 Personal history of irradiation: Secondary | ICD-10-CM | POA: Diagnosis not present

## 2024-02-02 MED ORDER — METOPROLOL SUCCINATE ER 50 MG PO TB24: 50.0000 mg | ORAL_TABLET | Freq: Two times a day (BID) | ORAL | 3 refills | Status: AC

## 2024-02-02 NOTE — Patient Instructions (Addendum)
 SURGICAL WAITING ROOM VISITATION Patients having surgery or a procedure may have no more than 2 support people in the waiting area - these visitors may rotate.    Children under the age of 52 must have an adult with them who is not the patient.  If the patient needs to stay at the hospital during part of their recovery, the visitor guidelines for inpatient rooms apply. Pre-op nurse will coordinate an appropriate time for 1 support person to accompany patient in pre-op.  This support person may not rotate.    Please refer to the [facility] website for the visitor guidelines for Inpatients (after your surgery is over and you are in a regular room).       Your procedure is scheduled on: 02-05-24   Report to [facility] Main Entrance    Report to admitting at 5:15 AM   Call this number if you have problems the morning of surgery [phone]   Follow a clear liquid diet day of prep to prevent dehydration   After Midnight you may have the following liquids until 4:30 AM DAY OF SURGERY  Water Non-Citrus Juices (without pulp, NO RED-Apple, White grape, White cranberry) Black Coffee (NO MILK/CREAM OR CREAMERS, sugar ok)  Clear Tea (NO MILK/CREAM OR CREAMERS, sugar ok) regular and decaf                             Plain Jell-O (NO RED)                                           Fruit ices (not with fruit pulp, NO RED)                                     Popsicles (NO RED)                                                               Sports drinks like Gatorade (NO RED)  Drink 2 Pre-surgery Ensure the evening before surgery                   The day of surgery:  Drink ONE (1) Pre-Surgery Clear Ensure by 4:30 AM the morning of surgery. Drink in one sitting. Do not sip.  This drink was given to you during your hospital  pre-op appointment visit. Nothing else to drink after completing the Pre-Surgery Clear Ensure.          If you have questions, please contact your surgeon's  office.   FOLLOW BOWEL PREP AND ANY ADDITIONAL PRE OP INSTRUCTIONS YOU RECEIVED FROM YOUR SURGEON'S OFFICE!!!     Oral Hygiene is also important to reduce your risk of infection.                                    Remember - BRUSH YOUR TEETH THE MORNING OF SURGERY WITH YOUR REGULAR TOOTHPASTE   Do NOT smoke after Midnight   Take these medicines  the morning of surgery with A SIP OF WATER:    Metoprolol    Omeprazole   Zyrtec  Stop all vitamins and herbal supplements 7 days before surgery                              You may not have any metal on your body including hair pins, jewelry, and body piercing             Do not wear make-up, lotions, powders, perfumes or deodorant  Do not wear nail polish including gel and S&S, artificial/acrylic nails, or any other type of covering on natural nails including finger and toenails. If you have artificial nails, gel coating, etc. that needs to be removed by a nail salon please have this removed prior to surgery or surgery may need to be canceled/ delayed if the surgeon/ anesthesia feels like they are unable to be safely monitored.   Do not shave  48 hours prior to surgery.     Do not bring valuables to the hospital. [facility] IS NOT RESPONSIBLE   FOR VALUABLES.   Contacts, dentures or bridgework may not be worn into surgery.   Bring small overnight bag day of surgery.   DO NOT BRING YOUR HOME MEDICATIONS TO THE HOSPITAL. PHARMACY WILL DISPENSE MEDICATIONS LISTED ON YOUR MEDICATION LIST TO YOU DURING YOUR ADMISSION IN THE HOSPITAL!                Please read over the following fact sheets you were given: IF YOU HAVE QUESTIONS ABOUT YOUR PRE-OP INSTRUCTIONS PLEASE CALL [phone] G[facility]  If you received a COVID test during your pre-op visit  it is requested that you wear a mask when out in public, stay away from anyone that may not be feeling well and notify your surgeon if you develop symptoms. If you test positive for Covid or have been in  contact with anyone that has tested positive in the last 10 days please notify you surgeon. [facility] - Preparing for Surgery Before surgery, you can play an important role.  Because skin is not sterile, your skin needs to be as free of germs as possible.  You can reduce the number of germs on your skin by washing with CHG (chlorahexidine gluconate) soap before surgery.  CHG is an antiseptic cleaner which kills germs and bonds with the skin to continue killing germs even after washing. Please DO NOT use if you have an allergy to CHG or antibacterial soaps.  If your skin becomes reddened/irritated stop using the CHG and inform your nurse when you arrive at Short Stay. Do not shave (including legs and underarms) for at least 48 hours prior to the first CHG shower.  You may shave your face/neck.  Please follow these instructions carefully:  1.  Shower with CHG Soap the night before surgery ONLY (DO NOT USE THE SOAP THE MORNING OF SURGERY).  2.  If you choose to wash your hair, wash your hair first as usual with your normal  shampoo.  3.  After you shampoo, rinse your hair and body thoroughly to remove the shampoo.                             4.  Use CHG as you would any other liquid soap.  You can apply chg directly to the skin and wash.  Gently with a  scrungie or clean washcloth.  5.  Apply the CHG Soap to your body ONLY FROM THE NECK DOWN.   Do   not use on face/ open                           Wound or open sores. Avoid contact with eyes, ears mouth and   genitals (private parts).                       Wash face,  Genitals (private parts) with your normal soap.             6.  Wash thoroughly, paying special attention to the area where your    surgery  will be performed.  7.  Thoroughly rinse your body with warm water from the neck down.  8.  DO NOT shower/wash with your normal soap after using and rinsing off the CHG Soap.                9.  Pat yourself dry with a clean towel.            10.   Wear clean pajamas.            11.  Place clean sheets on your bed the night of your first shower and do not  sleep with pets. Day of Surgery :  Shower with regular soap Do not apply any CHG, lotions/deodorants the morning of surgery.  Please wear clean clothes to the hospital/surgery center.  FAILURE TO FOLLOW THESE INSTRUCTIONS MAY RESULT IN THE CANCELLATION OF YOUR SURGERY  PATIENT SIGNATURE_________________________________  NURSE SIGNATURE__________________________________   Incentive Spirometer  An incentive spirometer is a tool that can help keep your lungs clear and active. This tool measures how well you are filling your lungs with each breath. Taking long deep breaths may help reverse or decrease the chance of developing breathing (pulmonary) problems (especially infection) following: A long period of time when you are unable to move or be active. BEFORE THE PROCEDURE  If the spirometer includes an indicator to show your best effort, your nurse or respiratory therapist will set it to a desired goal. If possible, sit up straight or lean slightly forward. Try not to slouch. Hold the incentive spirometer in an upright position. INSTRUCTIONS FOR USE  Sit on the edge of your bed if possible, or sit up as far as you can in bed or on a chair. Hold the incentive spirometer in an upright position. Breathe out normally. Place the mouthpiece in your mouth and seal your lips tightly around it. Breathe in slowly and as deeply as possible, raising the piston or the ball toward the top of the column. Hold your breath for 3-5 seconds or for as long as possible. Allow the piston or ball to fall to the bottom of the column. Remove the mouthpiece from your mouth and breathe out normally. Rest for a few seconds and repeat Steps 1 through 7 at least 10 times every 1-2 hours when you are awake. Take your time and take a few normal breaths between deep breaths. The spirometer may include an indicator  to show your best effort. Use the indicator as a goal to work toward during each repetition. After each set of 10 deep breaths, practice coughing to be sure your lungs are clear. If you have an incision (the cut made at the time of surgery), support your incision when coughing  by placing a pillow or rolled up towels firmly against it. Once you are able to get out of bed, walk around indoors and cough well. You may stop using the incentive spirometer when instructed by your caregiver.  RISKS AND COMPLICATIONS Take your time so you do not get dizzy or light-headed. If you are in pain, you may need to take or ask for pain medication before doing incentive spirometry. It is harder to take a deep breath if you are having pain. AFTER USE Rest and breathe slowly and easily. It can be helpful to keep track of a log of your progress. Your caregiver can provide you with a simple table to help with this. If you are using the spirometer at home, follow these instructions: SEEK MEDICAL CARE IF:  You are having difficultly using the spirometer. You have trouble using the spirometer as often as instructed. Your pain medication is not giving enough relief while using the spirometer. You develop fever of 100.5 F (38.1 C) or higher. SEEK IMMEDIATE MEDICAL CARE IF:  You cough up bloody sputum that had not been present before. You develop fever of 102 F (38.9 C) or greater. You develop worsening pain at or near the incision site. MAKE SURE YOU:  Understand these instructions. Will watch your condition. Will get help right away if you are not doing well or get worse. Document Released: 06/30/2006 Document Revised: 05/12/2011 Document Reviewed: 08/31/2006 [employer] Patient Information 2014 [employer], [place].   ________________________________________________________________________ ________________________________________________________________________ WHAT IS A BLOOD TRANSFUSION? Blood Transfusion  Information  A transfusion is the replacement of blood or some of its parts. Blood is made up of multiple cells which provide different functions. Red blood cells carry oxygen and are used for blood loss replacement. White blood cells fight against infection. Platelets control bleeding. Plasma helps clot blood. Other blood products are available for specialized needs, such as hemophilia or other clotting disorders. BEFORE THE TRANSFUSION  Who gives blood for transfusions?  Healthy volunteers who are fully evaluated to make sure their blood is safe. This is blood bank blood. Transfusion therapy is the safest it has ever been in the practice of medicine. Before blood is taken from a donor, a complete history is taken to make sure that person has no history of diseases nor engages in risky social behavior (examples are intravenous drug use or sexual activity with multiple partners). The donor's travel history is screened to minimize risk of transmitting infections, such as malaria. The donated blood is tested for signs of infectious diseases, such as HIV and hepatitis. The blood is then tested to be sure it is compatible with you in order to minimize the chance of a transfusion reaction. If you or a relative donates blood, this is often done in anticipation of surgery and is not appropriate for emergency situations. It takes many days to process the donated blood. RISKS AND COMPLICATIONS Although transfusion therapy is very safe and saves many lives, the main dangers of transfusion include:  Getting an infectious disease. Developing a transfusion reaction. This is an allergic reaction to something in the blood you were given. Every precaution is taken to prevent this. The decision to have a blood transfusion has been considered carefully by your caregiver before blood is given. Blood is not given unless the benefits outweigh the risks. AFTER THE TRANSFUSION Right after receiving a blood transfusion,  you will usually feel much better and more energetic. This is especially true if your red blood cells have gotten low (  anemic). The transfusion raises the level of the red blood cells which carry oxygen, and this usually causes an energy increase. The nurse administering the transfusion will monitor you carefully for complications. HOME CARE INSTRUCTIONS  No special instructions are needed after a transfusion. You may find your energy is better. Speak with your caregiver about any limitations on activity for underlying diseases you may have. SEEK MEDICAL CARE IF:  Your condition is not improving after your transfusion. You develop redness or irritation at the intravenous (IV) site. SEEK IMMEDIATE MEDICAL CARE IF:  Any of the following symptoms occur over the next 12 hours: Shaking chills. You have a temperature by mouth above 102 F (38.9 C), not controlled by medicine. Chest, back, or muscle pain. People around you feel you are not acting correctly or are confused. Shortness of breath or difficulty breathing. Dizziness and fainting. You get a rash or develop hives. You have a decrease in urine output. Your urine turns a dark color or changes to pink, red, or brown. Any of the following symptoms occur over the next 10 days: You have a temperature by mouth above 102 F (38.9 C), not controlled by medicine. Shortness of breath. Weakness after normal activity. The white part of the eye turns yellow (jaundice). You have a decrease in the amount of urine or are urinating less often. Your urine turns a dark color or changes to pink, red, or brown. Document Released: 02/15/2000 Document Revised: 05/12/2011 Document Reviewed: 10/04/2007 [facility] Patient Information 2014 [place], [place].  _______________________________________________________________________

## 2024-02-02 NOTE — Assessment & Plan Note (Signed)
 Diagnosed around September 2024. 5% A-fib burden on Zio patch in October 2024. She is adamant her A-fib is related to her acute anemia. Which is possible. However reviewed other potential causes of atrial fibrillation including aging, dilated left atrial size, ongoing physical and emotional stress.  CHA2DS2-VASc score at least 3.  Currently not a candidate for stroke prophylaxis using anticoagulant given her anemia and ongoing GI blood loss requiring multiple blood transfusions. She is adamantly against considering anticoagulation even in the long run once her acute GI issues are taken care of.  Will further review this after her surgical issues are addressed at subsequent follow-up visits. Would be helpful if family members accompany her to further discuss approach and for atrial fibrillation management in the long run.

## 2024-02-02 NOTE — Assessment & Plan Note (Signed)
 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  Last echocardiogram to review from September 2024 at [facility] showed normal biventricular functionn with LVEF 55 to 60% No active cardiac symptoms. Appears euvolemic and compensated

## 2024-02-02 NOTE — Assessment & Plan Note (Signed)
 elective noncardiac surgery being planned with Dr. 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 Tentatively scheduled for December 5.  From cardiac standpoint no active of symptoms of chest pain, shortness of breath, no active symptoms suggestive of angina or heart failure.  Appears euvolemic and compensated. She does have sinus tachycardia in the setting of anemia which is a physiological response.  No obvious ischemic changes at her current elevated heart rates and sinus tachycardia.  From cardiac standpoint okay to proceed with her elective surgery as being planned. She does remain at elevated risk for perioperative cardiac complications given her elderly age, anemia, sinus tachycardia at baseline. However no significant change in management is expected with further cardiac risk stratification. No ischemic evaluation indicated at this time.  Proceed with her surgery as being planned.

## 2024-02-02 NOTE — Assessment & Plan Note (Signed)
 Pressures well-controlled in fact on the lower side. Given her elderly age and frailty, would discontinue amlodipine  as most likely she would be asked to hold this off prior to her surgery on Friday.   Continue metoprolol  succinate 50 mg twice daily

## 2024-02-02 NOTE — Progress Notes (Signed)
 Cardiology Consultation:    Date:  02/02/2024   ID:  Andrea [name], Andrea [name] 08/19/1945, MRN [id number]  PCP:  Teressa Harrie HERO, FNP  Cardiologist:  Alean SAUNDERS Canary Fister, MD   Referring MD: Teressa Harrie HERO, FNP   No chief complaint on file.    ASSESSMENT AND PLAN:   Andrea [name] 78 year old woman with history of  remote cardiac cath at [facility] in a truck (report not available; no obstruction; do not have the pump review myself], paroxysmal atrial fibrillation [identified during inpatient stay for acute anemia in 11/2022 at [facility], spontaneously converted; Zio patch 14-day study October 2024 5% A-fib burden],  cardiomyopathy with recovered LVEF [45 to 50% in October 2019 no ischemic workup following this; normalized LVEF 60 to 65% January 2024], hypertension, hyperlipidemia, GERD, left breast cancer s/p lumpectomy and radiation therapy December 2020, mild mitral regurgitation associated with mitral annular calcification and mitral valve prolapse, coronary and aortic atherosclerosis. Last echocardiogram report from 11/27/2022 at [facility] health noted normal biventricular size and function LVEF 55 to 60%, grade 1 diastolic dysfunction, mildly dilated left atrium mild MR and mild TR. Colonoscopy at [facility] last year noted sigmoid stenosis, follow-up CT imaging suspicious for 6.8 cm cecal mass concerning for ascending colon cancer and is being considered to be Has been following up with surgeon Dr. Debby.  Previously followed up with Dr. Edwyna at our office, and transition care to me prior to her elective noncardiac surgery being planned with Dr. 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   Problem List Items Addressed This Visit     Coronary atherosclerosis   Remote history  of cardiac cath without any obstructive disease, based on cardiac catheter at [facility] several years ago, reports of this not available.  Mild  coronary atherosclerosis on CT chest imaging. Currently not on aspirin or other antiplatelets given her anemia and per her personal preference.  No active anginal symptoms. No ischemic changes on EKG. Okay to proceed with her elective surgery as being planned.        Relevant Medications   metoprolol  succinate (TOPROL -XL) 50 MG 24 hr tablet   Essential hypertension   Pressures well-controlled in fact on the lower side. Given her elderly age and frailty, would discontinue amlodipine  as most likely she would be asked to hold this off prior to her surgery on Friday.   Continue metoprolol  succinate 50 mg twice daily      Relevant Medications   metoprolol  succinate (TOPROL -XL) 50 MG 24 hr tablet   Other Relevant Orders   EKG 12-Lead (Completed)   Cardiomyopathy (HCC)   History of mildly reduced LVEF in October 2019 and subsequently normalized.  Last echocardiogram to review from September 2024 at [facility] showed normal biventricular functionn with LVEF 55 to 60% No active cardiac symptoms. Appears euvolemic and compensated      Relevant Medications   metoprolol  succinate (TOPROL -XL) 50 MG 24 hr tablet   Mixed hyperlipidemia   Last lipid panel reviewed from September 2024 with LDL 76, HDL 90, total cholesterol 819 and triglycerides 73. Takes fish oil. Not on any statins.  Prefers not to be on.       Relevant Medications   metoprolol  succinate (TOPROL -XL) 50 MG 24 hr tablet   Other Relevant Orders   EKG 12-Lead (Completed)   Paroxysmal A-fib (HCC) - Primary   Diagnosed around September 2024. 5% A-fib burden on Zio patch in October 2024. She  is adamant her A-fib is related to her acute anemia. Which is possible. However reviewed other potential causes of atrial fibrillation including aging, dilated left atrial size, ongoing physical and emotional stress.  CHA2DS2-VASc score at least 3.  Currently not a candidate for stroke prophylaxis using anticoagulant given her  anemia and ongoing GI blood loss requiring multiple blood transfusions. She is adamantly against considering anticoagulation even in the long run once her acute GI issues are taken care of.  Will further review this after her surgical issues are addressed at subsequent follow-up visits. Would be helpful if family members accompany her to further discuss approach and for atrial fibrillation management in the long run.      Relevant Medications   metoprolol  succinate (TOPROL -XL) 50 MG 24 hr tablet   Other Relevant Orders   EKG 12-Lead (Completed)   Preop cardiovascular exam   elective noncardiac surgery being planned with Dr. 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 Tentatively scheduled for December 5.  From cardiac standpoint no active of symptoms of chest pain, shortness of breath, no active symptoms suggestive of angina or heart failure.  Appears euvolemic and compensated. She does have sinus tachycardia in the setting of anemia which is a physiological response.  No obvious ischemic changes at her current elevated heart rates and sinus tachycardia.  From cardiac standpoint okay to proceed with her elective surgery as being planned. She does remain at elevated risk for perioperative cardiac complications given her elderly age, anemia, sinus tachycardia at baseline. However no significant change in management is expected with further cardiac risk stratification. No ischemic evaluation indicated at this time.  Proceed with her surgery as being planned.        Mild mitral regurgitation   Associated with mitral annular calcification and mild mitral valve prolapse reported on echocardiograms in the past last echocardiogram September 2024 at [facility].  Reviewed the findings with her and I explained that this degree of mitral regurgitation is not expected to cause any symptoms and we can follow-up with repeat imaging tentatively in 1  year.      Relevant Medications   metoprolol  succinate (TOPROL -XL) 50 MG 24 hr tablet   Return to clinic tentatively in 3 months.   History of Present Illness:    Andrea [name] is a 78 y.o. female who is being seen today for follow-up visit. PCP Teressa Harrie HERO, FNP. Last visit at our office was with Dr. Revankar 01/15/2023. Here for preop cardiovascular risk assessment prior to partial robotic assisted laparoscopic colectomy tentatively being scheduled with Dr. Debby  History of remote cardiac cath at [facility] in a truck (report not available; no obstruction; do not have the pump review myself], paroxysmal atrial fibrillation [identified during inpatient stay for acute anemia in 11/2022 at [facility] health, spontaneously converted; Zio patch 14-day study October 2024 5% A-fib burden],  cardiomyopathy with recovered LVEF [45 to 50% in October 2019 no ischemic workup following this; normalized LVEF 60 to 65% January 2024], hypertension, hyperlipidemia, GERD, left breast cancer s/p lumpectomy and radiation therapy December 2020, mild mitral regurgitation associated with mitral annular calcification and mitral valve prolapse, coronary and aortic atherosclerosis. Last echocardiogram report from 11/27/2022 at [facility] health noted normal biventricular size and function LVEF 55 to 60%, grade 1 diastolic dysfunction, mildly dilated left atrium mild MR and mild TR. Colonoscopy at [facility] health last year noted sigmoid stenosis, follow-up CT imaging suspicious for 6.8 cm cecal mass concerning  for ascending colon cancer and is being considered to be Has been following up with surgeon Dr. Debby.  Here for preop cardiovascular risk assessment. Pleasant woman lives with her husband at home. Mentions over the past year with anemia that has been severe she is somewhat limited with her functional capacity and mentions getting tired when she is trying to do mild to moderately exertional activities.   However when her blood counts were improved posttransfusion she feels her energy levels were excellent.  Denies any symptoms of chest pain or shortness of breath at rest.  Denies any palpitations, lightheadedness, dizziness or syncopal episodes.  Mentions had atrial fibrillation was mostly related to anemia and she has not had any symptoms.  She is strictly against  consider a atrial fibrillation as a possible longstanding issue.  Not on anticoagulant due to her ongoing anemia issues.  Denies any syncopal or near syncopal episodes. Denies any blood in urine or stools.  Mentions takes medications consistently. Amlodipine  and metoprolol  have been on long-term. She mentions her blood pressures are low and was wondering if amlodipine  needs to be continued.  EKG in the clinic today shows sinus rhythm heart rate 112/min, PR interval 122 ms, QRS duration 68 ms, QTc 428 ms.  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   Hyperlipemia 12/22/2016   Hyperlipidemia    Hypertension    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   11/2018-High grade-lumpectomy-radiation 02/2019-03/14/2019   Mixed hyperlipidemia 01/20/2022   Palpitations    Paroxysmal A-fib (HCC) 11/2022   Personal history of radiation therapy     Past Surgical History:  Procedure Laterality Date    ABDOMINAL HYSTERECTOMY     with reconstructive surgery   BREAST LUMPECTOMY Left    BREAST LUMPECTOMY WITH RADIOACTIVE SEED LOCALIZATION Left 01/04/2019   Procedure: LEFT BREAST LUMPECTOMY WITH RADIOACTIVE SEED LOCALIZATION;  Surgeon: Vanderbilt Debby, MD;  Location: [facility];  Service: General;  Laterality: Left;   COLONOSCOPY     INNER EAR SURGERY Left    RE-EXCISION OF BREAST LUMPECTOMY Left 01/18/2019   Procedure: RE-EXCISION OF LEFT BREAST LUMPECTOMY;  Surgeon: Vanderbilt Debby, MD;  Location: [facility];  Service: General;  Laterality: Left;   ROTATOR CUFF REPAIR Right    WRIST SURGERY Right    cyst removal    Current Medications: Current Meds  Medication Sig   Ascorbic Acid (VITAMIN C) 1000 MG tablet Take 1,000 mg by mouth daily.    cetirizine (ZYRTEC) 10 MG tablet Take 10 mg by mouth 2 (two) times daily as needed for allergies.   CRANBERRY PO Take 8,400 mg by mouth daily. With vitamin C   Cyanocobalamin  (VITAMIN B-12 PO) Take 2,500 mcg by mouth daily. Dissolvable tablet   Garlic (GARLIQUE PO) Take 1 tablet by mouth  daily. For cholesterol   Multiple Vitamins-Minerals (BONEUP PO) Take 2 capsules by mouth 2 (two) times daily.   Multiple Vitamins-Minerals (OCUVITE PO) Take 1 tablet by mouth daily.    Omega-3 1000 MG CAPS Take 2,000 mg by mouth 2 (two) times daily.   omeprazole (PRILOSEC) 20 MG capsule Take 20 mg by mouth daily.   Probiotic CAPS Take 1 capsule by mouth daily.    zinc  gluconate 50 MG tablet Take 50 mg by mouth daily.    [DISCONTINUED] amLODipine  (NORVASC ) 5 MG tablet Take 5 mg by mouth daily.   [DISCONTINUED] metoprolol  succinate (TOPROL -XL) 50 MG 24 hr tablet TAKE 1 TABLET(50 MG) BY MOUTH TWICE DAILY     Allergies:   Cortisone and Ferrous sulfate   Social History   Socioeconomic History   Marital status: Married    Spouse name: Carlin Null   Number of children: 0   Years of education: Not on file   Highest education level:  Some college, no degree  Occupational History   Occupation: retired  Tobacco Use   Smoking status: Never   Smokeless tobacco: Never  Vaping Use   Vaping status: Never Used  Substance and Sexual Activity   Alcohol use: Yes    Comment: rare   Drug use: Never   Sexual activity: Not Currently    Birth control/protection: Surgical  Other Topics Concern   Not on file  Social History Narrative   Not on file   Social Drivers of Health   Financial Resource Strain: Low Risk  (02/19/2023)   Overall Financial Resource Strain (CARDIA)    Difficulty of Paying Living Expenses: Not hard at all  Food Insecurity: No Food Insecurity (03/06/2023)   Hunger Vital Sign    Worried About Running Out of Food in the Last Year: Never true    Ran Out of Food in the Last Year: Never true  Transportation Needs: No Transportation Needs (03/06/2023)   PRAPARE - Administrator, Civil Service (Medical): No    Lack of Transportation (Non-Medical): No  Physical Activity: Inactive (02/19/2023)   Exercise Vital Sign    Days of Exercise per Week: 0 days    Minutes of Exercise per Session: 0 min  Stress: No Stress Concern Present (11/26/2022)   Received from [facility] of Occupational Health - Occupational Stress Questionnaire    Feeling of Stress : Not at all  Social Connections: Moderately Isolated (02/19/2023)   Social Connection and Isolation Panel    Frequency of Communication with Friends and Family: Three times a week    Frequency of Social Gatherings with Friends and Family: Three times a week    Attends Religious Services: Never    Active Member of Clubs or Organizations: No    Attends Banker Meetings: Never    Marital Status: Married     Family History: The patient's family history includes Alcohol abuse in her maternal grandfather; Emphysema in her brother; Hearing loss in her brother; Hypertension in her mother; Kidney failure in her father; Stroke in  her mother; Throat cancer in her paternal grandmother; Tuberculosis in her maternal grandmother; Valvular heart disease in her brother. ROS:   Please see the history of present illness.    All 14 point review of systems negative except as described per history of present illness.  EKGs/Labs/Other Studies Reviewed:    The following studies were reviewed today:   EKG:  EKG Interpretation Date/Time:  Tuesday February 02 2024 15:18:49 EST Ventricular Rate:  112 PR Interval:  122 QRS Duration:  68 QT Interval:  314 QTC Calculation: 428 R Axis:   76  Text Interpretation: Sinus tachycardia Low voltage QRS When compared with ECG of 27-Mar-2023 13:06, No significant change was found Confirmed by Liborio Hai reddy [phone]) on 02/02/2024 3:31:04 PM    Recent Labs: 01/12/2024: ALT <5; BUN 19; Creatinine 0.95; Hemoglobin 7.5; Platelet Count 412; Potassium 3.5; Sodium 143  Recent Lipid Panel    Component Value Date/Time   CHOL 180 11/25/2022 0921   TRIG 73 11/25/2022 0921   HDL 90 11/25/2022 0921   CHOLHDL 2.0 11/25/2022 0921   LDLCALC 76 11/25/2022 0921    Physical Exam:    VS:  BP (!) 112/58   Pulse (!) 112   Ht 5' 1 (1.549 m)   Wt 119 lb (54 kg)   SpO2 98%   BMI 22.48 kg/m     Wt Readings from Last 3 Encounters:  02/02/24 119 lb (54 kg)  01/04/24 126 lb (57.2 kg)  11/18/23 126 lb 8 oz (57.4 kg)     GENERAL:  Well nourished, well developed in no acute distress NECK: No JVD; No carotid bruits CARDIAC: RRR, S1 and S2 present, no murmurs, no rubs, no gallops CHEST:  Clear to auscultation without rales, wheezing or rhonchi  Extremities: No pitting pedal edema. Pulses bilaterally symmetric with radial 2+ and dorsalis pedis 2+ NEUROLOGIC:  Alert and oriented x 3  Medication Adjustments/Labs and Tests Ordered: Current medicines are reviewed at length with the patient today.  Concerns regarding medicines are outlined above.  Orders Placed This Encounter  Procedures    EKG 12-Lead   Meds ordered this encounter  Medications   metoprolol  succinate (TOPROL -XL) 50 MG 24 hr tablet    Sig: Take 1 tablet (50 mg total) by mouth in the morning and at bedtime. Take with or immediately following a meal.    Dispense:  180 tablet    Refill:  3    Pt must schedule a followup appt with Cardiology in November 2025 for any more refills. [phone] Thank You    Signed, Hai reddy Hisayo Delossantos, MD, MPH, [facility]. 02/02/2024 4:17 PM    [facility]

## 2024-02-02 NOTE — Patient Instructions (Addendum)
 Medication Instructions:  Your physician has recommended you make the following change in your medication:  Stop taking Amlodipine     *If you need a refill on your cardiac medications before your next appointment, please call your pharmacy*   Lab Work: None ordered If you have labs (blood work) drawn today and your tests are completely normal, you will receive your results only by: MyChart Message (if you have MyChart) OR A paper copy in the mail If you have any lab test that is abnormal or we need to change your treatment, we will call you to review the results.   Testing/Procedures: None ordered   Follow-Up: At [facility], you and your health needs are our priority.  As part of our continuing mission to provide you with exceptional heart care, we have created designated Provider Care Teams.  These Care Teams include your primary Cardiologist (physician) and Advanced Practice Providers (APPs -  Physician Assistants and Nurse Practitioners) who all work together to provide you with the care you need, when you need it.  We recommend signing up for the patient portal called MyChart.  Sign up information is provided on this After Visit Summary.  MyChart is used to connect with patients for Virtual Visits (Telemedicine).  Patients are able to view lab/test results, encounter notes, upcoming appointments, etc.  Non-urgent messages can be sent to your provider as well.   To learn more about what you can do with MyChart, go to [email or web address].    Your next appointment:   3 month(s)  The format for your next appointment:   In Person  Provider:   Alean Kobus, MD    Other Instructions none  Important Information About Sugar

## 2024-02-02 NOTE — Assessment & Plan Note (Signed)
 Associated with mitral annular calcification and mild mitral valve prolapse reported on echocardiograms in the past last echocardiogram September 2024 at [facility].  Reviewed the findings with her and I explained that this degree of mitral regurgitation is not expected to cause any symptoms and we can follow-up with repeat imaging tentatively in 1 year.

## 2024-02-02 NOTE — Progress Notes (Signed)
 Date of COVID positive in last 90 days:  PCP - Harrie Cedar, FNP Cardiologist - Jennifer Crape, MD  Chest x-ray - 03-27-23 Epic EKG - 03-30-23 Epic Stress Test - N/A ECHO - 11-27-22 [facility] Cardiac Cath - N/A Long Term Monitor - 12-31-22 Epic Pacemaker/ICD device last checked:N/A Spinal Cord Stimulator:N/A  Bowel Prep - Yes,   Sleep Study - N/A CPAP -   Fasting Blood Sugar - N/A Checks Blood Sugar _____ times a day  Last dose of GLP1 agonist-  N/A GLP1 instructions:  Do not take after     Last dose of SGLT-2 inhibitors-  N/A SGLT-2 instructions:  Do not take after     Blood Thinner Instructions: N/A Last dose:   Time: Aspirin Instructions:N/A Last Dose:  Activity level:  Can go up a flight of stairs and perform activities of daily living without stopping and without symptoms of chest pain or shortness of breath.  Able to exercise without symptoms  Unable to go up a flight of stairs without symptoms of     Anesthesia review: CAD, Afib, HTN  Patient denies shortness of breath, fever, cough and chest pain at PAT appointment  Patient verbalized understanding of instructions that were given to them at the PAT appointment. Patient was also instructed that they will need to review over the PAT instructions again at home before surgery.

## 2024-02-02 NOTE — Assessment & Plan Note (Signed)
 Remote history  of cardiac cath without any obstructive disease, based on cardiac catheter at [facility] several years ago, reports of this not available.  Mild coronary atherosclerosis on CT chest imaging. Currently not on aspirin or other antiplatelets given her anemia and per her personal preference.  No active anginal symptoms. No ischemic changes on EKG. Okay to proceed with her elective surgery as being planned.

## 2024-02-02 NOTE — Assessment & Plan Note (Signed)
 Last lipid panel reviewed from September 2024 with LDL 76, HDL 90, total cholesterol 819 and triglycerides 73. Takes fish oil. Not on any statins.  Prefers not to be on.

## 2024-02-03 ENCOUNTER — Other Ambulatory Visit: Payer: Self-pay

## 2024-02-03 ENCOUNTER — Observation Stay (HOSPITAL_COMMUNITY)
Admission: EM | Admit: 2024-02-03 | Discharge: 2024-02-07 | DRG: 330 | Disposition: A | Attending: General Surgery | Admitting: General Surgery

## 2024-02-03 ENCOUNTER — Encounter (HOSPITAL_COMMUNITY): Payer: Self-pay | Admitting: Internal Medicine

## 2024-02-03 ENCOUNTER — Encounter (HOSPITAL_COMMUNITY): Payer: Self-pay

## 2024-02-03 ENCOUNTER — Encounter (HOSPITAL_COMMUNITY)
Admission: RE | Admit: 2024-02-03 | Discharge: 2024-02-03 | Disposition: A | Source: Ambulatory Visit | Attending: General Surgery

## 2024-02-03 VITALS — BP 115/68 | HR 110 | Temp 98.0°F | Resp 18 | Ht 61.0 in | Wt 119.0 lb

## 2024-02-03 DIAGNOSIS — C189 Malignant neoplasm of colon, unspecified: Secondary | ICD-10-CM | POA: Diagnosis present

## 2024-02-03 DIAGNOSIS — D649 Anemia, unspecified: Principal | ICD-10-CM | POA: Diagnosis present

## 2024-02-03 DIAGNOSIS — I10 Essential (primary) hypertension: Secondary | ICD-10-CM

## 2024-02-03 DIAGNOSIS — D500 Iron deficiency anemia secondary to blood loss (chronic): Secondary | ICD-10-CM

## 2024-02-03 DIAGNOSIS — I480 Paroxysmal atrial fibrillation: Secondary | ICD-10-CM | POA: Diagnosis present

## 2024-02-03 DIAGNOSIS — Z8679 Personal history of other diseases of the circulatory system: Secondary | ICD-10-CM

## 2024-02-03 DIAGNOSIS — Z01818 Encounter for other preprocedural examination: Secondary | ICD-10-CM

## 2024-02-03 DIAGNOSIS — E785 Hyperlipidemia, unspecified: Secondary | ICD-10-CM | POA: Diagnosis present

## 2024-02-03 HISTORY — DX: Personal history of urinary calculi: Z87.442

## 2024-02-03 HISTORY — DX: Anemia, unspecified: D64.9

## 2024-02-03 LAB — CBC WITH DIFFERENTIAL/PLATELET
Abs Immature Granulocytes: 0.03 K/uL (ref 0.00–0.07)
Basophils Absolute: 0 K/uL (ref 0.0–0.1)
Basophils Relative: 1 %
Eosinophils Absolute: 0 K/uL (ref 0.0–0.5)
Eosinophils Relative: 1 %
HCT: 20.2 % — ABNORMAL LOW (ref 36.0–46.0)
Hemoglobin: 6 g/dL — CL (ref 12.0–15.0)
Immature Granulocytes: 1 %
Lymphocytes Relative: 14 %
Lymphs Abs: 0.9 K/uL (ref 0.7–4.0)
MCH: 27.8 pg (ref 26.0–34.0)
MCHC: 29.7 g/dL — ABNORMAL LOW (ref 30.0–36.0)
MCV: 93.5 fL (ref 80.0–100.0)
Monocytes Absolute: 0.7 K/uL (ref 0.1–1.0)
Monocytes Relative: 10 %
Neutro Abs: 4.7 K/uL (ref 1.7–7.7)
Neutrophils Relative %: 73 %
Platelets: 439 K/uL — ABNORMAL HIGH (ref 150–400)
RBC: 2.16 MIL/uL — ABNORMAL LOW (ref 3.87–5.11)
RDW: 17.2 % — ABNORMAL HIGH (ref 11.5–15.5)
WBC: 6.3 K/uL (ref 4.0–10.5)
nRBC: 0 % (ref 0.0–0.2)

## 2024-02-03 LAB — BASIC METABOLIC PANEL WITH GFR
Anion gap: 10 (ref 5–15)
BUN: 26 mg/dL — ABNORMAL HIGH (ref 8–23)
CO2: 25 mmol/L (ref 22–32)
Calcium: 8.8 mg/dL — ABNORMAL LOW (ref 8.9–10.3)
Chloride: 105 mmol/L (ref 98–111)
Creatinine, Ser: 0.86 mg/dL (ref 0.44–1.00)
GFR, Estimated: >60 mL/min (ref >60)
Glucose, Bld: 109 mg/dL — ABNORMAL HIGH (ref 70–99)
Potassium: 3.8 mmol/L (ref 3.5–5.1)
Sodium: 140 mmol/L (ref 135–145)

## 2024-02-03 LAB — CBC
HCT: 22 % — ABNORMAL LOW (ref 36.0–46.0)
Hemoglobin: 6.4 g/dL — CL (ref 12.0–15.0)
MCH: 27.9 pg (ref 26.0–34.0)
MCHC: 29.1 g/dL — ABNORMAL LOW (ref 30.0–36.0)
MCV: 96.1 fL (ref 80.0–100.0)
Platelets: 460 K/uL — ABNORMAL HIGH (ref 150–400)
RBC: 2.29 MIL/uL — ABNORMAL LOW (ref 3.87–5.11)
RDW: 16.9 % — ABNORMAL HIGH (ref 11.5–15.5)
WBC: 6.6 K/uL (ref 4.0–10.5)
nRBC: 0 % (ref 0.0–0.2)

## 2024-02-03 LAB — COMPREHENSIVE METABOLIC PANEL WITH GFR
ALT: <5 U/L (ref 0–44)
AST: 20 U/L (ref 15–41)
Albumin: 3.2 g/dL — ABNORMAL LOW (ref 3.5–5.0)
Alkaline Phosphatase: 73 U/L (ref 38–126)
Anion gap: 12 (ref 5–15)
BUN: 26 mg/dL — ABNORMAL HIGH (ref 8–23)
CO2: 23 mmol/L (ref 22–32)
Calcium: 8.9 mg/dL (ref 8.9–10.3)
Chloride: 104 mmol/L (ref 98–111)
Creatinine, Ser: 0.86 mg/dL (ref 0.44–1.00)
GFR, Estimated: >60 mL/min (ref >60)
Glucose, Bld: 102 mg/dL — ABNORMAL HIGH (ref 70–99)
Potassium: 3.8 mmol/L (ref 3.5–5.1)
Sodium: 139 mmol/L (ref 135–145)
Total Bilirubin: <0.2 mg/dL (ref 0.0–1.2)
Total Protein: 6.5 g/dL (ref 6.5–8.1)

## 2024-02-03 LAB — IRON AND TIBC
Iron: 12 ug/dL — ABNORMAL LOW (ref 28–170)
Saturation Ratios: 6 % — ABNORMAL LOW (ref 10.4–31.8)
TIBC: 203 ug/dL — ABNORMAL LOW (ref 250–450)
UIBC: 191 ug/dL

## 2024-02-03 LAB — BPAM RBC
Blood Product Expiration Date: 202512202359
Unit Type and Rh: 5100

## 2024-02-03 LAB — RETICULOCYTES
Immature Retic Fract: 23.5 % — ABNORMAL HIGH (ref 2.3–15.9)
RBC.: 2.2 MIL/uL — ABNORMAL LOW (ref 3.87–5.11)
Retic Count, Absolute: 52.8 K/uL (ref 19.0–186.0)
Retic Ct Pct: 2.4 % (ref 0.4–3.1)

## 2024-02-03 LAB — TYPE AND SCREEN
ABO/RH(D): O POS
Antibody Screen: NEGATIVE
Unit division: 0

## 2024-02-03 LAB — PREPARE RBC (CROSSMATCH)

## 2024-02-03 LAB — FERRITIN: Ferritin: 60 ng/mL (ref 11–307)

## 2024-02-03 LAB — VITAMIN B12: Vitamin B-12: >4000 pg/mL — ABNORMAL HIGH (ref 180–914)

## 2024-02-03 LAB — FOLATE: Folate: 11.9 ng/mL (ref >5.9)

## 2024-02-03 MED ORDER — ALBUTEROL SULFATE (2.5 MG/3ML) 0.083% IN NEBU: 2.5000 mg | INHALATION_SOLUTION | RESPIRATORY_TRACT | Status: DC | PRN

## 2024-02-03 MED ORDER — METOPROLOL SUCCINATE ER 50 MG PO TB24
50.0000 mg | ORAL_TABLET | Freq: Two times a day (BID) | ORAL | Status: DC
  Administered 2024-02-04 – 2024-02-05 (×3): 50 mg via ORAL
  Filled 2024-02-03 (×3): qty 1

## 2024-02-03 MED ORDER — ACETAMINOPHEN 650 MG RE SUPP: 650.0000 mg | Freq: Four times a day (QID) | RECTAL | Status: DC | PRN

## 2024-02-03 MED ORDER — ONDANSETRON HCL 4 MG PO TABS: 4.0000 mg | ORAL_TABLET | Freq: Four times a day (QID) | ORAL | Status: DC | PRN

## 2024-02-03 MED ORDER — ONDANSETRON HCL 4 MG/2ML IJ SOLN
4.0000 mg | Freq: Four times a day (QID) | INTRAMUSCULAR | Status: DC | PRN
  Administered 2024-02-04: 4 mg via INTRAVENOUS
  Filled 2024-02-03: qty 2

## 2024-02-03 MED ORDER — SODIUM CHLORIDE 0.9% IV SOLUTION: Freq: Once | INTRAVENOUS | Status: DC

## 2024-02-03 MED ORDER — POLYETHYLENE GLYCOL 3350 17 G PO PACK: 17.0000 g | PACK | Freq: Every day | ORAL | Status: DC | PRN

## 2024-02-03 MED ORDER — ACETAMINOPHEN 325 MG PO TABS
650.0000 mg | ORAL_TABLET | Freq: Four times a day (QID) | ORAL | Status: DC | PRN
  Administered 2024-02-04 – 2024-02-05 (×2): 650 mg via ORAL
  Filled 2024-02-03: qty 2

## 2024-02-03 MED ORDER — SALINE SPRAY 0.65 % NA SOLN
1.0000 | NASAL | Status: DC | PRN
  Administered 2024-02-03: 1 via NASAL
  Filled 2024-02-03: qty 44

## 2024-02-03 MED ORDER — PANTOPRAZOLE SODIUM 40 MG PO TBEC
80.0000 mg | DELAYED_RELEASE_TABLET | Freq: Every day | ORAL | Status: DC
  Administered 2024-02-04 – 2024-02-06 (×2): 80 mg via ORAL
  Filled 2024-02-03 (×2): qty 2

## 2024-02-03 MED ORDER — SODIUM CHLORIDE 0.9% IV SOLUTION: Freq: Once | INTRAVENOUS | Status: AC

## 2024-02-03 MED ORDER — LORATADINE 10 MG PO TABS
10.0000 mg | ORAL_TABLET | Freq: Every day | ORAL | Status: DC
  Administered 2024-02-03 – 2024-02-04 (×2): 10 mg via ORAL
  Filled 2024-02-03 (×2): qty 1

## 2024-02-03 MED ORDER — LORATADINE 10 MG PO TABS: 10.0000 mg | ORAL_TABLET | Freq: Every day | ORAL | Status: DC

## 2024-02-03 NOTE — ED Provider Notes (Signed)
 [place] EMERGENCY DEPARTMENT AT [facility] Provider Note   CSN: [id number] Arrival date & time: 02/03/24  1411     Patient presents with: No chief complaint on file.   Andrea [name] is a 78 y.o. female history of adenocarcinoma of the colon, A-fib, iron  deficiency anemia, hypertension, hyperlipidemia presents emerged from today for evaluation of abnormal hemoglobin done for preop.  Patient scheduled for partial colectomy on 02-05-2024.  Today had preadmission lab work that showed hemoglobin of 6.4.  Patient reports that she has had multiple blood transfusions and iron  transfusions over the past year because of her anemia.  Has felt some fatigue but overall has no worsening of her general health condition.  Abdominal pain is symptoms at her baseline.  Not having any hematochezia or melena.  Denies any fevers.  No new rashes or bruises.  Denies any bleeding with brushing her teeth.  HPI     Prior to Admission medications   Medication Sig Start Date End Date Taking? Authorizing Provider  Ascorbic Acid (VITAMIN C) 1000 MG tablet Take 1,000 mg by mouth daily.    Yes [provider]  cetirizine (ZYRTEC) 10 MG tablet Take 10 mg by mouth 2 (two) times daily as needed for allergies.   Yes [provider]  CRANBERRY PO Take 8,400 mg by mouth daily. With vitamin C   Yes [provider]  Cyanocobalamin  (VITAMIN B-12 PO) Take 2,500 mcg by mouth daily. Dissolvable tablet   Yes [provider]  fluticasone (FLONASE) 50 MCG/ACT nasal spray Place 1 spray into both nostrils daily as needed for allergies or rhinitis.   Yes [provider]  Garlic (GARLIQUE PO) Take 1 tablet by mouth daily. For cholesterol   Yes [provider]  metoprolol  succinate (TOPROL -XL) 50 MG 24 hr tablet Take 1 tablet (50 mg total) by mouth in the morning and at bedtime. Take with or immediately following a meal. 02/02/24  Yes Madireddy, Alean SAUNDERS, MD  Multiple  Vitamins-Minerals (BONEUP PO) Take 2 capsules by mouth 2 (two) times daily.   Yes [provider]  Multiple Vitamins-Minerals (OCUVITE PO) Take 1 tablet by mouth daily.    Yes [provider]  Omega-3 1000 MG CAPS Take 2,000 mg by mouth 2 (two) times daily.   Yes [provider]  omeprazole (PRILOSEC) 20 MG capsule Take 20 mg by mouth daily.   Yes [provider]  Probiotic CAPS Take 1 capsule by mouth daily.    Yes [provider]  zinc  gluconate 50 MG tablet Take 50 mg by mouth daily.    Yes [provider]  amLODipine  (NORVASC ) 5 MG tablet Take 5 mg by mouth daily. Patient not taking: Reported on 02/03/2024    [provider]    Allergies: Cortisone and Ferrous sulfate    Review of Systems  Constitutional:  Positive for fatigue.  Respiratory:  Negative for shortness of breath.   Cardiovascular:  Negative for chest pain.  Gastrointestinal:  Positive for abdominal pain (at chronic state).  Neurological:  Negative for syncope.    Updated Vital Signs BP 110/71 (BP Location: Left Arm)   Pulse (!) 102   Temp 97.6 F (36.4 C) (Oral)   Resp 19   SpO2 100%   Physical Exam Vitals and nursing note reviewed.  Constitutional:      General: She is not in acute distress.    Appearance: She is not toxic-appearing.  HENT:     Mouth/Throat:  Mouth: Mucous membranes are moist.  Eyes:     General: No scleral icterus.    Comments: Pale conjunctiva  Pulmonary:     Effort: Pulmonary effort is normal. No respiratory distress.  Skin:    General: Skin is warm and dry.     Coloration: Skin is pale.  Neurological:     Mental Status: She is alert.     (all labs ordered are listed, but only abnormal results are displayed) Labs Reviewed  CBC WITH DIFFERENTIAL/PLATELET - Abnormal; Notable for the following components:      Result Value   RBC 2.16 (*)    Hemoglobin 6.0 (*)    HCT 20.2 (*)    MCHC 29.7 (*)    RDW 17.2 (*)     Platelets 439 (*)    All other components within normal limits  COMPREHENSIVE METABOLIC PANEL WITH GFR - Abnormal; Notable for the following components:   Glucose, Bld 102 (*)    BUN 26 (*)    Albumin 3.2 (*)    All other components within normal limits  CBC WITH DIFFERENTIAL/PLATELET  VITAMIN B12  FOLATE  IRON  AND TIBC  FERRITIN  RETICULOCYTES  CBC  POC OCCULT BLOOD, ED  PREPARE RBC (CROSSMATCH)  TYPE AND SCREEN    EKG: None  Radiology: No results found.  .Critical Care  Performed by: Bernis Ernst, PA-C Authorized by: Bernis Ernst, PA-C   Critical care provider statement:    Critical care time (minutes):  30   Critical care was necessary to treat or prevent imminent or life-threatening deterioration of the following conditions:  Circulatory failure   Critical care was time spent personally by me on the following activities:  Development of treatment plan with patient or surrogate, discussions with consultants, evaluation of patient's response to treatment, examination of patient, ordering and review of laboratory studies, ordering and review of radiographic studies, ordering and performing treatments and interventions, pulse oximetry, re-evaluation of patient's condition, review of old charts and obtaining history from patient or surrogate   Care discussed with: admitting provider   Comments:     Anemia requiring transfusion and admission    Medications Ordered in the ED  0.9 %  sodium chloride  infusion (Manually program via Guardrails IV Fluids) (has no administration in time range)  metoprolol  succinate (TOPROL -XL) 24 hr tablet 50 mg (has no administration in time range)  pantoprazole  (PROTONIX ) EC tablet 80 mg (has no administration in time range)  acetaminophen  (TYLENOL ) tablet 650 mg (has no administration in time range)    Or  acetaminophen  (TYLENOL ) suppository 650 mg (has no administration in time range)  polyethylene glycol (MIRALAX  / GLYCOLAX ) packet 17 g  (has no administration in time range)  ondansetron  (ZOFRAN ) tablet 4 mg (has no administration in time range)    Or  ondansetron  (ZOFRAN ) injection 4 mg (has no administration in time range)  albuterol  (PROVENTIL ) (2.5 MG/3ML) 0.083% nebulizer solution 2.5 mg (has no administration in time range)   Medical Decision Making Amount and/or Complexity of Data Reviewed Labs: ordered.  Risk Prescription drug management. Decision regarding hospitalization.   78 y.o. female presents to the ER for evaluation of low hemoglobin. Differential diagnosis includes but is not limited to anemia, lab error. Vital signs mild tachycardia otherwise unremarkable. Physical exam as noted above.   On previous right evaluation, patient sees hematology/oncology already for colon malignancy.  Known history of anemia and has required multiple blood transfusions and iron  transfusions before.  She had preop lab work  done today that showed a hemoglobin of 6.4.  I independently reviewed and interpreted the patient's labs.  CMP shows glucose of 102, BUN of 26 with an albumin of 3.2.  No other electrolyte or LFT abnormality.  CBC shows worsening hemoglobin at 6.0 which was 6.4 this morning.  Hematocrit 20.2.  Platelets of 439 with a normal white count.  Patient has known history of anemia has been dealing with this for a little over a year and has had multiple blood transfusions and IV transfusions.  She could not tolerate oral iron .  I have ordered her 1 unit of blood however she will likely need another unit.  The patient is scheduled for partial colectomy on the fifth with Dr. Debby.  I consulted Dr. Debby who thinks is reasonable to keep the patient admitted for bowel prep tomorrow for still scheduled appointment on the fifth given her anemia and other comorbidities.She will see her tomorrow as well.   Patient amenable to admission.  Tried hospitalist to admit.  Portions of this report may have been transcribed using  voice recognition software. Every effort was made to ensure accuracy; however, inadvertent computerized transcription errors may be present.    Final diagnoses:  Anemia, unspecified type    ED Discharge Orders     None          Bernis Ernst, PA-C 02/03/24 1845    Mannie Pac T, DO 02/08/24 [phone]

## 2024-02-03 NOTE — Progress Notes (Signed)
 Spoke to patient and advised her that she needs to go to the ER for a blood transfusion due to hemoglobin being 6.4.  Patient stated understanding and will come to [facility].

## 2024-02-03 NOTE — Progress Notes (Signed)
    PROCEDURAL EXPEDITER PROGRESS NOTE  Patient Name: Andrea [name]  DOB:Oct 21, 1945 Date of Admission: 02/03/2024  Date of Assessment:02/03/24   -------------------------------------------------------------------------------------------------------------------   Brief clinical summary: pt is an 78 yr old female hx of A-fib and colon cancer.  Pt scheduled for surgery on 02/05/2024 for colectomy .    Orders in place:  Yes   Communication with surgical team if no orders: n/a  Labs, test, and orders reviewed: yes  Requires surgical clearance:  No  What type of clearance: n/a  Clearance received: n/a  Barriers noted:Pt Hgb is 6.0 and is in the ER    Intervention provided by [facility] team: n/a  Barrier resolved:  no   -------------------------------------------------------------------------------------------------------------------  [employer], Andrea [name] Please contact us  directly via secure chat (search for [facility]) or by calling us  at [phone] [facility]).

## 2024-02-03 NOTE — H&P (Signed)
 History and Physical    Patient: Andrea [name] FMW:[id number] DOB: 1945-10-28 DOA: 02/03/2024 DOS: the patient was seen and examined on 02/03/2024 PCP: Teressa Harrie HERO, FNP  Patient coming from: Home  Chief Complaint: Abnormal labs, anemia  HPI: Andrea [name] is a 78 y.o. female with medical history significant of paroxysmal atrial fibrillation, HTN, cardiomyopathy with recovered LVEF, chronic diastolic congestive heart failure, HLD, GERD, left breast cancer s/p right lumpectomy and radiation therapy, mild mitral regurgitation with mitral annular calcification/mitral valve prolapse, CAD, aortic atherosclerosis, and recent diagnosis of colon adenocarcinoma who presented to [facility] ED on 02/03/2024 by direction of outpatient general surgery office for hemoglobin of 6.4.  Patient recently diagnosed with colon adenocarcinoma with plans partial colectomy on 02/05/2024 with outpatient preop labs concerning for anemia with hemoglobin 6.4.  Patient endorses some mild dyspnea on exertion but otherwise no complaints.  Denies headache, no fever/chills/night sweats, no nausea/vomit/diarrhea, no focal weakness, no chest pain, no palpitations, no shortness of breath at rest, no paresthesias.  In the ED, temperature 97.6 F, HR 102, RR 19, BP 110/71, SpO2 100% on room air.  WBC 6.6, hemoglobin 6.4, platelet count 460.  Sodium 140, potassium 3.8, chloride 105, CO2 25, glucose 109, BUN 26, creatinine 0.86.  EDP ordered 1 unit PRBC for transfusion.  TRH consulted for admission for further evaluation and management of anemia secondary to colon mass/adenocarcinoma.    Review of Systems: As mentioned in the history of present illness. All other systems reviewed and are negative. 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   Dyslipidemia 11/26/2022   Essential hypertension 12/28/2014   GERD (gastroesophageal reflux disease)    Headache    r/t sinus   Hearing loss 01/20/2022   History of cardiomyopathy 10/27/2017   History of kidney stones    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   Hyperlipemia 12/22/2016   Hyperlipidemia    Hypertension    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   11/2018-High grade-lumpectomy-radiation 02/2019-03/14/2019   Mixed hyperlipidemia 01/20/2022   Palpitations    Paroxysmal A-fib (HCC) 11/2022   Personal history of radiation therapy    Past Surgical History:  Procedure Laterality Date   ABDOMINAL HYSTERECTOMY     with reconstructive surgery   BREAST LUMPECTOMY Left    BREAST LUMPECTOMY WITH RADIOACTIVE SEED LOCALIZATION Left 01/04/2019   Procedure: LEFT BREAST LUMPECTOMY WITH RADIOACTIVE SEED LOCALIZATION;  Surgeon: Vanderbilt Ned, MD;  Location: [facility];  Service: General;  Laterality: Left;   COLONOSCOPY     INNER EAR SURGERY Left    RE-EXCISION OF BREAST LUMPECTOMY Left 01/18/2019   Procedure: RE-EXCISION OF LEFT BREAST LUMPECTOMY;  Surgeon: Vanderbilt Ned, MD;  Location: [facility];  Service: General;  Laterality: Left;   ROTATOR CUFF REPAIR Right    WRIST SURGERY Right    cyst removal   Social History:  reports that she has never smoked. She has never used smokeless tobacco. She reports current alcohol use. She reports that she does not use drugs.  Allergies  Allergen Reactions   Cortisone Rash    States allegic to oral Cortisone, but not to shot   Ferrous Sulfate Other (See Comments)    Abdominal pains    Family History  Problem Relation Age of Onset   Stroke Mother    Hypertension Mother    Kidney failure Father         Neuremia kidney poisoning   Valvular heart disease Brother    Emphysema Brother    Hearing loss Brother        hearing aids   Tuberculosis Maternal Grandmother    Alcohol abuse Maternal Grandfather        phlebitis   Throat cancer Paternal Grandmother     Prior to Admission medications   Medication Sig Start Date End Date Taking? Authorizing Provider  amLODipine  (NORVASC ) 5 MG tablet Take 5 mg by mouth daily. Patient not taking: Reported on 02/03/2024    [provider]  Ascorbic Acid (VITAMIN C) 1000 MG tablet Take 1,000 mg by mouth daily.     [provider]  cetirizine (ZYRTEC) 10 MG tablet Take 10 mg by mouth 2 (two) times daily as needed for allergies.    [provider]  CRANBERRY PO Take 8,400 mg by mouth daily. With vitamin C    [provider]  Cyanocobalamin  (VITAMIN B-12 PO) Take 2,500 mcg by mouth daily. Dissolvable tablet    [provider]  fluticasone (FLONASE) 50 MCG/ACT nasal spray Place 1 spray into both nostrils daily as needed for allergies or rhinitis.    [provider]  Garlic (GARLIQUE PO) Take 1 tablet by mouth daily. For cholesterol    [provider]  metoprolol  succinate (TOPROL -XL) 50 MG 24 hr tablet Take 1 tablet (50 mg total) by mouth in the morning and at bedtime. Take with or immediately following a meal. 02/02/24   Madireddy, Alean SAUNDERS, MD  Multiple Vitamins-Minerals (BONEUP PO) Take 2 capsules by mouth 2 (two) times daily.    [provider]  Multiple Vitamins-Minerals (OCUVITE PO) Take 1 tablet by mouth daily.     [provider]  Omega-3 1000 MG CAPS Take 2,000 mg by mouth 2 (two) times daily.    [provider]  omeprazole (PRILOSEC) 20 MG capsule Take 20 mg by mouth daily.    [provider]  Probiotic CAPS Take 1 capsule by mouth daily.     [provider]  zinc  gluconate 50 MG tablet Take 50 mg by mouth daily.     [provider]    Physical Exam: Vitals:   02/03/24 1432  BP: 110/71  Pulse: (!) 102  Resp: 19  Temp: 97.6 F (36.4 C)  TempSrc: Oral  SpO2: 100%   Physical Exam GEN: 78 yo female in NAD, alert and oriented x 3, wd/wn HEENT: NCAT, PERRL, EOMI, sclera clear, MMM PULM: CTAB w/o wheezes/crackles, normal respiratory effort, on room air with SpO2 100% at rest CV: RRR w/o M/G/R GI: abd soft, NTND, + BS MSK: no peripheral edema, muscle strength globally intact 5/5 bilateral upper/lower extremities NEURO: CN II-XII intact, no focal deficits, sensation to light touch intact PSYCH: normal mood/affect Integumentary: dry/intact, no rashes or wounds   Assessment and Plan:  Anemia secondary to chronic blood loss from newly diagnosed colon adenocarcinoma Patient presenting to ED by direction of general surgery office outpatient for hemoglobin of 6.4.  Recently diagnosed with colon adenocarcinoma with planned surgical intervention scheduled on 02/05/2024.  Hemoglobin 6.4 at  outpatient office.  Patient with symptoms of mild dyspnea with exertion, otherwise asymptomatic. - -Place in observation, telemetry status -- Check anemia panel (patient reports stomach upset with prior use of oral iron tablets and during last IV iron infusion) -- Transfuse 1 unit PRBC; repeat H&H 2 hours following transfusion -- EDP discussed with general surgery, Dr. Debby regarding upcoming surgery as patient was planned to start bowel prep tomorrow 12/4 (planned partial colectomy 12/5)  HTN Hx paroxysmal atrial fibrillation not on anticoagulation Cardiomyopathy with improved LVEF Grade 1 diastolic dysfunction Follows with cardiology outpatient, recently seen by Dr. Liborio on 02/02/2024.  TTE 03/24/2022 with LVEF 60 to 65%, LV with no regional wall motion abnormalities, grade 1 diastolic dysfunction, mild MR, mild prolapse of mitral valve.  Amlodipine  recently discontinued by cardiology. -- Continue metoprolol  succinate 50 mg p.o.  twice daily -- Monitor on telemetry  GERD -- Protonix 80 mg PO daily (substituted for home Nexium)     Advance Care Planning:   Code Status: Full Code   Consults: EDP discussed with general surgery, Dr. Debby  Family Communication: Spouse present at bedside  Severity of Illness: The appropriate patient status for this patient is OBSERVATION. Observation status is judged to be reasonable and necessary in order to provide the required intensity of service to ensure the patient's safety. The patient's presenting symptoms, physical exam findings, and initial radiographic and laboratory data in the context of their medical condition is felt to place them at decreased risk for further clinical deterioration. Furthermore, it is anticipated that the patient will be medically stable for discharge from the hospital within 2 midnights of admission.   Author: Camellia PARAS Rasheen Schewe, DO 02/03/2024 3:53 PM  For on call review [email or web address].

## 2024-02-03 NOTE — ED Triage Notes (Signed)
 Arrived POV from home. Patient called to come to ED because her hemoglobin is 6.4. patient ambulated to room holding my arm. A&O X4. NAD

## 2024-02-03 NOTE — Progress Notes (Signed)
 Spoke with Sari at Dr. Debby office and advised that patient's hemoglobin is 6.4 and they [facility], PA-C has recommended that patient go to the ER.

## 2024-02-03 NOTE — ED Notes (Signed)
Gave pt drink and snack  

## 2024-02-04 DIAGNOSIS — D649 Anemia, unspecified: Secondary | ICD-10-CM | POA: Diagnosis present

## 2024-02-04 DIAGNOSIS — D500 Iron deficiency anemia secondary to blood loss (chronic): Secondary | ICD-10-CM | POA: Diagnosis present

## 2024-02-04 LAB — CBC
HCT: 24.1 % — ABNORMAL LOW (ref 36.0–46.0)
Hemoglobin: 7.8 g/dL — ABNORMAL LOW (ref 12.0–15.0)
MCH: 28.7 pg (ref 26.0–34.0)
MCHC: 32.4 g/dL (ref 30.0–36.0)
MCV: 88.6 fL (ref 80.0–100.0)
Platelets: 408 K/uL — ABNORMAL HIGH (ref 150–400)
RBC: 2.72 MIL/uL — ABNORMAL LOW (ref 3.87–5.11)
RDW: 17.1 % — ABNORMAL HIGH (ref 11.5–15.5)
WBC: 5.3 K/uL (ref 4.0–10.5)
nRBC: 0 % (ref 0.0–0.2)

## 2024-02-04 LAB — HEMOGLOBIN AND HEMATOCRIT, BLOOD
HCT: 23.8 % — ABNORMAL LOW (ref 36.0–46.0)
HCT: 32.5 % — ABNORMAL LOW (ref 36.0–46.0)
Hemoglobin: 7.8 g/dL — ABNORMAL LOW (ref 12.0–15.0)
Hemoglobin: 10.5 g/dL — ABNORMAL LOW (ref 12.0–15.0)

## 2024-02-04 LAB — PREPARE RBC (CROSSMATCH)

## 2024-02-04 MED ORDER — NEOMYCIN SULFATE 500 MG PO TABS
500.0000 mg | ORAL_TABLET | Freq: Once | ORAL | Status: AC
  Administered 2024-02-04: 500 mg via ORAL
  Filled 2024-02-04: qty 1

## 2024-02-04 MED ORDER — METRONIDAZOLE 500 MG PO TABS
500.0000 mg | ORAL_TABLET | Freq: Once | ORAL | Status: AC
  Administered 2024-02-04: 500 mg via ORAL
  Filled 2024-02-04: qty 1

## 2024-02-04 MED ORDER — POLYETHYLENE GLYCOL 3350 17 GM/SCOOP PO POWD
238.0000 g | Freq: Once | ORAL | Status: AC
  Administered 2024-02-04: 238 g via ORAL
  Filled 2024-02-04: qty 238

## 2024-02-04 MED ORDER — IRON SUCROSE 200 MG IVPB - SIMPLE MED
200.0000 mg | Freq: Once | Status: AC
  Administered 2024-02-04: 200 mg via INTRAVENOUS
  Filled 2024-02-04: qty 110

## 2024-02-04 MED ORDER — SODIUM CHLORIDE 0.9% IV SOLUTION: Freq: Once | INTRAVENOUS | Status: AC

## 2024-02-04 MED ORDER — BISACODYL 5 MG PO TBEC
10.0000 mg | DELAYED_RELEASE_TABLET | Freq: Once | ORAL | Status: AC
  Administered 2024-02-04: 10 mg via ORAL
  Filled 2024-02-04: qty 2

## 2024-02-04 NOTE — Progress Notes (Signed)
   02/04/24 1612  TOC Brief Assessment  Insurance and Status Reviewed  Patient has primary care physician Yes Marianne, Harrie HERO, FNP)  Home environment has been reviewed Yes from home with spouse  Prior level of function: Independent  Prior/Current Home Services No current home services  Social Drivers of Health Review SDOH reviewed no interventions necessary  Readmission risk has been reviewed Yes  Transition of care needs no transition of care needs at this time

## 2024-02-04 NOTE — Plan of Care (Signed)

## 2024-02-04 NOTE — Progress Notes (Signed)
 PROGRESS NOTE    Andrea [name]  FMW:[id number] DOB: January 11, 1946 DOA: 02/03/2024 PCP: Teressa Harrie HERO, FNP    Brief Narrative:   Andrea [name] is a 78 y.o. female with past medical history significant for paroxysmal atrial fibrillation, HTN, cardiomyopathy with recovered LVEF, chronic diastolic congestive heart failure, HLD, GERD, left breast cancer s/p right lumpectomy and radiation therapy, mild mitral regurgitation with mitral annular calcification/mitral valve prolapse, CAD, aortic atherosclerosis, and recent diagnosis of colon adenocarcinoma who presented to [facility] ED on 02/03/2024 by direction of outpatient general surgery office for hemoglobin of 6.4.  Patient recently diagnosed with colon adenocarcinoma with plans partial colectomy on 02/05/2024 with outpatient preop labs concerning for anemia with hemoglobin 6.4.  Patient endorses some mild dyspnea on exertion but otherwise no complaints.  Denies headache, no fever/chills/night sweats, no nausea/vomit/diarrhea, no focal weakness, no chest pain, no palpitations, no shortness of breath at rest, no paresthesias.   In the ED, temperature 97.6 F, HR 102, RR 19, BP 110/71, SpO2 100% on room air.  WBC 6.6, hemoglobin 6.4, platelet count 460.  Sodium 140, potassium 3.8, chloride 105, CO2 25, glucose 109, BUN 26, creatinine 0.86.  EDP ordered 1 unit PRBC for transfusion.  TRH consulted for admission for further evaluation and management of anemia secondary to colon mass/adenocarcinoma.  Assessment & Plan:   Anemia secondary to chronic blood loss from newly diagnosed colon adenocarcinoma Patient presenting to ED by direction of general surgery office outpatient for hemoglobin of 6.4.  Recently diagnosed with colon adenocarcinoma with planned surgical intervention scheduled on 02/05/2024.  Hemoglobin 6.4 at outpatient office.  Patient with symptoms of mild dyspnea with exertion, otherwise asymptomatic.  Anemia panel with iron 12, TIBC 303,  ferritin 60, B12 greater than 4000. -- Hgb 6.0>7.8 -- S/p 1 unit PRBC 12/3 -- IV iron x 1 today -- Repeat H&H this afternoon -- Transfuse for hemoglobin less than 7.0 -- General Surgery following, clear liquid diet, bowel prep today for anticipated surgical resection of colon mass tomorrow 12/5, n.p.o. after midnight   HTN Hx paroxysmal atrial fibrillation not on anticoagulation Cardiomyopathy with improved LVEF Grade 1 diastolic dysfunction Follows with cardiology outpatient, recently seen by Dr. Liborio on 02/02/2024.  TTE 03/24/2022 with LVEF 60 to 65%, LV with no regional wall motion abnormalities, grade 1 diastolic dysfunction, mild MR, mild prolapse of mitral valve.  Amlodipine  recently discontinued by cardiology. -- Continue metoprolol  succinate 50 mg p.o. twice daily -- Monitor on telemetry   GERD -- Protonix 80 mg PO daily (substituted for home Nexium)   DVT prophylaxis: SCDs Start: 02/03/24 1541    Code Status: Full Code Family Communication: No family present at bedside this morning  Disposition Plan:  Level of care: Telemetry Status is: Observation The patient remains OBS appropriate and will d/c before 2 midnights.    Consultants:  General surgery, Dr. Debby  Procedures:  None  Antimicrobials:  None   Subjective: Patient seen examined bedside, lying in bed.  RN present at bedside.  No complaints this morning.  Seen by general surgery with plan for bowel prep today with planned surgical resection of colon mass tomorrow.  Hemoglobin up to 7.8, will continue to monitor closely and repeat transfusions as needed.  No other questions or concerns at this time.  Denies headache, no dizziness, no chest pain, no palpitations, no shortness of breath, no abdominal pain, no fever/chills/night sweats, no nausea cefonicid diarrhea, no focal weakness, no fatigue, no paresthesia.  No acute events overnight  per nurse staff.  Objective: Vitals:   02/04/24 0347 02/04/24 0500  02/04/24 0751 02/04/24 0802  BP: 116/71  118/69 118/69  Pulse: 94  99 99  Resp: 18  (!) 21   Temp: (!) 97.3 F (36.3 C)  97.6 F (36.4 C)   TempSrc: Oral  Oral   SpO2: 97%  98%   Weight:  54.2 kg    Height:  5' 1 (1.549 m)      Intake/Output Summary (Last 24 hours) at 02/04/2024 1206 Last data filed at 02/04/2024 1115 Gross per 24 hour  Intake 868 ml  Output --  Net 868 ml   Filed Weights   02/04/24 0500  Weight: 54.2 kg    Examination:  Physical Exam: GEN: NAD, alert and oriented x 3, wd/wn HEENT: NCAT, PERRL, EOMI, sclera clear, MMM PULM: CTAB w/o wheezes/crackles, normal respiratory effort, on room air with SpO2 97% at rest CV: RRR w/o M/G/R GI: abd soft, NTND, + BS MSK: no peripheral edema, muscle strength globally intact 5/5 bilateral upper/lower extremities NEURO: CN II-XII intact, no focal deficits, sensation to light touch intact PSYCH: normal mood/affect Integumentary: dry/intact, no rashes or wounds    Data Reviewed: I have personally reviewed following labs and imaging studies  CBC: Recent Labs  Lab 02/03/24 0925 02/03/24 1449 02/04/24 0149  WBC 6.6 6.3 5.3  NEUTROABS  --  4.7  --   HGB 6.4* 6.0* 7.8*  HCT 22.0* 20.2* 24.1*  MCV 96.1 93.5 88.6  PLT 460* 439* 408*   Basic Metabolic Panel: Recent Labs  Lab 02/03/24 0925 02/03/24 1449  NA 140 139  K 3.8 3.8  CL 105 104  CO2 25 23  GLUCOSE 109* 102*  BUN 26* 26*  CREATININE 0.86 0.86  CALCIUM 8.8* 8.9   GFR: Estimated Creatinine Clearance: 39.4 mL/min (by C-G formula based on SCr of 0.86 mg/dL). Liver Function Tests: Recent Labs  Lab 02/03/24 1449  AST 20  ALT <5  ALKPHOS 73  BILITOT <0.2  PROT 6.5  ALBUMIN 3.2*   No results for input(s): LIPASE, AMYLASE in the last 168 hours. No results for input(s): AMMONIA in the last 168 hours. Coagulation Profile: No results for input(s): INR, PROTIME in the last 168 hours. Cardiac Enzymes: No results for input(s): CKTOTAL,  CKMB, CKMBINDEX, TROPONINI in the last 168 hours. BNP (last 3 results) No results for input(s): PROBNP in the last 8760 hours. HbA1C: No results for input(s): HGBA1C in the last 72 hours. CBG: No results for input(s): GLUCAP in the last 168 hours. Lipid Profile: No results for input(s): CHOL, HDL, LDLCALC, TRIG, CHOLHDL, LDLDIRECT in the last 72 hours. Thyroid  Function Tests: No results for input(s): TSH, T4TOTAL, FREET4, T3FREE, THYROIDAB in the last 72 hours. Anemia Panel: Recent Labs    02/03/24 1903  VITAMINB12 >4,000*  FOLATE 11.9  FERRITIN 60  TIBC 203*  IRON  12*  RETICCTPCT 2.4   Sepsis Labs: No results for input(s): PROCALCITON, LATICACIDVEN in the last 168 hours.  No results found for this or any previous visit (from the past 240 hours).       Radiology Studies: No results found.      Scheduled Meds:  sodium chloride    Intravenous Once   bisacodyl   10 mg Oral Once   loratadine   10 mg Oral Daily   metoprolol  succinate  50 mg Oral BID   metroNIDAZOLE   500 mg Oral Once   metroNIDAZOLE   500 mg Oral Once   metroNIDAZOLE   500  mg Oral Once   neomycin  500 mg Oral Once   neomycin  500 mg Oral Once   neomycin  500 mg Oral Once   pantoprazole  80 mg Oral Daily   polyethylene glycol powder  238 g Oral Once   Continuous Infusions:   LOS: 0 days    Time spent: 51 minutes spent on 02/04/2024 caring for this patient face-to-face including chart review, ordering labs/tests, documenting, discussion with nursing staff, consultants, updating family and interview/physical exam    Camellia PARAS Aliyah Abeyta, DO [facility] Available via Epic secure chat 7am-7pm After these hours, please refer to coverage provider listed on [email or web address] 02/04/2024, 12:06 PM

## 2024-02-04 NOTE — Progress Notes (Signed)
   02/04/24 2131  What Happened  Was fall witnessed? Yes  Who witnessed fall? Deleta Savers, RN and Daish, NT  Patients activity before fall ambulating-unassisted (Patient was getting up from [facility])  Point of contact buttocks;hip/leg;head  Was patient injured? Unsure  Provider Notification  Provider Name/Title Jerelene Bunde, NP  Date Provider Notified 02/04/24  Time Provider Notified 2129  Method of Notification Page (Secure Chat)  Notification Reason Fall  Follow Up  Family notified Yes - comment  Time family notified 2136  Adult Fall Risk Assessment  Risk Factor Category (scoring not indicated) Fall has occurred during this admission (document High fall risk)  Age 1  Fall History: Fall within 6 months prior to admission 0  Elimination; Bowel and/or Urine Incontinence 0  Elimination; Bowel and/or Urine Urgency/Frequency 0  Medications: includes PCA/Opiates, Anti-convulsants, Anti-hypertensives, Diuretics, Hypnotics, Laxatives, Sedatives, and Psychotropics 3  Patient Care Equipment 1  Mobility-Assistance 0  Mobility-Gait 0  Mobility-Sensory Deficit 0  Altered awareness of immediate physical environment 0  Impulsiveness 0  Lack of understanding of one's physical/cognitive limitations 0  Total Score 7  Patient Fall Risk Level High fall risk  Adult Fall Risk Interventions  Required Bundle Interventions *See Row Information* High fall risk  Vitals  Temp 98.3 F (36.8 C)  Temp Source Oral  BP 123/81  MAP (mmHg) 95  BP Location Left Arm  BP Method Automatic  Patient Position (if appropriate) Lying  Pulse Rate (!) 115  Pulse Rate Source Monitor  Oxygen Therapy  SpO2 99 %  O2 Device Room Air  Pain Assessment  Pain Scale 0-10  Pain Score 5  Pain Type Acute pain  Pain Location Hip  Pain Orientation Right  PCA/Epidural/Spinal Assessment  Respiratory Pattern Regular;Unlabored  Neurological  Neuro (WDL) WDL  Level of Consciousness Alert  Orientation Level Oriented X4   Musculoskeletal  Musculoskeletal (WDL) X  Integumentary  Integumentary (WDL) WDL   Was the fall witnessed: Yes  Patient condition before and after the fall: Patient is stable (before and after)  Patient's reaction to the fall: Anxious and Panicked  Name of the doctor that was notified including date and time: Jerelene Bunde, NP (02/04/2024 at 2031)  Any interventions and vital signs: Vitals signs taken and patient offered ice

## 2024-02-04 NOTE — H&P (View-Only) (Signed)
 DOB: Dec 01, 1945  .  Patient underwent colonoscopy recently due to anemia and a positive fecal occult blood test.  She had an incomplete colonoscopy at [facility] last year due to sigmoid stenosis.  She was noted to have a likely malignant partially obstructing tumor in the proximal ascending colon.  This was biopsied and tattooed.  Biopsy showed invasive moderately differentiated adenocarcinoma. Patient admitted due to Hgb of 6 on her preop labs.  She has received 2 units of pRBCs.  She will have a recheck Hgb later today.       Review of Systems: A complete review of systems was obtained from the patient.  I have reviewed this information and discussed as appropriate with the patient.  See HPI as well for other ROS.     Medical History: Past Medical History         Past Medical History:  Diagnosis Date   History of cancer            Problem List  There is no problem list on file for this patient.      Past Surgical History           Past Surgical History:  Procedure Laterality Date   breast  lumpectomy Left     HYSTERECTOMY            Allergies           Allergies  Allergen Reactions   Cortisone Unknown   Others Rash      Uncoded Allergy. Allergen: CORTISONE INJECTIONS   Ferrous Sulfate Other (See Comments)      Abdominal pains        Medications Ordered Prior to Encounter             Current Outpatient Medications on File Prior to Visit  Medication Sig Dispense Refill   ascorbic acid, vitamin C, (VITAMIN C) 1000 MG tablet Take by mouth       aspirin 81 MG EC tablet Take 81 mg by mouth once daily       beta carotene 10000 UNIT capsule Take by mouth       calcium carbonate-vitamin D3 (OS-CAL 500+D) 500 mg-5 mcg (200 unit) tablet Take 1 tablet by mouth once daily       carvediloL  (COREG ) 6.25 MG tablet Take 3.125 mg by mouth 2 (two) times daily       cranberry fruit extract (CRANBERRY EXTRACT, BULK,) 12:1 Powd Take 1 tablet by mouth once daily        cyanocobalamin  (VITAMIN B12) 1,000 mcg SL tablet Take by mouth       gabapentin  (NEURONTIN ) 100 MG capsule         Lacto.acidophilus-Bif.animalis 32 billion cell Cap Take 1 capsule by mouth once daily       Lactobacillus acidophilus (PROBIOTIC ORAL) Take by mouth       magnesium oxide-Mg AA chelate (MAGNESIUM, OXIDE/AA CHELATE,) 300 mg Cap Take by mouth       multivitamin with minerals tablet Take 1 tablet by mouth once daily       omega-3/dha/epa/dpa/fish oil (OMEGA-3 2100 ORAL) Omega 3       omeprazole (PRILOSEC) 20 MG DR capsule daily       tamoxifen  (NOLVADEX ) 10 MG tablet Take 5 mg by mouth once daily       zinc  gluconate 50 mg tablet Take by mouth        No current facility-administered medications on file prior to visit.  Family History           Family History  Problem Relation Age of Onset   High blood pressure (Hypertension) Mother     High blood pressure (Hypertension) Brother          Tobacco Use History  Social History         Tobacco Use  Smoking Status Never  Smokeless Tobacco Never        Social History  Social History           Socioeconomic History   Marital status: Married  Tobacco Use   Smoking status: Never   Smokeless tobacco: Never  Vaping Use   Vaping status: Never Used  Substance and Sexual Activity   Alcohol use: Never   Drug use: Never   Sexual activity: Defer    Social Drivers of Health           Financial Resource Strain: Low Risk  (02/19/2023)    Received from [facility]    Overall Financial Resource Strain (CARDIA)     Difficulty of Paying Living Expenses: Not hard at all  Food Insecurity: No Food Insecurity (03/06/2023)    Received from [facility]    Hunger Vital Sign     Within the past 12 months, you worried that your food would run out before you got the money to buy more.: Never true     Within the past 12 months, the food you bought just didn't last and you didn't have money to get more.: Never true   Transportation Needs: No Transportation Needs (03/06/2023)    Received from [facility] - Transportation     Lack of Transportation (Medical): No     Lack of Transportation (Non-Medical): No  Physical Activity: Inactive (02/19/2023)    Received from [facility] [address]    Exercise Vital Sign     On average, how many days per week do you engage in moderate to strenuous exercise (like a brisk walk)?: 0 days     On average, how many minutes do you engage in exercise at this level?: 0 min  Stress: No Stress Concern Present (11/26/2022)    Received from [facility] - Occupational Stress Questionnaire     Feeling of Stress : Not at all  Social Connections: Moderately Isolated (02/19/2023)    Received from [facility]    Social Connection and Isolation Panel     In a typical week, how many times do you talk on the phone with family, friends, or neighbors?: Three times a week     How often do you get together with friends or relatives?: Three times a week     How often do you attend church or religious services?: Never     Do you belong to any clubs or organizations such as church groups, unions, fraternal or athletic groups, or school groups?: No     How often do you attend meetings of the clubs or organizations you belong to?: Never     Are you married, widowed, divorced, separated, never married, or living with a partner?: Married  Housing Stability: Unknown (01/25/2024)    Housing Stability Vital Sign     Homeless in the Last Year: No        Objective:      Vitals:   02/04/24 0751 02/04/24 0802  BP: 118/69 118/69  Pulse: 99 99  Resp: (!) 21  Temp: 97.6 F (36.4 C)   SpO2: 98%       Exam Gen: NAD CV:RRR Pulm: CTA Abd: soft     Labs, Imaging and Diagnostic Testing: CEA 4.1  CT chest abdomen and pelvis shows a mass in the cecum approximately 6.8 cm.  There are adjacent pericolic lymph nodes measuring up to 1 cm.  No other  findings for distal metastatic disease noted. Assessment and Plan:  Colon cancer, ascending (CMS/HHS-HCC)  (primary encounter diagnosis) 78 year old female with anemia who presented to the office with a newly diagnosed ascending colon cancer.  We discussed partial colon resection.  She is admitted due to anemia.  We will just keep her here today and have her do her bowel prep here.  Orders written.  OR notified of inpatient status.  All questions were answered.   The surgery and anatomy were described to the patient as well as the risks of surgery and the possible complications.  These include: Bleeding, deep abdominal infections and possible wound complications such as hernia and infection, damage to adjacent structures, leak of surgical connections, which can lead to other surgeries and possibly an ostomy, possible need for other procedures, such as abscess drains in radiology, possible prolonged hospital stay, possible diarrhea from removal of part of the colon, possible constipation from narcotics, possible bowel, bladder or sexual dysfunction if having rectal surgery, prolonged fatigue/weakness or appetite loss, possible early recurrence of of disease, possible complications of their medical problems such as heart disease or arrhythmias or lung problems, death (less than 1%). I believe the patient understands and wishes to proceed with the surgery.        [name], MD Colon and Rectal Surgery [facility]

## 2024-02-04 NOTE — Consult Note (Signed)
 DOB: Dec 01, 1945  .  Patient underwent colonoscopy recently due to anemia and a positive fecal occult blood test.  She had an incomplete colonoscopy at [facility] last year due to sigmoid stenosis.  She was noted to have a likely malignant partially obstructing tumor in the proximal ascending colon.  This was biopsied and tattooed.  Biopsy showed invasive moderately differentiated adenocarcinoma. Patient admitted due to Hgb of 6 on her preop labs.  She has received 2 units of pRBCs.  She will have a recheck Hgb later today.       Review of Systems: A complete review of systems was obtained from the patient.  I have reviewed this information and discussed as appropriate with the patient.  See HPI as well for other ROS.     Medical History: Past Medical History         Past Medical History:  Diagnosis Date   History of cancer            Problem List  There is no problem list on file for this patient.      Past Surgical History           Past Surgical History:  Procedure Laterality Date   breast  lumpectomy Left     HYSTERECTOMY            Allergies           Allergies  Allergen Reactions   Cortisone Unknown   Others Rash      Uncoded Allergy. Allergen: CORTISONE INJECTIONS   Ferrous Sulfate Other (See Comments)      Abdominal pains        Medications Ordered Prior to Encounter             Current Outpatient Medications on File Prior to Visit  Medication Sig Dispense Refill   ascorbic acid, vitamin C, (VITAMIN C) 1000 MG tablet Take by mouth       aspirin 81 MG EC tablet Take 81 mg by mouth once daily       beta carotene 10000 UNIT capsule Take by mouth       calcium carbonate-vitamin D3 (OS-CAL 500+D) 500 mg-5 mcg (200 unit) tablet Take 1 tablet by mouth once daily       carvediloL  (COREG ) 6.25 MG tablet Take 3.125 mg by mouth 2 (two) times daily       cranberry fruit extract (CRANBERRY EXTRACT, BULK,) 12:1 Powd Take 1 tablet by mouth once daily        cyanocobalamin  (VITAMIN B12) 1,000 mcg SL tablet Take by mouth       gabapentin  (NEURONTIN ) 100 MG capsule         Lacto.acidophilus-Bif.animalis 32 billion cell Cap Take 1 capsule by mouth once daily       Lactobacillus acidophilus (PROBIOTIC ORAL) Take by mouth       magnesium oxide-Mg AA chelate (MAGNESIUM, OXIDE/AA CHELATE,) 300 mg Cap Take by mouth       multivitamin with minerals tablet Take 1 tablet by mouth once daily       omega-3/dha/epa/dpa/fish oil (OMEGA-3 2100 ORAL) Omega 3       omeprazole (PRILOSEC) 20 MG DR capsule daily       tamoxifen  (NOLVADEX ) 10 MG tablet Take 5 mg by mouth once daily       zinc  gluconate 50 mg tablet Take by mouth        No current facility-administered medications on file prior to visit.  Family History           Family History  Problem Relation Age of Onset   High blood pressure (Hypertension) Mother     High blood pressure (Hypertension) Brother          Tobacco Use History  Social History         Tobacco Use  Smoking Status Never  Smokeless Tobacco Never        Social History  Social History           Socioeconomic History   Marital status: Married  Tobacco Use   Smoking status: Never   Smokeless tobacco: Never  Vaping Use   Vaping status: Never Used  Substance and Sexual Activity   Alcohol use: Never   Drug use: Never   Sexual activity: Defer    Social Drivers of Health           Financial Resource Strain: Low Risk  (02/19/2023)    Received from [facility]    Overall Financial Resource Strain (CARDIA)     Difficulty of Paying Living Expenses: Not hard at all  Food Insecurity: No Food Insecurity (03/06/2023)    Received from [facility]    Hunger Vital Sign     Within the past 12 months, you worried that your food would run out before you got the money to buy more.: Never true     Within the past 12 months, the food you bought just didn't last and you didn't have money to get more.: Never true   Transportation Needs: No Transportation Needs (03/06/2023)    Received from [facility] - Transportation     Lack of Transportation (Medical): No     Lack of Transportation (Non-Medical): No  Physical Activity: Inactive (02/19/2023)    Received from [facility] [address]    Exercise Vital Sign     On average, how many days per week do you engage in moderate to strenuous exercise (like a brisk walk)?: 0 days     On average, how many minutes do you engage in exercise at this level?: 0 min  Stress: No Stress Concern Present (11/26/2022)    Received from [facility] - Occupational Stress Questionnaire     Feeling of Stress : Not at all  Social Connections: Moderately Isolated (02/19/2023)    Received from [facility]    Social Connection and Isolation Panel     In a typical week, how many times do you talk on the phone with family, friends, or neighbors?: Three times a week     How often do you get together with friends or relatives?: Three times a week     How often do you attend church or religious services?: Never     Do you belong to any clubs or organizations such as church groups, unions, fraternal or athletic groups, or school groups?: No     How often do you attend meetings of the clubs or organizations you belong to?: Never     Are you married, widowed, divorced, separated, never married, or living with a partner?: Married  Housing Stability: Unknown (01/25/2024)    Housing Stability Vital Sign     Homeless in the Last Year: No        Objective:      Vitals:   02/04/24 0751 02/04/24 0802  BP: 118/69 118/69  Pulse: 99 99  Resp: (!) 21  Temp: 97.6 F (36.4 C)   SpO2: 98%       Exam Gen: NAD CV:RRR Pulm: CTA Abd: soft     Labs, Imaging and Diagnostic Testing: CEA 4.1  CT chest abdomen and pelvis shows a mass in the cecum approximately 6.8 cm.  There are adjacent pericolic lymph nodes measuring up to 1 cm.  No other  findings for distal metastatic disease noted. Assessment and Plan:  Colon cancer, ascending (CMS/HHS-HCC)  (primary encounter diagnosis) 78 year old female with anemia who presented to the office with a newly diagnosed ascending colon cancer.  We discussed partial colon resection.  She is admitted due to anemia.  We will just keep her here today and have her do her bowel prep here.  Orders written.  OR notified of inpatient status.  All questions were answered.   The surgery and anatomy were described to the patient as well as the risks of surgery and the possible complications.  These include: Bleeding, deep abdominal infections and possible wound complications such as hernia and infection, damage to adjacent structures, leak of surgical connections, which can lead to other surgeries and possibly an ostomy, possible need for other procedures, such as abscess drains in radiology, possible prolonged hospital stay, possible diarrhea from removal of part of the colon, possible constipation from narcotics, possible bowel, bladder or sexual dysfunction if having rectal surgery, prolonged fatigue/weakness or appetite loss, possible early recurrence of of disease, possible complications of their medical problems such as heart disease or arrhythmias or lung problems, death (less than 1%). I believe the patient understands and wishes to proceed with the surgery.        [name], MD Colon and Rectal Surgery [facility]

## 2024-02-05 ENCOUNTER — Inpatient Hospital Stay (HOSPITAL_COMMUNITY): Payer: Self-pay | Admitting: Physician Assistant

## 2024-02-05 ENCOUNTER — Inpatient Hospital Stay (HOSPITAL_COMMUNITY): Admission: RE | Admit: 2024-02-05 | Admitting: General Surgery

## 2024-02-05 ENCOUNTER — Encounter (HOSPITAL_COMMUNITY): Payer: Self-pay | Admitting: Internal Medicine

## 2024-02-05 ENCOUNTER — Encounter (HOSPITAL_COMMUNITY): Payer: Self-pay | Admitting: Physician Assistant

## 2024-02-05 ENCOUNTER — Encounter (HOSPITAL_COMMUNITY): Admission: RE | Disposition: A | Payer: Self-pay | Source: Home / Self Care | Attending: General Surgery

## 2024-02-05 LAB — CBC
HCT: 34.3 % — ABNORMAL LOW (ref 36.0–46.0)
Hemoglobin: 10.9 g/dL — ABNORMAL LOW (ref 12.0–15.0)
MCH: 29.1 pg (ref 26.0–34.0)
MCHC: 31.8 g/dL (ref 30.0–36.0)
MCV: 91.7 fL (ref 80.0–100.0)
Platelets: 372 K/uL (ref 150–400)
RBC: 3.74 MIL/uL — ABNORMAL LOW (ref 3.87–5.11)
RDW: 17 % — ABNORMAL HIGH (ref 11.5–15.5)
WBC: 7.7 K/uL (ref 4.0–10.5)
nRBC: 0 % (ref 0.0–0.2)

## 2024-02-05 LAB — TYPE AND SCREEN
ABO/RH(D): O POS
Antibody Screen: NEGATIVE
Unit division: 0
Unit division: 0

## 2024-02-05 LAB — BASIC METABOLIC PANEL WITH GFR
Anion gap: 12 (ref 5–15)
BUN: 10 mg/dL (ref 8–23)
CO2: 22 mmol/L (ref 22–32)
Calcium: 8.9 mg/dL (ref 8.9–10.3)
Chloride: 106 mmol/L (ref 98–111)
Creatinine, Ser: 0.93 mg/dL (ref 0.44–1.00)
GFR, Estimated: >60 mL/min (ref >60)
Glucose, Bld: 186 mg/dL — ABNORMAL HIGH (ref 70–99)
Potassium: 3.5 mmol/L (ref 3.5–5.1)
Sodium: 140 mmol/L (ref 135–145)

## 2024-02-05 LAB — BPAM RBC
Blood Product Expiration Date: 202512222359
Blood Product Expiration Date: 202512202359
ISSUE DATE / TIME: 202512032031
ISSUE DATE / TIME: 202512041359
Unit Type and Rh: 202512222359
Unit Type and Rh: 5100
Unit Type and Rh: 5100

## 2024-02-05 SURGERY — COLECTOMY, PARTIAL, ROBOT-ASSISTED, LAPAROSCOPIC
Anesthesia: General | Laterality: Right

## 2024-02-05 MED ORDER — ACETAMINOPHEN 500 MG PO TABS
1000.0000 mg | ORAL_TABLET | ORAL | Status: AC
  Administered 2024-02-05: 1000 mg via ORAL
  Filled 2024-02-05: qty 2

## 2024-02-05 MED ORDER — ROCURONIUM BROMIDE 100 MG/10ML IV SOLN
INTRAVENOUS | Status: DC | PRN
  Administered 2024-02-05: 50 mg via INTRAVENOUS
  Administered 2024-02-05 (×2): 20 mg via INTRAVENOUS

## 2024-02-05 MED ORDER — DEXAMETHASONE SODIUM PHOSPHATE 4 MG/ML IJ SOLN
INTRAMUSCULAR | Status: DC | PRN
  Administered 2024-02-05: 5 mg via INTRAVENOUS

## 2024-02-05 MED ORDER — LACTATED RINGERS IV SOLN: INTRAVENOUS | Status: DC

## 2024-02-05 MED ORDER — LACTATED RINGERS IR SOLN
Status: DC | PRN
  Administered 2024-02-05: 1000 mL

## 2024-02-05 MED ORDER — BUPIVACAINE-EPINEPHRINE 0.25% -1:200000 IJ SOLN
INTRAMUSCULAR | Status: DC | PRN
  Administered 2024-02-05: 30 mL

## 2024-02-05 MED ORDER — PHENYLEPHRINE HCL (PRESSORS) 10 MG/ML IV SOLN
INTRAVENOUS | Status: AC
  Filled 2024-02-05: qty 1

## 2024-02-05 MED ORDER — ONDANSETRON HCL 4 MG PO TABS: 4.0000 mg | ORAL_TABLET | Freq: Four times a day (QID) | ORAL | Status: DC | PRN

## 2024-02-05 MED ORDER — ENSURE SURGERY PO LIQD
237.0000 mL | Freq: Two times a day (BID) | ORAL | Status: DC
  Administered 2024-02-05 – 2024-02-06 (×4): 237 mL via ORAL
  Filled 2024-02-05 (×6): qty 237

## 2024-02-05 MED ORDER — ORAL CARE MOUTH RINSE: 15.0000 mL | Freq: Once | OROMUCOSAL | Status: AC

## 2024-02-05 MED ORDER — PROPOFOL 10 MG/ML IV BOLUS
INTRAVENOUS | Status: AC
  Filled 2024-02-05: qty 20

## 2024-02-05 MED ORDER — SODIUM CHLORIDE 0.9 % IV SOLN
2.0000 g | INTRAVENOUS | Status: AC
  Administered 2024-02-05: 2 g via INTRAVENOUS
  Filled 2024-02-05: qty 2

## 2024-02-05 MED ORDER — PROPOFOL 10 MG/ML IV BOLUS
INTRAVENOUS | Status: DC | PRN
  Administered 2024-02-05: 100 mg via INTRAVENOUS

## 2024-02-05 MED ORDER — 0.9 % SODIUM CHLORIDE (POUR BTL) OPTIME
TOPICAL | Status: DC | PRN
  Administered 2024-02-05: 1000 mL

## 2024-02-05 MED ORDER — DIPHENHYDRAMINE HCL 12.5 MG/5ML PO ELIX: 12.5000 mg | ORAL_SOLUTION | Freq: Four times a day (QID) | ORAL | Status: DC | PRN

## 2024-02-05 MED ORDER — FENTANYL CITRATE (PF) 100 MCG/2ML IJ SOLN
INTRAMUSCULAR | Status: AC
  Filled 2024-02-05: qty 2

## 2024-02-05 MED ORDER — PHENYLEPHRINE HCL-NACL 20-0.9 MG/250ML-% IV SOLN
INTRAVENOUS | Status: DC | PRN
  Administered 2024-02-05: 25 ug/min via INTRAVENOUS

## 2024-02-05 MED ORDER — LIDOCAINE HCL (CARDIAC) PF 100 MG/5ML IV SOSY
PREFILLED_SYRINGE | INTRAVENOUS | Status: DC | PRN
  Administered 2024-02-05: 8 mg via INTRAVENOUS

## 2024-02-05 MED ORDER — ACETAMINOPHEN 500 MG PO TABS
1000.0000 mg | ORAL_TABLET | Freq: Four times a day (QID) | ORAL | Status: DC
  Administered 2024-02-05 – 2024-02-07 (×5): 1000 mg via ORAL
  Filled 2024-02-05 (×9): qty 2

## 2024-02-05 MED ORDER — ENSURE PRE-SURGERY PO LIQD: 592.0000 mL | Freq: Once | ORAL | Status: DC

## 2024-02-05 MED ORDER — ONDANSETRON HCL 4 MG/2ML IJ SOLN
INTRAMUSCULAR | Status: DC | PRN
  Administered 2024-02-05: 4 mg via INTRAVENOUS

## 2024-02-05 MED ORDER — FENTANYL CITRATE (PF) 100 MCG/2ML IJ SOLN
INTRAMUSCULAR | Status: DC | PRN
  Administered 2024-02-05 (×3): 50 ug via INTRAVENOUS

## 2024-02-05 MED ORDER — ALUM & MAG HYDROXIDE-SIMETH 200-200-20 MG/5ML PO SUSP
30.0000 mL | Freq: Four times a day (QID) | ORAL | Status: DC | PRN
  Administered 2024-02-06: 30 mL via ORAL
  Filled 2024-02-05 (×2): qty 30

## 2024-02-05 MED ORDER — TRAMADOL HCL 50 MG PO TABS
50.0000 mg | ORAL_TABLET | Freq: Four times a day (QID) | ORAL | Status: DC | PRN
  Administered 2024-02-06: 100 mg via ORAL
  Filled 2024-02-05: qty 2
  Filled 2024-02-05: qty 1

## 2024-02-05 MED ORDER — LACTATED RINGERS IV SOLN: INTRAVENOUS | Status: DC | PRN

## 2024-02-05 MED ORDER — GABAPENTIN 300 MG PO CAPS
300.0000 mg | ORAL_CAPSULE | Freq: Two times a day (BID) | ORAL | Status: DC
  Administered 2024-02-05 – 2024-02-06 (×4): 300 mg via ORAL
  Filled 2024-02-05 (×5): qty 1

## 2024-02-05 MED ORDER — SODIUM CHLORIDE 0.9 % IV SOLN
2.0000 g | Freq: Two times a day (BID) | INTRAVENOUS | Status: AC
  Administered 2024-02-05: 2 g via INTRAVENOUS
  Filled 2024-02-05: qty 2

## 2024-02-05 MED ORDER — ENSURE PRE-SURGERY PO LIQD: 296.0000 mL | Freq: Once | ORAL | Status: DC

## 2024-02-05 MED ORDER — HYDROMORPHONE HCL 1 MG/ML IJ SOLN
0.5000 mg | INTRAMUSCULAR | Status: DC | PRN
  Administered 2024-02-05 – 2024-02-06 (×8): 0.5 mg via INTRAVENOUS
  Filled 2024-02-05 (×9): qty 0.5

## 2024-02-05 MED ORDER — SACCHAROMYCES BOULARDII 250 MG PO CAPS
250.0000 mg | ORAL_CAPSULE | Freq: Two times a day (BID) | ORAL | Status: DC
  Administered 2024-02-05 – 2024-02-06 (×4): 250 mg via ORAL
  Filled 2024-02-05 (×4): qty 1

## 2024-02-05 MED ORDER — SUGAMMADEX SODIUM 200 MG/2ML IV SOLN
INTRAVENOUS | Status: DC | PRN
  Administered 2024-02-05: 150 mg via INTRAVENOUS

## 2024-02-05 MED ORDER — DIPHENHYDRAMINE HCL 50 MG/ML IJ SOLN: 12.5000 mg | Freq: Four times a day (QID) | INTRAMUSCULAR | Status: DC | PRN

## 2024-02-05 MED ORDER — OXYCODONE HCL 5 MG PO TABS: 5.0000 mg | ORAL_TABLET | Freq: Once | ORAL | Status: DC | PRN

## 2024-02-05 MED ORDER — FENTANYL CITRATE (PF) 50 MCG/ML IJ SOSY: 25.0000 ug | PREFILLED_SYRINGE | INTRAMUSCULAR | Status: DC | PRN

## 2024-02-05 MED ORDER — ALVIMOPAN 12 MG PO CAPS
12.0000 mg | ORAL_CAPSULE | Freq: Two times a day (BID) | ORAL | Status: DC
  Filled 2024-02-05 (×3): qty 1

## 2024-02-05 MED ORDER — ONDANSETRON HCL 4 MG/2ML IJ SOLN: 4.0000 mg | Freq: Four times a day (QID) | INTRAMUSCULAR | Status: DC | PRN

## 2024-02-05 MED ORDER — CHLORHEXIDINE GLUCONATE 0.12 % MT SOLN
15.0000 mL | Freq: Once | OROMUCOSAL | Status: AC
  Administered 2024-02-05: 15 mL via OROMUCOSAL

## 2024-02-05 MED ORDER — ENOXAPARIN SODIUM 40 MG/0.4ML IJ SOSY
40.0000 mg | PREFILLED_SYRINGE | INTRAMUSCULAR | Status: DC
  Administered 2024-02-06: 40 mg via SUBCUTANEOUS
  Filled 2024-02-05: qty 0.4

## 2024-02-05 MED ORDER — PHENYLEPHRINE HCL-NACL 20-0.9 MG/250ML-% IV SOLN
INTRAVENOUS | Status: AC
  Filled 2024-02-05: qty 250

## 2024-02-05 MED ORDER — DROPERIDOL 2.5 MG/ML IJ SOLN: 0.6250 mg | Freq: Once | INTRAMUSCULAR | Status: DC | PRN

## 2024-02-05 MED ORDER — LIDOCAINE 5 % EX PTCH
1.0000 | MEDICATED_PATCH | CUTANEOUS | Status: DC
  Administered 2024-02-06: 1 via TRANSDERMAL
  Filled 2024-02-05: qty 1

## 2024-02-05 MED ORDER — SIMETHICONE 80 MG PO CHEW: 40.0000 mg | CHEWABLE_TABLET | Freq: Four times a day (QID) | ORAL | Status: DC | PRN

## 2024-02-05 MED ORDER — LIDOCAINE 2% (20 MG/ML) 5 ML SYRINGE
INTRAMUSCULAR | Status: DC | PRN
  Administered 2024-02-05: 1.5 mg/kg/h via INTRAVENOUS

## 2024-02-05 MED ORDER — ALVIMOPAN 12 MG PO CAPS
12.0000 mg | ORAL_CAPSULE | ORAL | Status: AC
  Administered 2024-02-05: 12 mg via ORAL
  Filled 2024-02-05: qty 1

## 2024-02-05 MED ORDER — KCL IN DEXTROSE-NACL 20-5-0.45 MEQ/L-%-% IV SOLN
INTRAVENOUS | Status: DC
  Filled 2024-02-05 (×2): qty 1000

## 2024-02-05 MED ORDER — OXYCODONE HCL 5 MG/5ML PO SOLN: 5.0000 mg | Freq: Once | ORAL | Status: DC | PRN

## 2024-02-05 MED ORDER — GABAPENTIN 300 MG PO CAPS
300.0000 mg | ORAL_CAPSULE | ORAL | Status: AC
  Administered 2024-02-05: 300 mg via ORAL
  Filled 2024-02-05: qty 1

## 2024-02-05 MED ORDER — BUPIVACAINE-EPINEPHRINE (PF) 0.25% -1:200000 IJ SOLN
INTRAMUSCULAR | Status: AC
  Filled 2024-02-05: qty 30

## 2024-02-05 SURGICAL SUPPLY — 69 items
BAG COUNTER SPONGE SURGICOUNT (BAG) ×2 IMPLANT
BLADE EXTENDED COATED 6.5IN (ELECTRODE) IMPLANT
CANNULA REDUCER 12-8 DVNC XI (CANNULA) IMPLANT
COVER SURGICAL LIGHT HANDLE (MISCELLANEOUS) ×4 IMPLANT
COVER TIP SHEARS 8 DVNC (MISCELLANEOUS) ×2 IMPLANT
DEFOGGER SCOPE WARM SEASHARP (MISCELLANEOUS) ×2 IMPLANT
DERMABOND ADVANCED .7 DNX12 (GAUZE/BANDAGES/DRESSINGS) IMPLANT
DRAIN CHANNEL 19F RND (DRAIN) IMPLANT
DRAPE ARM DVNC X/XI (DISPOSABLE) ×8 IMPLANT
DRAPE COLUMN DVNC XI (DISPOSABLE) ×2 IMPLANT
DRAPE CV SPLIT W-CLR ANES SCRN (DRAPES) ×2 IMPLANT
DRAPE PERI GROIN 82X75IN TIB (DRAPES) ×2 IMPLANT
DRAPE SURG IRRIG POUCH 19X23 (DRAPES) ×2 IMPLANT
DRIVER NDL LRG 8 DVNC XI (INSTRUMENTS) ×2 IMPLANT
DRIVER NDLE LRG 8 DVNC XI (INSTRUMENTS) ×1 IMPLANT
DRSG OPSITE POSTOP 4X6 (GAUZE/BANDAGES/DRESSINGS) IMPLANT
DRSG OPSITE POSTOP 4X8 (GAUZE/BANDAGES/DRESSINGS) IMPLANT
ELECT PENCIL ROCKER SW 15FT (MISCELLANEOUS) ×2 IMPLANT
ELECT REM PT RETURN 15FT ADLT (MISCELLANEOUS) ×2 IMPLANT
EVACUATOR SILICONE 100CC (DRAIN) IMPLANT
FORCEPS BPLR FENES DVNC XI (FORCEP) IMPLANT
GLOVE BIO SURGEON STRL SZ 6.5 (GLOVE) ×6 IMPLANT
GLOVE INDICATOR 6.5 STRL GRN (GLOVE) ×6 IMPLANT
GOWN SRG XL LVL 4 BRTHBL STRL (GOWNS) ×2 IMPLANT
GOWN STRL REUS W/ TWL XL LVL3 (GOWN DISPOSABLE) ×6 IMPLANT
GRASPER SUT TROCAR 14GX15 (MISCELLANEOUS) IMPLANT
GRASPER TIP-UP FEN DVNC XI (INSTRUMENTS) ×2 IMPLANT
HOLDER FOLEY CATH W/STRAP (MISCELLANEOUS) ×2 IMPLANT
IRRIGATION SUCT STRKRFLW 2 WTP (MISCELLANEOUS) ×2 IMPLANT
KIT PROCEDURE DVNC SI (MISCELLANEOUS) IMPLANT
KIT TURNOVER KIT A (KITS) ×2 IMPLANT
NDL INSUFFLATION 14GA 120MM (NEEDLE) ×2 IMPLANT
NEEDLE INSUFFLATION 14GA 120MM (NEEDLE) ×1 IMPLANT
PACK COLON (CUSTOM PROCEDURE TRAY) ×2 IMPLANT
PAD POSITIONING PINK XL (MISCELLANEOUS) ×2 IMPLANT
RELOAD STAPLE 60 2.5 WHT DVNC (STAPLE) IMPLANT
RELOAD STAPLE 60 3.5 BLU DVNC (STAPLE) IMPLANT
RELOAD STAPLE 60 4.3 GRN DVNC (STAPLE) IMPLANT
RETRACTOR WND ALEXIS 18 MED (MISCELLANEOUS) IMPLANT
SCISSORS LAP 5X35 DISP (ENDOMECHANICALS) IMPLANT
SCISSORS MNPLR CVD DVNC XI (INSTRUMENTS) ×2 IMPLANT
SEAL UNIV 5-12 XI (MISCELLANEOUS) ×6 IMPLANT
SEALER VESSEL EXT DVNC XI (MISCELLANEOUS) ×2 IMPLANT
SOLUTION ELECTROSURG ANTI STCK (MISCELLANEOUS) ×2 IMPLANT
SPIKE FLUID TRANSFER (MISCELLANEOUS) IMPLANT
STAPLER 60 SUREFORM DVNC (STAPLE) IMPLANT
STAPLER ECHELON POWER CIR 29 (STAPLE) IMPLANT
STAPLER ECHELON POWER CIR 31 (STAPLE) IMPLANT
SUT ETHILON 2 0 PS N (SUTURE) IMPLANT
SUT NOVA NAB GS-21 1 T12 (SUTURE) ×4 IMPLANT
SUT PROLENE 2 0 KS (SUTURE) IMPLANT
SUT SILK 2 0 SH CR/8 (SUTURE) IMPLANT
SUT SILK 2-0 18XBRD TIE 12 (SUTURE) ×2 IMPLANT
SUT SILK 3 0 SH CR/8 (SUTURE) ×2 IMPLANT
SUT SILK 3-0 18XBRD TIE 12 (SUTURE) IMPLANT
SUT VIC AB 2-0 SH 18 (SUTURE) IMPLANT
SUT VIC AB 2-0 SH 27X BRD (SUTURE) IMPLANT
SUT VIC AB 3-0 SH 18 (SUTURE) IMPLANT
SUT VIC AB 4-0 PS2 27 (SUTURE) ×4 IMPLANT
SUT VICRYL 0 UR6 27IN ABS (SUTURE) ×2 IMPLANT
SUTURE V-LC BRB 180 2/0GR6GS22 (SUTURE) IMPLANT
SUTURE VLOC BRB 180 ABS3/0GR12 (SUTURE) IMPLANT
SYR 20ML ECCENTRIC (SYRINGE) ×2 IMPLANT
SYSTEM WOUND ALEXIS 18CM MED (MISCELLANEOUS) IMPLANT
TOWEL OR DSP ST BLU DLX 10/PK (DISPOSABLE) IMPLANT
TRAY FOLEY MTR SLVR 16FR STAT (SET/KITS/TRAYS/PACK) ×2 IMPLANT
TROCAR ADV FIXATION 5X100MM (TROCAR) ×2 IMPLANT
TUBING CONNECTING 10 (TUBING) ×4 IMPLANT
TUBING INSUFFLATION 10FT LAP (TUBING) ×2 IMPLANT

## 2024-02-05 NOTE — Anesthesia Preprocedure Evaluation (Addendum)
 Anesthesia Evaluation  Patient identified by MRN, date of birth, ID band Patient awake    Reviewed: Allergy & Precautions, NPO status , Patient's Chart, lab work & pertinent test results  History of Anesthesia Complications Negative for: history of anesthetic complications  Airway Mallampati: II  TM Distance: >3 FB Neck ROM: Full    Dental no notable dental hx. (+) Teeth Intact   Pulmonary neg pulmonary ROS, neg sleep apnea, neg COPD, Patient abstained from smoking.Not current smoker   Pulmonary exam normal breath sounds clear to auscultation       Cardiovascular Exercise Tolerance: Good METShypertension, Pt. on medications + CAD  (-) Past MI (-) dysrhythmias  Rhythm:Regular Rate:Normal - Systolic murmurs Per cardiology: From cardiac standpoint no active of symptoms of chest pain, shortness of breath, no active symptoms suggestive of angina or heart failure.   Appears euvolemic and compensated. She does have sinus tachycardia in the setting of anemia which is a physiological response.  No obvious ischemic changes at her current elevated heart rates and sinus tachycardia.   From cardiac standpoint okay to proceed with her elective surgery as being planned. She does remain at elevated risk for perioperative cardiac complications given her elderly age, anemia, sinus tachycardia at baseline. However no significant change in management is expected with further cardiac risk stratification. No ischemic evaluation indicated at this time.   Proceed with her surgery as being planned.    Neuro/Psych  Headaches  negative psych ROS   GI/Hepatic ,GERD  ,,(+)     (-) substance abuse    Endo/Other  neg diabetes    Renal/GU negative Renal ROS     Musculoskeletal   Abdominal   Peds  Hematology  (+) Blood dyscrasia, anemia   Anesthesia Other Findings Past Medical History: No date: Anemia No date: Breast cancer  (HCC) 12/22/2016: CAD (coronary artery disease), native coronary artery 11/2018: Cancer (HCC)     Comment:  left breast DCIS No date: Cardiomyopathy (HCC) 10/27/2017: Carotid bruit No date: Complication of anesthesia     Comment:  states ear drum burst during hysterectomy surgery 01/20/2022: Coronary arteriosclerosis 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 11/26/2022: Dyslipidemia 12/28/2014: Essential hypertension No date: GERD (gastroesophageal reflux disease) No date: Headache     Comment:  r/t sinus 01/20/2022: Hearing loss 10/27/2017: History of cardiomyopathy No date: History of kidney stones No date: HOH (hard of hearing)     Comment:  left ear-had hearing aid 12/22/2016: Hyperlipemia No date: Hyperlipidemia No date: Hypertension 01/20/2022: Intraductal carcinoma in situ of breast     Comment:  11/2018-High grade-lumpectomy-radiation 02/2019-03/14/2019 01/20/2022: Mixed hyperlipidemia No date: Palpitations 11/2022: Paroxysmal A-fib (HCC) No date: Personal history of radiation therapy  Reproductive/Obstetrics                              Anesthesia Physical Anesthesia Plan  ASA: 3  Anesthesia Plan: General   Post-op Pain Management: Tylenol  PO (pre-op)* and Gabapentin  PO (pre-op)*   Induction: Intravenous  PONV Risk Score and Plan: 4 or greater and Ondansetron , Dexamethasone  and Treatment may vary due to age or medical condition  Airway Management Planned: Oral ETT  Additional Equipment: None  Intra-op Plan:   Post-operative Plan: Extubation in OR  Informed Consent: I have reviewed the patients History and Physical, chart, labs and discussed the procedure including the risks, benefits and alternatives for the proposed anesthesia with the patient or authorized representative who has indicated  his/her understanding and acceptance.     Dental advisory given  Plan Discussed with: CRNA and Surgeon  Anesthesia Plan  Comments: (Discussed risks of anesthesia with patient, including PONV, sore throat, lip/dental/eye damage. Rare risks discussed as well, such as cardiorespiratory and neurological sequelae, and allergic reactions. Discussed the role of CRNA in patient's perioperative care. Patient understands.)        Anesthesia Quick Evaluation

## 2024-02-05 NOTE — Anesthesia Procedure Notes (Signed)
 Procedure Name: Intubation Date/Time: 02/05/2024 7:48 AM  Performed by: Dartha Meckel, CRNAPre-anesthesia Checklist: Patient identified, Emergency Drugs available, Suction available and Patient being monitored Patient Re-evaluated:Patient Re-evaluated prior to induction Oxygen Delivery Method: Circle system utilized Preoxygenation: Pre-oxygenation with 100% oxygen Induction Type: IV induction Ventilation: Mask ventilation without difficulty Laryngoscope Size: Mac and 3 Grade View: Grade I Tube type: Oral Tube size: 6.0 mm Number of attempts: 1 Airway Equipment and Method: Stylet and Oral airway Placement Confirmation: ETT inserted through vocal cords under direct vision, positive ETCO2 and breath sounds checked- equal and bilateral Secured at: 19 cm Tube secured with: Tape Dental Injury: Teeth and Oropharynx as per pre-operative assessment

## 2024-02-05 NOTE — Transfer of Care (Signed)
 Immediate Anesthesia Transfer of Care Note  Patient: Andrea [name]  Procedure(s) Performed: ROBOTIC RIGHT COLECTOMY (Right)  Patient Location: PACU  Anesthesia Type:General  Level of Consciousness: awake and alert   Airway & Oxygen Therapy: Patient Spontanous Breathing and Patient connected to nasal cannula oxygen  Post-op Assessment: Report given to RN and Post -op Vital signs reviewed and stable  Post vital signs: Reviewed and stable  Last Vitals:  Vitals Value Taken Time  BP 139/78 02/05/24 09:57  Temp 36.3 C 02/05/24 09:57  Pulse 90 02/05/24 09:58  Resp 14 02/05/24 09:58  SpO2 100 % 02/05/24 09:58  Vitals shown include unfiled device data.  Last Pain:  Vitals:   02/05/24 0622  TempSrc:   PainSc: 0-No pain      Patients Stated Pain Goal: 0 (02/04/24 0751)  Complications: No notable events documented.

## 2024-02-05 NOTE — Plan of Care (Signed)
   Problem: Activity: Goal: Risk for activity intolerance will decrease Outcome: Progressing   Problem: Coping: Goal: Level of anxiety will decrease Outcome: Progressing   Problem: Pain Managment: Goal: General experience of comfort will improve and/or be controlled Outcome: Progressing   Problem: Skin Integrity: Goal: Risk for impaired skin integrity will decrease Outcome: Progressing

## 2024-02-05 NOTE — Discharge Instructions (Signed)

## 2024-02-05 NOTE — Anesthesia Postprocedure Evaluation (Signed)
 Anesthesia Post Note  Patient: Andrea [name]  Procedure(s) Performed: ROBOTIC RIGHT COLECTOMY (Right)     Patient location during evaluation: PACU Anesthesia Type: General Level of consciousness: awake and alert Pain management: pain level controlled Vital Signs Assessment: post-procedure vital signs reviewed and stable Respiratory status: spontaneous breathing, nonlabored ventilation, respiratory function stable and patient connected to nasal cannula oxygen Cardiovascular status: blood pressure returned to baseline and stable Postop Assessment: no apparent nausea or vomiting Anesthetic complications: no   No notable events documented.  Last Vitals:  Vitals:   02/05/24 1041 02/05/24 1057  BP: 129/74 136/71  Pulse: 84 79  Resp: 13 16  Temp:    SpO2: 100% 100%    Last Pain:  Vitals:   02/05/24 1030  TempSrc:   PainSc: 3                  Rome Ade

## 2024-02-05 NOTE — Interval H&P Note (Signed)
 History and Physical Interval Note:  02/05/2024 7:03 AM  Andrea [name]  has presented today for surgery, with the diagnosis of COLON CANCER.  The various methods of treatment have been discussed with the patient and family. After consideration of risks, benefits and other options for treatment, the patient has consented to  Procedure(s): COLECTOMY, PARTIAL, ROBOT-ASSISTED, LAPAROSCOPIC (N/A) as a surgical intervention.  The patient's history has been reviewed, patient examined, no change in status, stable for surgery.  I have reviewed the patient's chart and labs.  Questions were answered to the patient's satisfaction.  Hgb 10.4    [name], MD  Colorectal and General Surgery [facility]

## 2024-02-05 NOTE — Op Note (Signed)
 02/05/2024  9:52 AM  PATIENT:  Andrea [name]  78 y.o. female  Patient Care Team: Teressa Harrie HERO, FNP as PCP - General (Family Medicine) [name], MD as Consulting Physician (Hematology and Oncology) [facility], [name], Jennifer SAUNDERS, MD as Consulting Physician (Cardiology) Vanderbilt Ned, MD as Consulting Physician (Oncology) Latisha Medford, MD as Consulting Physician (Obstetrics and Gynecology)  PRE-OPERATIVE DIAGNOSIS:  COLON CANCER  POST-OPERATIVE DIAGNOSIS:  COLON CANCER  PROCEDURE:  ROBOTIC RIGHT COLECTOMY   Surgeon(s): Sheldon Standing, MD Ned Hila, MD  ASSISTANT: Dr Sheldon   ANESTHESIA:   local and general  EBL: 30ml  Total I/O In: 900 [I.V.:800; IV Piggyback:100] Out: -   SPECIMEN:  Source of Specimen:  R colon  DISPOSITION OF SPECIMEN:  PATHOLOGY  COUNTS:  YES  PLAN OF CARE: Admit to inpatient   PATIENT DISPOSITION:  PACU - hemodynamically stable.  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  I recommended segmental resection:  The anatomy & physiology of the digestive tract was discussed.  The pathophysiology was discussed.  Natural history risks without surgery was discussed.   I worked to give an overview of the disease and the frequent need to have multispecialty involvement.  I feel the risks of no intervention will lead to serious problems that outweigh the operative risks; therefore, I recommended a partial colectomy to remove the pathology.  Minimally invasive & open techniques were discussed.   Risks such as bleeding, infection, abscess, leak, reoperation, possible ostomy, hernia, heart attack, death, and other risks were discussed.  I noted a good likelihood this will help address the problem.   Goals of post-operative recovery were discussed as well.    The patient expressed understanding & wished to proceed with surgery.  OR FINDINGS:   Patient had  mass in the ascending colon with tattoo.  No obvious metastatic disease on visceral parietal peritoneum or liver.  DESCRIPTION:   Informed consent was confirmed.  The patient underwent general anaesthesia without difficulty.  The patient was positioned appropriately.  VTE prevention in place.  The patient's abdomen was clipped, prepped, & draped in a sterile fashion.  Surgical timeout confirmed our plan.  The patient was positioned in reverse Trendelenburg.  Abdominal entry was gained using Varies in the LUQ.  Entry was clean.  I induced carbon dioxide insufflation.  An 8mm robotic port was placed in the LLQ.  Camera inspection revealed no injury.  Extra ports were carefully placed under direct laparoscopic visualization.  I laparoscopically reflected the greater omentum and the upper abdomen the small bowel in the peilvis. The patient was appropriately positioned and the robot was docked to the patient's right side.  Instruments were placed under direct visualization.    I began by identifying the ileocolic artery and vein within the mesentery. Dissection was bluntly carried around these structures. The duodenum was identified and free from the structures. I then separated the structures bluntly and used the robotic vessel sealer device to transect these.  I developed the retroperitoneal plane bluntly.  I then freed the appendix off its attachments to the pelvic wall. I mobilized the terminal ileum.  I took care to avoid injuring any retroperitoneal structures.  After this I began to mobilize laterally down the white line of Toldt and then took down the hepatic flexure using the Enseal device. I mobilized the omentum off of the right transverse colon. The entire colon was then flipped  medially and mobilized off of the retroperitoneal structures until I could visualize the lateral edge of the duodenum underneath.  I gently freed the duodenal attachments.   I identified a portion of mesentery of the  transverse colon just proximal to the right branch of the middle colic.  I divided up to the colon from the previous dissection of the mesentery using the robotic vessel sealer.  I then divided the terminal ileal mesentery in similar fashion.  At that point, the terminal ileum was divided with a blue load robotic 60 mm stapler.  The transverse colon was divided with a green load robotic stapler.  The specimen was then completely free and placed in the right upper quadrant.  Hemostasis was good. I then oriented the remaining terminal ileum and transverse colon and a isoperistaltic fashion.  I placed an enterotomy in the small bowel and colon using the robotic scissors.  I then introduced a white load 60 mm robotic stapler into both enterotomies and created an anastomosis between the small bowel and transverse colon.  Hemostasis within the staple line was good. The common enterotomy channel was closed using 2 running 2-0 V-Loc sutures.  The abdomen was then irrigated with normal saline. The omentum was then brought down over the anastomosis.  At this point the robot was undocked.  The 12 mm suprapubic port was enlarged to a Pfannenstiel incision and an Alexis wound protector was placed.  The specimen was removed from the abdomen and evaluated.  Once the abdomen was inspected for hemostasis, the Alexis wound protector was removed.    The peritoneum of the Pfannenstiel incision was closed using a running 0 Vicryl suture.  The fascia was then closed using #1 Novafil interrupted sutures.  The subcutaneous tissue of the extraction incision was closed using a running 2-0 Vicryl suture. The skin was then closed using running subcuticular 4-0 Vicryl sutures.  A sterile dressing was applied.  The remaining port sites were closed using interrupted 4-0 Vicryl sutures and Dermabond. All counts were correct per operating room staff. The patient was then awakened from anesthesia and sent to the post anesthesia care unit in stable  condition.   [name], MD  Colorectal and General Surgery [facility]

## 2024-02-06 LAB — CBC
HCT: 30.8 % — ABNORMAL LOW (ref 36.0–46.0)
Hemoglobin: 9.4 g/dL — ABNORMAL LOW (ref 12.0–15.0)
MCH: 28.1 pg (ref 26.0–34.0)
MCHC: 30.5 g/dL (ref 30.0–36.0)
MCV: 92.2 fL (ref 80.0–100.0)
Platelets: 366 K/uL (ref 150–400)
RBC: 3.34 MIL/uL — ABNORMAL LOW (ref 3.87–5.11)
RDW: 16.7 % — ABNORMAL HIGH (ref 11.5–15.5)
WBC: 12.5 K/uL — ABNORMAL HIGH (ref 4.0–10.5)
nRBC: 0 % (ref 0.0–0.2)

## 2024-02-06 LAB — BASIC METABOLIC PANEL WITH GFR
Anion gap: 9 (ref 5–15)
BUN: 12 mg/dL (ref 8–23)
CO2: 23 mmol/L (ref 22–32)
Calcium: 8.5 mg/dL — ABNORMAL LOW (ref 8.9–10.3)
Chloride: 107 mmol/L (ref 98–111)
Creatinine, Ser: 0.72 mg/dL (ref 0.44–1.00)
GFR, Estimated: >60 mL/min (ref >60)
Glucose, Bld: 120 mg/dL — ABNORMAL HIGH (ref 70–99)
Potassium: 3.8 mmol/L (ref 3.5–5.1)
Sodium: 138 mmol/L (ref 135–145)

## 2024-02-06 NOTE — Progress Notes (Signed)
 Mobility Specialist Progress Note:  99 BPM HR, 120/75 mmHg (87 MAP) BP   02/06/24 1432  Mobility  Activity Ambulated with assistance  Level of Assistance Minimal assist, patient does 75% or more  Assistive Device Front wheel walker  Distance Ambulated (ft) 60 ft  Activity Response Tolerated fair  Mobility Referral Yes  Mobility visit 1 Mobility  Mobility Specialist Start Time (ACUTE ONLY) 1402  Mobility Specialist Stop Time (ACUTE ONLY) 1427  Mobility Specialist Time Calculation (min) (ACUTE ONLY) 25 min   Pt was received in bed and agreed to mobility. Pt stated dizziness when sitting edge of bed, subsided after a few minutes, checked BP. Min A sit to stand. 2x bouts of unsteadiness during ambulation. Returned to bed with all needs met. Call bell in reach and bed alarm on.    [employer] - Mobility Specialist

## 2024-02-06 NOTE — Progress Notes (Signed)
 1 Day Post-Op  Subjective: No acute issues overnight. Had some pain which is improved this morning. Denies nausea/vomiting. Passing flatus. Tried to ambulate yesterday but felt weak.   Objective: Vital signs in last 24 hours: Temp:  [97.4 F (36.3 C)-97.5 F (36.4 C)] 97.5 F (36.4 C) (12/06 0609) Pulse Rate:  [79-94] 82 (12/06 0609) Resp:  [13-20] 20 (12/06 0609) BP: (103-139)/(63-104) 107/72 (12/06 0609) SpO2:  [97 %-100 %] 99 % (12/06 0609) Weight:  [54.3 kg] 54.3 kg (12/06 0500) Last BM Date : 02/04/24  Intake/Output from previous day: 12/05 0701 - 12/06 0700 In: 1698.9 [I.V.:1598.9; IV Piggyback:100] Out: 820 [Urine:800; Blood:20] Intake/Output this shift: No intake/output data recorded.  PE: General: resting comfortably, NAD Neuro: alert and oriented, no focal deficits Resp: normal work of breathing Abdomen: soft, nondistended, appropriate incisional tenderness to palpation. Incisions clean and dry, honeycomb dressing in place over Pfannensteil. Extremities: warm and well-perfused GU: foley draining clear yellow urine   Lab Results:  Recent Labs    02/05/24 1057 02/06/24 0727  WBC 7.7 12.5*  HGB 10.9* 9.4*  HCT 34.3* 30.8*  PLT 372 366   BMET Recent Labs    02/05/24 1057 02/06/24 0727  NA 140 138  K 3.5 3.8  CL 106 107  CO2 22 23  GLUCOSE 186* 120*  BUN 10 12  CREATININE 0.93 0.72  CALCIUM  8.9 8.5*   PT/INR No results for input(s): LABPROT, INR in the last 72 hours. CMP     Component Value Date/Time   NA 138 02/06/2024 0727   NA 144 02/19/2023 1439   K 3.8 02/06/2024 0727   CL 107 02/06/2024 0727   CO2 23 02/06/2024 0727   GLUCOSE 120 (H) 02/06/2024 0727   BUN 12 02/06/2024 0727   BUN 27 02/19/2023 1439   CREATININE 0.72 02/06/2024 0727   CREATININE 0.95 01/12/2024 1359   CALCIUM  8.5 (L) 02/06/2024 0727   PROT 6.5 02/03/2024 1449   PROT 6.8 02/19/2023 1439   ALBUMIN 3.2 (L) 02/03/2024 1449   ALBUMIN 4.3 02/19/2023 1439    AST 20 02/03/2024 1449   AST 16 01/12/2024 1359   ALT <5 02/03/2024 1449   ALT <5 01/12/2024 1359   ALKPHOS 73 02/03/2024 1449   BILITOT <0.2 02/03/2024 1449   BILITOT <0.2 01/12/2024 1359   GFRNONAA >60 02/06/2024 0727   GFRNONAA >60 01/12/2024 1359   GFRAA 79 12/07/2019 0914   GFRAA >60 07/08/2019 1150   Lipase     Component Value Date/Time   LIPASE 156 (H) 03/27/2023 1303       Studies/Results: No results found.  Anti-infectives: Anti-infectives (From admission, onward)    Start     Dose/Rate Route Frequency Ordered Stop   02/05/24 2000  cefoTEtan  (CEFOTAN ) 2 g in sodium chloride  0.9 % 100 mL IVPB        2 g 200 mL/hr over 30 Minutes Intravenous Every 12 hours 02/05/24 1050 02/06/24 0744   02/05/24 0645  cefoTEtan  (CEFOTAN ) 2 g in sodium chloride  0.9 % 100 mL IVPB        2 g 200 mL/hr over 30 Minutes Intravenous On call to O.R. 02/05/24 0620 02/06/24 0744   02/04/24 2000  neomycin  (MYCIFRADIN ) tablet 500 mg        500 mg Oral  Once 02/04/24 0732 02/04/24 1944   02/04/24 2000  metroNIDAZOLE  (FLAGYL ) tablet 500 mg        500 mg Oral  Once 02/04/24 0732 02/04/24 1944  02/04/24 1400  neomycin  (MYCIFRADIN ) tablet 500 mg        500 mg Oral  Once 02/04/24 0732 02/04/24 1421   02/04/24 1400  metroNIDAZOLE  (FLAGYL ) tablet 500 mg        500 mg Oral  Once 02/04/24 0732 02/04/24 1421   02/04/24 1300  neomycin  (MYCIFRADIN ) tablet 500 mg        500 mg Oral  Once 02/04/24 0732 02/04/24 1513   02/04/24 1300  metroNIDAZOLE  (FLAGYL ) tablet 500 mg        500 mg Oral  Once 02/04/24 0732 02/04/24 1513        Assessment/Plan 78 yo female POD1 s/p right hemicolectomy. - Advance diet as tolerated - SLIV - Remove foley - Ambulate - Multimodal pain control - VTE: lovenox , SCDs - Dispo: inpatient, med-surg floor    LOS: 2 days    Leonor Dawn, MD [facility] Surgery General, Hepatobiliary and Pancreatic Surgery 02/06/24 8:01 AM

## 2024-02-06 NOTE — Plan of Care (Signed)
  Problem: Coping: Goal: Level of anxiety will decrease Outcome: Progressing   Problem: Pain Managment: Goal: General experience of comfort will improve and/or be controlled Outcome: Progressing   Problem: Safety: Goal: Ability to remain free from injury will improve Outcome: Progressing   Problem: Skin Integrity: Goal: Risk for impaired skin integrity will decrease Outcome: Progressing   Problem: Clinical Measurements: Goal: Postoperative complications will be avoided or minimized Outcome: Progressing

## 2024-02-07 ENCOUNTER — Other Ambulatory Visit (HOSPITAL_COMMUNITY): Payer: Self-pay

## 2024-02-07 ENCOUNTER — Encounter: Payer: Self-pay | Admitting: Hematology and Oncology

## 2024-02-07 LAB — BASIC METABOLIC PANEL WITH GFR
Anion gap: 8 (ref 5–15)
BUN: 13 mg/dL (ref 8–23)
CO2: 26 mmol/L (ref 22–32)
Calcium: 8.6 mg/dL — ABNORMAL LOW (ref 8.9–10.3)
Chloride: 106 mmol/L (ref 98–111)
Creatinine, Ser: 0.66 mg/dL (ref 0.44–1.00)
GFR, Estimated: >60 mL/min (ref >60)
Glucose, Bld: 91 mg/dL (ref 70–99)
Potassium: 3.9 mmol/L (ref 3.5–5.1)
Sodium: 139 mmol/L (ref 135–145)

## 2024-02-07 LAB — CBC
HCT: 29.1 % — ABNORMAL LOW (ref 36.0–46.0)
Hemoglobin: 8.9 g/dL — ABNORMAL LOW (ref 12.0–15.0)
MCH: 29 pg (ref 26.0–34.0)
MCHC: 30.6 g/dL (ref 30.0–36.0)
MCV: 94.8 fL (ref 80.0–100.0)
Platelets: 346 K/uL (ref 150–400)
RBC: 3.07 MIL/uL — ABNORMAL LOW (ref 3.87–5.11)
RDW: 16.9 % — ABNORMAL HIGH (ref 11.5–15.5)
WBC: 7 K/uL (ref 4.0–10.5)
nRBC: 0 % (ref 0.0–0.2)

## 2024-02-07 MED ORDER — BISMUTH SUBSALICYLATE 262 MG/15ML PO SUSP: 30.0000 mL | ORAL | Status: DC | PRN

## 2024-02-07 MED ORDER — GABAPENTIN 300 MG PO CAPS
300.0000 mg | ORAL_CAPSULE | Freq: Two times a day (BID) | ORAL | 0 refills | Status: DC
  Filled 2024-02-07: qty 10, 5d supply, fill #0

## 2024-02-07 MED ORDER — CALCIUM POLYCARBOPHIL 625 MG PO TABS
625.0000 mg | ORAL_TABLET | Freq: Every day | ORAL | Status: DC
  Filled 2024-02-07: qty 1

## 2024-02-07 MED ORDER — TRAMADOL HCL 50 MG PO TABS
50.0000 mg | ORAL_TABLET | Freq: Four times a day (QID) | ORAL | 0 refills | Status: AC | PRN
  Filled 2024-02-07: qty 15, 2d supply, fill #0

## 2024-02-07 MED ORDER — ACETAMINOPHEN 500 MG PO TABS: 1000.0000 mg | ORAL_TABLET | Freq: Three times a day (TID) | ORAL | Status: AC | PRN

## 2024-02-07 NOTE — Progress Notes (Signed)
 2 Days Post-Op  Subjective: Had multiple episodes of diarrhea overnight, continues this morning. Tolerating full liquids. Denies nausea/vomiting. Pain controlled.   Objective: Vital signs in last 24 hours: Temp:  [97.4 F (36.3 C)-97.7 F (36.5 C)] 97.5 F (36.4 C) (12/07 0454) Pulse Rate:  [91-96] 96 (12/07 0454) Resp:  [16-18] 18 (12/07 0454) BP: (109-145)/(68-84) 145/84 (12/07 0454) SpO2:  [96 %-100 %] 99 % (12/07 0454) Weight:  [55.1 kg] 55.1 kg (12/07 0500) Last BM Date : 02/05/24  Intake/Output from previous day: 12/06 0701 - 12/07 0700 In: 598.3 [P.O.:420; I.V.:178.3] Out: 125 [Urine:125] Intake/Output this shift: No intake/output data recorded.  PE: General: NAD Neuro: alert and oriented, no focal deficits Resp: normal work of breathing on room air Abdomen: soft, nondistended, appropriate incisional tenderness to palpation. Incisions clean and dry. Extremities: warm and well-perfused   Lab Results:  Recent Labs    02/06/24 0727 02/07/24 0640  WBC 12.5* 7.0  HGB 9.4* 8.9*  HCT 30.8* 29.1*  PLT 366 346   BMET Recent Labs    02/06/24 0727 02/07/24 0640  NA 138 139  K 3.8 3.9  CL 107 106  CO2 23 26  GLUCOSE 120* 91  BUN 12 13  CREATININE 0.72 0.66  CALCIUM  8.5* 8.6*   PT/INR No results for input(s): LABPROT, INR in the last 72 hours. CMP     Component Value Date/Time   NA 139 02/07/2024 0640   NA 144 02/19/2023 1439   K 3.9 02/07/2024 0640   CL 106 02/07/2024 0640   CO2 26 02/07/2024 0640   GLUCOSE 91 02/07/2024 0640   BUN 13 02/07/2024 0640   BUN 27 02/19/2023 1439   CREATININE 0.66 02/07/2024 0640   CREATININE 0.95 01/12/2024 1359   CALCIUM  8.6 (L) 02/07/2024 0640   PROT 6.5 02/03/2024 1449   PROT 6.8 02/19/2023 1439   ALBUMIN 3.2 (L) 02/03/2024 1449   ALBUMIN 4.3 02/19/2023 1439   AST 20 02/03/2024 1449   AST 16 01/12/2024 1359   ALT <5 02/03/2024 1449   ALT <5 01/12/2024 1359   ALKPHOS 73 02/03/2024 1449   BILITOT  <0.2 02/03/2024 1449   BILITOT <0.2 01/12/2024 1359   GFRNONAA >60 02/07/2024 0640   GFRNONAA >60 01/12/2024 1359   GFRAA 79 12/07/2019 0914   GFRAA >60 07/08/2019 1150   Lipase     Component Value Date/Time   LIPASE 156 (H) 03/27/2023 1303       Studies/Results: No results found.  Anti-infectives: Anti-infectives (From admission, onward)    Start     Dose/Rate Route Frequency Ordered Stop   02/05/24 2000  cefoTEtan  (CEFOTAN ) 2 g in sodium chloride  0.9 % 100 mL IVPB        2 g 200 mL/hr over 30 Minutes Intravenous Every 12 hours 02/05/24 1050 02/06/24 0744   02/05/24 0645  cefoTEtan  (CEFOTAN ) 2 g in sodium chloride  0.9 % 100 mL IVPB        2 g 200 mL/hr over 30 Minutes Intravenous On call to O.R. 02/05/24 0620 02/06/24 0744   02/04/24 2000  neomycin  (MYCIFRADIN ) tablet 500 mg        500 mg Oral  Once 02/04/24 0732 02/04/24 1944   02/04/24 2000  metroNIDAZOLE  (FLAGYL ) tablet 500 mg        500 mg Oral  Once 02/04/24 0732 02/04/24 1944   02/04/24 1400  neomycin  (MYCIFRADIN ) tablet 500 mg        500 mg Oral  Once  02/04/24 0732 02/04/24 1421   02/04/24 1400  metroNIDAZOLE  (FLAGYL ) tablet 500 mg        500 mg Oral  Once 02/04/24 0732 02/04/24 1421   02/04/24 1300  neomycin  (MYCIFRADIN ) tablet 500 mg        500 mg Oral  Once 02/04/24 0732 02/04/24 1513   02/04/24 1300  metroNIDAZOLE  (FLAGYL ) tablet 500 mg        500 mg Oral  Once 02/04/24 0732 02/04/24 1513        Assessment/Plan 78 yo female POD2 s/p right hemicolectomy. - Advance to soft diet - Counseled that diarrhea is common initially after right hemicolectomy. Will add fiber and prn peptobismol (patient reports this helps at home). - Ambulate - Multimodal pain control - VTE: lovenox , SCDs - Dispo: anticipate discharge this afternoon if tolerating soft diet and diarrhea improves.    LOS: 3 days    Leonor Dawn, MD [facility] Surgery General, Hepatobiliary and Pancreatic Surgery 02/07/24 8:28  AM

## 2024-02-07 NOTE — Plan of Care (Signed)
  Problem: Activity: Goal: Risk for activity intolerance will decrease Outcome: Progressing   Problem: Coping: Goal: Level of anxiety will decrease Outcome: Progressing   Problem: Pain Managment: Goal: General experience of comfort will improve and/or be controlled Outcome: Progressing   Problem: Safety: Goal: Ability to remain free from injury will improve Outcome: Progressing   Problem: Skin Integrity: Goal: Risk for impaired skin integrity will decrease Outcome: Progressing   Problem: Activity: Goal: Ability to tolerate increased activity will improve Outcome: Progressing   Problem: Clinical Measurements: Goal: Postoperative complications will be avoided or minimized Outcome: Progressing

## 2024-02-08 ENCOUNTER — Telehealth: Payer: Self-pay

## 2024-02-08 ENCOUNTER — Other Ambulatory Visit (HOSPITAL_COMMUNITY): Payer: Self-pay

## 2024-02-08 ENCOUNTER — Encounter (HOSPITAL_COMMUNITY)

## 2024-02-08 NOTE — Transitions of Care (Post Inpatient/ED Visit) (Signed)
 02/08/2024  Name: Andrea [name] MRN: [id number] DOB: 10-09-1945  Today's TOC FU Call Status: Today's TOC FU Call Status:: Successful TOC FU Call Completed TOC FU Call Complete Date: 02/08/24  Patient's Name and Date of Birth confirmed. Name, DOB  Transition Care Management Follow-up Telephone Call Date of Discharge: 02/07/24 Discharge Facility: [facility]) Type of Discharge: Inpatient Admission Primary Inpatient Discharge Diagnosis:: Iron  deficiency anemia due to chronic blood loss How have you been since you were released from the hospital?: Better Any questions or concerns?: No  Items Reviewed: Did you receive and understand the discharge instructions provided?: Yes Medications obtained,verified, and reconciled?: Yes (Medications Reviewed) Any new allergies since your discharge?: No Dietary orders reviewed?: Yes Type of Diet Ordered:: regular Do you have support at home?: Yes People in Home [RPT]: spouse  Medications Reviewed Today: Medications Reviewed Today     Reviewed by Rumalda Alan PENNER, RN (Registered Nurse) on 02/08/24 at 1424  Med List Status: <None>   Medication Order Taking? Sig Documenting Provider Last Dose Status Informant  acetaminophen  (TYLENOL ) 500 MG tablet [id number] Yes Take 2 tablets (1,000 mg total) by mouth every 8 (eight) hours as needed for mild pain (pain score 1-3). [name], MD  Active   amLODipine  (NORVASC ) 5 MG tablet [id number] Yes Take 5 mg by mouth daily. [provider]  Active Self, Pharmacy Records  Ascorbic Acid (VITAMIN C) 1000 MG tablet [id number] Yes Take 1,000 mg by mouth daily.  [provider]  Active Self, Pharmacy Records  cetirizine (ZYRTEC) 10 MG tablet [id number] Yes Take 10 mg by mouth 2 (two) times daily as needed for allergies. [provider]  Active Self, Pharmacy Records  CRANBERRY PO [id number] Yes Take 8,400 mg by mouth daily. With vitamin C [provider]  Active Self,  Pharmacy Records  Cyanocobalamin  (VITAMIN B-12 PO) [id number] Yes Take 2,500 mcg by mouth daily. Dissolvable tablet [provider]  Active Self, Pharmacy Records  fluticasone [facility]) 50 MCG/ACT nasal spray [id number] Yes Place 1 spray into both nostrils daily as needed for allergies or rhinitis. [provider]  Active Self, Pharmacy Records  gabapentin  (NEURONTIN ) 300 MG capsule [id number]  Take 1 capsule (300 mg total) by mouth 2 (two) times daily for 5 days.  Patient not taking: Reported on 02/08/2024   [name], MD  Active   Garlic WAYMON PO) [id number] Yes Take 1 tablet by mouth daily. For cholesterol [provider]  Active Self, Pharmacy Records  metoprolol  succinate (TOPROL -XL) 50 MG 24 hr tablet [id number] Yes Take 1 tablet (50 mg total) by mouth in the morning and at bedtime. Take with or immediately following a meal. Madireddy, Alean SAUNDERS, MD  Active Self, Pharmacy Records           Med Note STEFFI, [place]   Wed Feb 03, 2024  9:11 AM) Patient is taking the extended release twice a day.   Multiple Vitamins-Minerals (BONEUP PO) [id number] Yes Take 2 capsules by mouth 2 (two) times daily. [provider]  Active Self, Pharmacy Records  Multiple Vitamins-Minerals (OCUVITE PO) [id number] Yes Take 1 tablet by mouth daily.  [provider]  Active Self, Pharmacy Records  Omega-3 1000 MG CAPS [id number] Yes Take 2,000 mg by mouth 2 (two) times daily. [provider]  Active Self, Pharmacy Records  omeprazole (PRILOSEC) 20 MG capsule [id number] Yes Take 20 mg by mouth daily. [provider]  Active Self, Pharmacy Records  Probiotic CAPS [id number] Yes  Take 1 capsule by mouth daily.  [provider]  Active Self, Pharmacy Records  traMADol  (ULTRAM ) 50 MG tablet [id number] Yes Take 1-2 tablets (50-100 mg total) by mouth every 6 (six) hours as needed for up to 5 days for severe pain (pain score 7-10). [name], MD  Active    zinc  gluconate 50 MG tablet [id number] Yes Take 50 mg by mouth daily.  [provider]  Active Self, Pharmacy Records            Home Care and Equipment/Supplies: Were Home Health Services Ordered?: No Any new equipment or medical supplies ordered?: No  Functional Questionnaire: Do you need assistance with bathing/showering or dressing?: Yes (husband) Do you need assistance with meal preparation?: Yes (husband) Do you need assistance with eating?: No Do you have difficulty maintaining continence: No (usig bedside commode) Do you need assistance with getting out of bed/getting out of a chair/moving?: No Do you have difficulty managing or taking your medications?: Yes (husband)  Follow up appointments reviewed: PCP Follow-up appointment confirmed?: No (will call today) MD Provider Line [phone] Given: No Specialist Hospital Follow-up appointment confirmed?: Yes Date of Specialist follow-up appointment?: 03/15/24 Follow-Up Specialty Provider:: surgeon Do you need transportation to your follow-up appointment?: No Do you understand care options if your condition(s) worsen?: Yes-patient verbalized understanding  SDOH Interventions Today    Flowsheet Row Most Recent Value  SDOH Interventions   Food Insecurity Interventions Intervention Not Indicated  Housing Interventions Intervention Not Indicated  Transportation Interventions Intervention Not Indicated  Utilities Interventions Intervention Not Indicated   Placed call to patient and reviewed all discharge instructions. Patient has her medications and is taking them as prescribed.  Reports that she is doing really well.  Reports pain to abdominal incision site when moving. Reports dressing is intact with no drainage.  Denies any signs of infections.  Reports pain is under control.  Reviewed all instructions line by line on page 8-9.  Reviewed importance of patient staying hydrated.  Reviewed importance of timely  follow up with PCP and surgeon.  Patient voiced understanding.   Reviewed and offered 30 day TOC program and patient declined.   Alan Ee, RN, BSN, CEN [employer]- Transition of Care Team.  [facility] [phone]

## 2024-02-08 NOTE — Discharge Summary (Signed)
 Physician Discharge Summary   Patient ID: Andrea [name] [id number] 78 y.o. 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 Biopsies showed invasive adenocarcinoma. She saw Dr. Debby and was scheduled for an elective right hemicolectomy on 12/5, however on her preop labs she was found to be anemic with a hemoglobin of 6. Thus she was admitted on 12/3 for a blood transfusion.  Hospital Course: The patient was admitted and received 2 units PRBCs and IV iron , with improvement in her hemoglobin to 7.8. She was taken to the OR on 12/5 by Dr. Debby for a robotic right hemicolectomy. Postoperatively she returned to the med-surg floor in stable condition. Her diet was advanced to liquids and then a soft diet, which she tolerated without nausea or vomiting. She had return of bowel function with loose stools on the evening of POD1. On POD2, she was tolerating solid food, ambulating, and pain was well-controlled. She remained hemodynamically stable. She was examined and deemed appropriate for discharge home.   Consults: Internal medicine  Significant Diagnostic Studies: Surgical pathology pending at time of discharge  Treatments: analgesia: acetaminophen  and tramadol , surgery: robotic right hemicolectomy, and blood transfusion  Discharge Exam: General: NAD Neuro: alert and oriented, no focal deficits Resp: normal work of breathing on room air Abdomen: soft, nondistended, appropriate incisional tenderness to palpation. Incisions clean and  dry. Extremities: warm and well-perfused  Disposition: Discharge disposition: 01-Home or Self Care       Patient Instructions:  Allergies as of 02/07/2024       Reactions   Cortisone Rash   States allegic to oral Cortisone, but not to shot   Ferrous Sulfate Other (See Comments)   Abdominal pains        Medication List     TAKE these medications    acetaminophen  500 MG tablet Commonly known as: TYLENOL  Take 2 tablets (1,000 mg total) by mouth every 8 (eight) hours as needed for mild pain (pain score 1-3).   amLODipine  5 MG tablet Commonly known as: NORVASC  Take 5 mg by mouth daily.   cetirizine 10 MG tablet Commonly known as: ZYRTEC Take 10 mg by mouth 2 (two) times daily as needed for allergies.   CRANBERRY PO Take 8,400 mg by mouth daily. With vitamin C   fluticasone 50 MCG/ACT nasal spray Commonly known as: FLONASE Place 1 spray into both nostrils daily as needed for allergies or rhinitis.   gabapentin  300 MG capsule Commonly known as: NEURONTIN  Take 1 capsule (300 mg total) by mouth 2 (two) times daily for 5 days.   GARLIQUE PO Take 1 tablet by mouth daily. For cholesterol   metoprolol  succinate 50 MG 24 hr tablet Commonly known as: TOPROL -XL Take 1 tablet (50 mg total) by mouth in the morning and at bedtime. Take with or immediately following a meal.   OCUVITE PO Take 1 tablet by mouth daily.   BONEUP PO Take 2 capsules by mouth 2 (two) times daily.   Omega-3 1000 MG Caps Take 2,000 mg by mouth 2 (two) times daily.   omeprazole 20 MG capsule Commonly known  as: PRILOSEC Take 20 mg by mouth daily.   Probiotic Caps Take 1 capsule by mouth daily.   traMADol  50 MG tablet Commonly known as: ULTRAM  Take 1-2 tablets (50-100 mg total) by mouth every 6 (six) hours as needed for up to 5 days for severe pain (pain score 7-10).   VITAMIN B-12 PO Take 2,500 mcg by mouth daily. Dissolvable tablet   vitamin C 1000 MG tablet Take 1,000 mg by mouth  daily.   zinc  gluconate 50 MG tablet Take 50 mg by mouth daily.       Activity: no heavy lifting for 6 weeks Diet: Low fiber diet Wound Care: keep wound clean and dry  Follow-up with Dr. Debby on 03/15/24.  Signed: Leonor LITTIE Dawn 02/08/2024 10:18 AM

## 2024-02-09 LAB — SURGICAL PATHOLOGY

## 2024-02-10 ENCOUNTER — Encounter: Payer: Self-pay | Admitting: Hematology and Oncology

## 2024-02-10 ENCOUNTER — Other Ambulatory Visit (HOSPITAL_BASED_OUTPATIENT_CLINIC_OR_DEPARTMENT_OTHER): Payer: Self-pay

## 2024-02-10 ENCOUNTER — Encounter (HOSPITAL_BASED_OUTPATIENT_CLINIC_OR_DEPARTMENT_OTHER): Payer: Self-pay | Admitting: Emergency Medicine

## 2024-02-10 ENCOUNTER — Ambulatory Visit (HOSPITAL_BASED_OUTPATIENT_CLINIC_OR_DEPARTMENT_OTHER)
Admission: EM | Admit: 2024-02-10 | Discharge: 2024-02-10 | Disposition: A | Discharge status: Home / Self Care | Attending: Family Medicine | Admitting: Family Medicine

## 2024-02-10 DIAGNOSIS — J0100 Acute maxillary sinusitis, unspecified: Secondary | ICD-10-CM

## 2024-02-10 MED ORDER — PROMETHAZINE-DM 6.25-15 MG/5ML PO SYRP
5.0000 mL | ORAL_SOLUTION | Freq: Four times a day (QID) | ORAL | 0 refills | Status: DC | PRN
  Filled 2024-02-10: qty 118, 6d supply, fill #0

## 2024-02-10 MED ORDER — AMOXICILLIN 875 MG PO TABS
875.0000 mg | ORAL_TABLET | Freq: Two times a day (BID) | ORAL | 0 refills | Status: DC
  Filled 2024-02-10: qty 14, 7d supply, fill #0

## 2024-02-10 NOTE — ED Provider Notes (Signed)
 [facility]    CSN: [id number] Arrival date & time: 02/10/24  1427      History   Chief Complaint Chief Complaint  Patient presents with   Cough   Headache    HPI Andrea [name] is a 78 y.o. female.   Pt is a an 78 year old female that c/o productive cough (yellow), congestion and sinus headache worsening over the past  2 days.  Reporting some teeth pain.  Has been taking OTC meds without relief.  Patient recently had surgery and when she coughs this hurts her incision area.  No fever, chills, body aches or night sweats.    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   Dyslipidemia 11/26/2022   Essential hypertension 12/28/2014   GERD (gastroesophageal reflux disease)    Headache    r/t sinus   Hearing loss 01/20/2022   History of cardiomyopathy 10/27/2017   History of kidney stones    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   Hyperlipemia 12/22/2016   Hyperlipidemia    Hypertension    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   11/2018-High grade-lumpectomy-radiation 02/2019-03/14/2019   Mixed hyperlipidemia 01/20/2022   Palpitations    Paroxysmal A-fib (HCC) 11/2022   Personal history of radiation therapy     Patient Active Problem List   Diagnosis Date Noted   Anemia 02/04/2024   Preop cardiovascular exam 02/02/2024   Mild mitral regurgitation 02/02/2024   Adenocarcinoma, colon (HCC) 01/12/2024   Iron  deficiency anemia 06/11/2023   Blood glucose elevated 02/19/2023    Iron  deficiency anemia due to chronic blood loss 02/19/2023   Dyslipidemia 11/26/2022   Paroxysmal A-fib (HCC) 11/2022   Coronary arteriosclerosis 01/20/2022   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   Hearing loss 01/20/2022   Mixed hyperlipidemia 01/20/2022   Breast cancer (HCC)    Cardiomyopathy (HCC)    Complication of anesthesia    GERD (gastroesophageal reflux disease)    Headache    HOH (hard of hearing)    Hyperlipidemia    Hypertension    Palpitations    Personal history of radiation therapy    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   Cancer (HCC) 11/2018   History of cardiomyopathy 10/27/2017   Carotid bruit 10/27/2017   Coronary atherosclerosis 12/22/2016   Hyperlipemia 12/22/2016   Essential hypertension 12/28/2014    Past Surgical History:  Procedure Laterality Date   ABDOMINAL HYSTERECTOMY     with reconstructive surgery   BREAST LUMPECTOMY Left    BREAST LUMPECTOMY WITH RADIOACTIVE SEED LOCALIZATION Left 01/04/2019   Procedure: LEFT BREAST LUMPECTOMY WITH RADIOACTIVE SEED LOCALIZATION;  Surgeon: Vanderbilt Ned, MD;  Location: [facility];  Service: General;  Laterality: Left;   COLONOSCOPY     INNER EAR SURGERY Left    RE-EXCISION OF BREAST LUMPECTOMY Left 01/18/2019   Procedure: RE-EXCISION OF LEFT BREAST LUMPECTOMY;  Surgeon: Vanderbilt Ned, MD;  Location: [facility];  Service:  General;  Laterality: Left;   ROTATOR CUFF REPAIR Right    WRIST SURGERY Right    cyst removal    OB History   No obstetric history on file.      Home Medications    Prior to Admission medications   Medication Sig Start Date End Date Taking? Authorizing Provider  amoxicillin  (AMOXIL ) 875 MG tablet Take 1 tablet (875 mg total) by mouth 2 (two) times daily for 7 days. 02/10/24 02/17/24 Yes Ari Engelbrecht A, FNP  promethazine -dextromethorphan (PROMETHAZINE -DM) 6.25-15 MG/5ML syrup Take 5 mLs by mouth 4 (four) times  daily as needed for cough. 02/10/24  Yes Raylynne Cubbage A, FNP  acetaminophen  (TYLENOL ) 500 MG tablet Take 2 tablets (1,000 mg total) by mouth every 8 (eight) hours as needed for mild pain (pain score 1-3). 02/07/24   [name], MD  amLODipine  (NORVASC ) 5 MG tablet Take 5 mg by mouth daily.    [provider]  Ascorbic Acid (VITAMIN C) 1000 MG tablet Take 1,000 mg by mouth daily.     [provider]  cetirizine (ZYRTEC) 10 MG tablet Take 10 mg by mouth 2 (two) times daily as needed for allergies.    [provider]  CRANBERRY PO Take 8,400 mg by mouth daily. With vitamin C    [provider]  Cyanocobalamin  (VITAMIN B-12 PO) Take 2,500 mcg by mouth daily. Dissolvable tablet    [provider]  fluticasone (FLONASE) 50 MCG/ACT nasal spray Place 1 spray into both nostrils daily as needed for allergies or rhinitis.    [provider]  gabapentin  (NEURONTIN ) 300 MG capsule Take 1 capsule (300 mg total) by mouth 2 (two) times daily for 5 days. Patient not taking: Reported on 02/08/2024 02/07/24 02/13/24  [name], MD  Garlic (GARLIQUE PO) Take 1 tablet by mouth daily. For cholesterol    [provider]  metoprolol  succinate (TOPROL -XL) 50 MG 24 hr tablet Take 1 tablet (50 mg total) by mouth in the morning and at bedtime. Take with or immediately following a meal. 02/02/24   Madireddy, Alean SAUNDERS, MD  Multiple Vitamins-Minerals (BONEUP PO) Take 2 capsules by mouth 2 (two) times daily.    [provider]  Multiple Vitamins-Minerals (OCUVITE PO) Take 1 tablet by mouth daily.     [provider]  Omega-3 1000 MG CAPS Take 2,000 mg by mouth 2 (two) times daily.    [provider]  omeprazole (PRILOSEC) 20 MG capsule Take 20 mg by mouth daily.    [provider]  Probiotic CAPS Take 1 capsule by mouth daily.     [provider]  traMADol  (ULTRAM ) 50 MG tablet Take 1-2 tablets (50-100 mg total) by  mouth every 6 (six) hours as needed for up to 5 days for severe pain (pain score 7-10). 02/07/24 02/12/24  [name], MD  zinc  gluconate 50 MG tablet Take 50 mg by mouth daily.     [provider]    Family History Family History  Problem Relation Age of Onset   Stroke Mother    Hypertension Mother    Kidney failure Father        Neuremia kidney poisoning   Valvular heart disease Brother    Emphysema Brother    Hearing loss Brother        hearing aids   Tuberculosis Maternal Grandmother    Alcohol abuse Maternal Grandfather        phlebitis   Throat cancer  Paternal Grandmother     Social History Social History   Tobacco Use   Smoking status: Never   Smokeless tobacco: Never  Vaping Use   Vaping status: Never Used  Substance Use Topics   Alcohol use: Yes    Comment: rare   Drug use: Never     Allergies   Cortisone and Ferrous sulfate   Review of Systems Review of Systems  Respiratory:  Positive for cough.   Neurological:  Positive for headaches.     Physical Exam Triage Vital Signs ED Triage Vitals  Encounter Vitals Group     BP 02/10/24 1534 118/81     Girls Systolic BP Percentile --      Girls Diastolic BP Percentile --      Boys Systolic BP Percentile --      Boys Diastolic BP Percentile --      Pulse Rate 02/10/24 1534 (!) 116     Resp 02/10/24 1534 16     Temp 02/10/24 1534 97.6 F (36.4 C)     Temp Source 02/10/24 1534 Oral     SpO2 02/10/24 1534 96 %     Weight 02/10/24 1536 119 lb (54 kg)     Height 02/10/24 1536 5' 1 (1.549 m)     Head Circumference --      Peak Flow --      Pain Score 02/10/24 1536 4     Pain Loc --      Pain Education --      Exclude from Growth Chart --    No data found.  Updated Vital Signs BP 118/81 (BP Location: Right Arm)   Pulse (!) 116   Temp 97.6 F (36.4 C) (Oral)   Resp 16   Ht 5' 1 (1.549 m)   Wt 119 lb (54 kg)   SpO2 96%   BMI 22.48 kg/m   Visual Acuity Right Eye Distance:    Left Eye Distance:   Bilateral Distance:    Right Eye Near:   Left Eye Near:    Bilateral Near:     Physical Exam Constitutional:      General: She is not in acute distress.    Appearance: Normal appearance. She is not ill-appearing, toxic-appearing or diaphoretic.  HENT:     Head: Normocephalic and atraumatic.     Right Ear: Tympanic membrane and ear canal normal.     Left Ear: Tympanic membrane and ear canal normal.     Nose: Congestion present.     Mouth/Throat:     Pharynx: Oropharynx is clear.  Eyes:     Conjunctiva/sclera: Conjunctivae normal.  Cardiovascular:     Rate and Rhythm: Normal rate and regular rhythm.     Pulses: Normal pulses.     Heart sounds: Normal heart sounds.  Pulmonary:     Effort: Pulmonary effort is normal.     Breath sounds: Normal breath sounds.  Skin:    General: Skin is warm and dry.  Neurological:     Mental Status: She is alert.  Psychiatric:        Mood and Affect: Mood normal.      UC Treatments / Results  Labs (all labs ordered are listed, but only abnormal results are displayed) Labs Reviewed - No data to display  EKG   Radiology No results found.  Procedures Procedures (including critical care time)  Medications Ordered in UC Medications - No data to display  Initial Impression / Assessment and Plan /  UC Course  I have reviewed the triage vital signs and the nursing notes.  Pertinent labs & imaging results that were available during my care of the patient were reviewed by me and considered in my medical decision making (see chart for details).     Acute sinusitis- treating for sinus infection with Amoxicillin . Cough syrup as needed. Rest, Hydrate and follow up as needed.  Final Clinical Impressions(s) / UC Diagnoses   Final diagnoses:  Acute non-recurrent maxillary sinusitis     Discharge Instructions      Treating you for a sinus affection.  Antibiotics as prescribed.  Cough medication as needed. Rest,  hydrate and follow-up as needed    ED Prescriptions     Medication Sig Dispense Auth. Provider   amoxicillin  (AMOXIL ) 875 MG tablet Take 1 tablet (875 mg total) by mouth 2 (two) times daily for 7 days. 14 tablet Naomie Crow A, FNP   promethazine -dextromethorphan (PROMETHAZINE -DM) 6.25-15 MG/5ML syrup Take 5 mLs by mouth 4 (four) times daily as needed for cough. 118 mL Adah Corning A, FNP      PDMP not reviewed this encounter.   Adah Corning LABOR, FNP 02/10/24 [phone]

## 2024-02-10 NOTE — ED Triage Notes (Signed)
 Pt c/o productive cough (yellow), congestion and sinus headache x's 2 days.  Has been taking OTC meds without relief

## 2024-02-10 NOTE — Discharge Instructions (Signed)
 Treating you for a sinus affection.  Antibiotics as prescribed.  Cough medication as needed. Rest, hydrate and follow-up as needed

## 2024-02-15 ENCOUNTER — Ambulatory Visit

## 2024-02-15 VITALS — BP 110/62 | HR 83 | Temp 97.0°F | Resp 14 | Ht 61.0 in | Wt 119.0 lb

## 2024-02-15 DIAGNOSIS — D500 Iron deficiency anemia secondary to blood loss (chronic): Secondary | ICD-10-CM

## 2024-02-15 DIAGNOSIS — C189 Malignant neoplasm of colon, unspecified: Secondary | ICD-10-CM | POA: Diagnosis not present

## 2024-02-15 NOTE — Patient Instructions (Signed)
°  VISIT SUMMARY: Today, you had a follow-up visit after your partial colectomy for colorectal cancer. You are recovering well with no current pain and are managing your anemia and hypertension effectively.  YOUR PLAN: COLORECTAL ADENOCARCINOMA, POST-RESECTION: You had a partial colectomy to remove a cancerous mass in your colon. The surgery was successful, and you are currently asymptomatic. -Follow up with your surgeon and oncologist on December 22nd for further recommendations.  IRON  DEFICIENCY ANEMIA DUE TO CHRONIC BLOOD LOSS: You have anemia due to blood loss from your colorectal cancer. Your hemoglobin level was 8.9 g/dL after receiving blood transfusions and iron  therapy. -Await follow-up lab work on December 22nd to assess your hemoglobin levels.  HYPERTENSION: Your blood pressure is well-controlled with your current medications. -Continue taking your antihypertensive medications: amlodipine  5 mg daily and Toprol  XL 50 mg twice daily.                      Contains text generated by [employer].                                 Contains text generated by [employer].

## 2024-02-15 NOTE — Progress Notes (Unsigned)
 Subjective:  Patient ID: Andrea [name], female    DOB: 1945/11/18  Age: 78 y.o. MRN: [id number]  Chief Complaint  Patient presents with   Hospitalization Follow-up    HPI: Discussed the use of AI scribe software for clinical note transcription with the patient, who gave verbal consent to proceed.  History of Present Illness   Andrea [name] is an 78 year old female with colorectal cancer who presents for post-operative follow-up after a partial colectomy. She is accompanied by her husband, Dick.  Post-operative status - Underwent partial robotic-assisted laparoscopic colectomy on February 05, 2024, for invasive colorectal adenocarcinoma at [facility]. - Surgical procedure included removal of a colon mass, two polyps, and the appendix. - Discharged from the hospital on February 07, 2024. - No current pain; managed with Tylenol  only. - Avoiding tramadol  and other stronger pain medications to prevent constipation. SHE ONLY TOOK 6 PILLS AND HAS 10 LEFT. - No constipation; bowel movements are loose but not runny. - Eating better and maintaining adequate hydration.  Pre-operative anemia - Experienced significant anemia prior to surgery due to slow gastrointestinal bleeding from the colon mass. - Required blood transfusions and iron  supplementation. - Hemoglobin measured at 8.9 g/dL on February 07, 2024, after transfusions and iron  therapy.  Fall risk and bone health - Sustained a fall in the hospital due to clutter in the room; no injuries occurred. - Bone density tests indicate strong bone health.  Medication use and caution - Current medications include amlodipine  5 mg daily, Toprol  XL 50 mg twice daily, and Prilosec 20 mg daily. - Cautious with medication use due to awareness of adverse effects experienced by others.            02/08/2024    2:37 PM 11/23/2023   10:30 AM 11/18/2023   11:00 AM 10/21/2023   11:25 AM 10/01/2023    9:21 AM  Depression screen PHQ  2/9  Decreased Interest 0 0 0 0 0  Down, Depressed, Hopeless 0 0 0 0 0  PHQ - 2 Score 0 0 0 0 0        02/08/2024    2:36 PM  Fall Risk   Falls in the past year? 1  Number falls in past yr: 0  Injury with Fall? 0    Patient Care Team: Teressa Harrie HERO, FNP as PCP - General (Family Medicine) [name], MD as Consulting Physician (Hematology and Oncology) [facility], [name], Jennifer SAUNDERS, MD as Consulting Physician (Cardiology) Vanderbilt Ned, MD as Consulting Physician (Oncology) Latisha Medford, MD as Consulting Physician (Obstetrics and Gynecology)   Review of Systems  Constitutional:  Negative for chills, fatigue and fever.  HENT:  Negative for congestion, ear pain and sore throat.   Respiratory:  Negative for cough and shortness of breath.   Cardiovascular:  Negative for chest pain and palpitations.  Gastrointestinal:  Negative for abdominal pain, constipation, diarrhea, nausea and vomiting.  Endocrine: Negative for polydipsia, polyphagia and polyuria.  Genitourinary:  Negative for difficulty urinating and dysuria.  Musculoskeletal:  Negative for arthralgias, back pain and myalgias.  Skin:  Negative for rash.  Neurological:  Negative for headaches.  Psychiatric/Behavioral:  Negative for dysphoric mood. The patient is not nervous/anxious.     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   Dyslipidemia 11/26/2022   Essential hypertension 12/28/2014   GERD (gastroesophageal reflux disease)    Headache    r/t sinus   Hearing  loss 01/20/2022   History of cardiomyopathy 10/27/2017   History of kidney stones    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   Hyperlipemia 12/22/2016   Hyperlipidemia    Hypertension    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   11/2018-High grade-lumpectomy-radiation 02/2019-03/14/2019   Mixed hyperlipidemia 01/20/2022   Palpitations    Paroxysmal A-fib (HCC) 11/2022   Personal history of radiation therapy    Past Surgical History:  Procedure Laterality Date   ABDOMINAL HYSTERECTOMY     with reconstructive surgery   BREAST LUMPECTOMY Left    BREAST LUMPECTOMY WITH RADIOACTIVE SEED LOCALIZATION Left 01/04/2019   Procedure: LEFT BREAST LUMPECTOMY WITH RADIOACTIVE SEED LOCALIZATION;  Surgeon: Vanderbilt Ned, MD;  Location: [facility];  Service: General;  Laterality: Left;   COLONOSCOPY     INNER EAR SURGERY Left    RE-EXCISION OF BREAST LUMPECTOMY Left 01/18/2019   Procedure: RE-EXCISION OF LEFT BREAST LUMPECTOMY;  Surgeon: Vanderbilt Ned, MD;  Location: [facility];  Service: General;  Laterality: Left;   ROTATOR CUFF REPAIR Right    WRIST SURGERY Right    cyst removal    Family History  Problem Relation Age of Onset   Stroke Mother    Hypertension Mother    Kidney failure Father        Neuremia kidney poisoning   Valvular heart disease Brother    Emphysema Brother    Hearing loss Brother        hearing aids   Tuberculosis Maternal Grandmother    Alcohol abuse Maternal Grandfather        phlebitis   Throat cancer Paternal Grandmother    Social History   Socioeconomic History   Marital status: Married    Spouse name: Carlin Null   Number of children: 0   Years of education: Not on file   Highest education level: Some college, no degree  Occupational History   Occupation: retired  Tobacco Use   Smoking status: Never   Smokeless tobacco: Never  Vaping Use   Vaping status:  Never Used  Substance and Sexual Activity   Alcohol use: Yes    Comment: rare   Drug use: Never   Sexual activity: Not Currently    Birth control/protection: Surgical  Other Topics Concern   Not on file  Social History Narrative   Not on file   Social Drivers of Health   Tobacco Use: Low Risk (02/15/2024)   Patient History    Smoking Tobacco Use: Never    Smokeless Tobacco Use: Never    Passive Exposure: Not on file  Financial Resource Strain: Low Risk (02/19/2023)   Overall Financial Resource Strain (CARDIA)    Difficulty of Paying Living Expenses: Not hard at all  Food Insecurity: No Food Insecurity (02/08/2024)   Epic    Worried About Radiation Protection Practitioner of Food in the Last Year: Never true    Ran Out of Food in the Last Year: Never true  Transportation Needs: No Transportation Needs (02/08/2024)   Epic  Lack of Transportation (Medical): No    Lack of Transportation (Non-Medical): No  Physical Activity: Inactive (02/19/2023)   Exercise Vital Sign    Days of Exercise per Week: 0 days    Minutes of Exercise per Session: 0 min  Stress: No Stress Concern Present (11/26/2022)   Received from [facility] of Occupational Health - Occupational Stress Questionnaire    Feeling of Stress : Not at all  Social Connections: Moderately Isolated (02/03/2024)   Social Connection and Isolation Panel    Frequency of Communication with Friends and Family: Three times a week    Frequency of Social Gatherings with Friends and Family: Three times a week    Attends Religious Services: Never    Active Member of Clubs or Organizations: No    Attends Banker Meetings: Never    Marital Status: Married  Depression (PHQ2-9): Low Risk (02/08/2024)   Depression (PHQ2-9)    PHQ-2 Score: 0  Alcohol Screen: Low Risk (03/06/2023)   Alcohol Screen    Last Alcohol Screening Score (AUDIT): 1  Housing: Unknown (02/08/2024)   Epic    Unable to Pay for  Housing in the Last Year: No    Number of Times Moved in the Last Year: Not on file    Homeless in the Last Year: No  Utilities: Not At Risk (02/08/2024)   Epic    Threatened with loss of utilities: No  Health Literacy: Adequate Health Literacy (02/19/2023)   B1300 Health Literacy    Frequency of need for help with medical instructions: Never    Objective:  BP 110/62   Pulse 83   Temp (!) 97 F (36.1 C)   Resp 14   Ht 5' 1 (1.549 m)   Wt 119 lb (54 kg)   SpO2 98%   BMI 22.48 kg/m      02/15/2024    2:32 PM 02/10/2024    3:36 PM 02/10/2024    3:34 PM  BP/Weight  Systolic BP 110  881  Diastolic BP 62  81  Wt. (Lbs) 119 119   BMI 22.48 kg/m2 22.48 kg/m2     Physical Exam Vitals and nursing note reviewed.  Constitutional:      Appearance: Normal appearance.  HENT:     Head: Normocephalic and atraumatic.  Neurological:     Mental Status: She is alert.     {Perform Simple Foot Exam  Perform Detailed exam:1} {Insert foot Exam (Optional):30965}   Lab Results  Component Value Date   WBC 7.0 02/07/2024   HGB 8.9 (L) 02/07/2024   HCT 29.1 (L) 02/07/2024   PLT 346 02/07/2024   GLUCOSE 91 02/07/2024   CHOL 180 11/25/2022   TRIG 73 11/25/2022   HDL 90 11/25/2022   LDLCALC 76 11/25/2022   ALT <5 02/03/2024   AST 20 02/03/2024   NA 139 02/07/2024   K 3.9 02/07/2024   CL 106 02/07/2024   CREATININE 0.66 02/07/2024   BUN 13 02/07/2024   CO2 26 02/07/2024   TSH 1.030 11/25/2022   HGBA1C 5.3 02/19/2023    Results for orders placed or performed during the hospital encounter of 02/03/24  CBC with Differential   Collection Time: 02/03/24  2:49 PM  Result Value Ref Range   WBC 6.3 4.0 - 10.5 K/uL   RBC 2.16 (L) 3.87 - 5.11 MIL/uL   Hemoglobin 6.0 (LL) 12.0 - 15.0 g/dL   HCT 79.7 (L) 63.9 - 53.9 %   MCV  93.5 80.0 - 100.0 fL   MCH 27.8 26.0 - 34.0 pg   MCHC 29.7 (L) 30.0 - 36.0 g/dL   RDW 82.7 (H) 88.4 - 84.4 %   Platelets 439 (H) 150 - 400 K/uL   nRBC  0.0 0.0 - 0.2 %   Neutrophils Relative % 73 %   Neutro Abs 4.7 1.7 - 7.7 K/uL   Lymphocytes Relative 14 %   Lymphs Abs 0.9 0.7 - 4.0 K/uL   Monocytes Relative 10 %   Monocytes Absolute 0.7 0.1 - 1.0 K/uL   Eosinophils Relative 1 %   Eosinophils Absolute 0.0 0.0 - 0.5 K/uL   Basophils Relative 1 %   Basophils Absolute 0.0 0.0 - 0.1 K/uL   Immature Granulocytes 1 %   Abs Immature Granulocytes 0.03 0.00 - 0.07 K/uL  Comprehensive metabolic panel   Collection Time: 02/03/24  2:49 PM  Result Value Ref Range   Sodium 139 135 - 145 mmol/L   Potassium 3.8 3.5 - 5.1 mmol/L   Chloride 104 98 - 111 mmol/L   CO2 23 22 - 32 mmol/L   Glucose, Bld 102 (H) 70 - 99 mg/dL   BUN 26 (H) 8 - 23 mg/dL   Creatinine, Ser 9.13 0.44 - 1.00 mg/dL   Calcium  8.9 8.9 - 10.3 mg/dL   Total Protein 6.5 6.5 - 8.1 g/dL   Albumin 3.2 (L) 3.5 - 5.0 g/dL   AST 20 15 - 41 U/L   ALT <5 0 - 44 U/L   Alkaline Phosphatase 73 38 - 126 U/L   Total Bilirubin <0.2 0.0 - 1.2 mg/dL   GFR, Estimated >39 >39 mL/min   Anion gap 12 5 - 15  Prepare RBC (crossmatch)   Collection Time: 02/03/24  2:49 PM  Result Value Ref Range   Order Confirmation      ORDER PROCESSED BY BLOOD BANK Performed at [facility], [address] [place], [place] [address]   Type and screen [facility]   Collection Time: 02/03/24  3:36 PM  Result Value Ref Range   ABO/RH(D) O POS    Antibody Screen NEG    Sample Expiration 02/06/2024,2359    Unit Number [id number]    Blood Component Type RED CELLS,LR    Unit division 00    Status of Unit ISSUED,FINAL    Transfusion Status OK TO TRANSFUSE    Crossmatch Result Compatible    Unit Number [id number]    Blood Component Type RBC LR PHER2    Unit division 00    Status of Unit ISSUED,FINAL    Transfusion Status OK TO TRANSFUSE    Crossmatch Result      Compatible Performed at [facility], [address], [place], [place] [address]    BPAM [facility]   Collection Time: 02/03/24  3:36 PM  Result Value Ref Range   ISSUE DATE / TIME 797487967968    Blood Product Unit Number [id number]    PRODUCT CODE [id number]    Unit Type and Rh 5100    Blood Product Expiration Date 202512202359    ISSUE DATE / TIME [id number]    Blood Product Unit Number [id number]    PRODUCT CODE [id number]    Unit Type and Rh 5100    Blood Product Expiration Date 202512222359   Prepare RBC (crossmatch)   Collection Time: 02/03/24  5:43 PM  Result Value Ref Range   Order Confirmation      ORDER PROCESSED BY  BLOOD BANK Performed at [facility], [address], [place], [place] [address]   Vitamin B12   Collection Time: 02/03/24  7:03 PM  Result Value Ref Range   Vitamin B-12 >4,000 (H) 180 - 914 pg/mL  Folate   Collection Time: 02/03/24  7:03 PM  Result Value Ref Range   Folate 11.9 >5.9 ng/mL  Iron  and TIBC   Collection Time: 02/03/24  7:03 PM  Result Value Ref Range   Iron  12 (L) 28 - 170 ug/dL   TIBC 796 (L) 749 - 549 ug/dL   Saturation Ratios 6 (L) 10.4 - 31.8 %   UIBC 191 ug/dL  Ferritin   Collection Time: 02/03/24  7:03 PM  Result Value Ref Range   Ferritin 60 11 - 307 ng/mL  Reticulocytes   Collection Time: 02/03/24  7:03 PM  Result Value Ref Range   Retic Ct Pct 2.4 0.4 - 3.1 %   RBC. 2.20 (L) 3.87 - 5.11 MIL/uL   Retic Count, Absolute 52.8 19.0 - 186.0 K/uL   Immature Retic Fract 23.5 (H) 2.3 - 15.9 %  CBC   Collection Time: 02/04/24  1:49 AM  Result Value Ref Range   WBC 5.3 4.0 - 10.5 K/uL   RBC 2.72 (L) 3.87 - 5.11 MIL/uL   Hemoglobin 7.8 (L) 12.0 - 15.0 g/dL   HCT 75.8 (L) 63.9 - 53.9 %   MCV 88.6 80.0 - 100.0 fL   MCH 28.7 26.0 - 34.0 pg   MCHC 32.4 30.0 - 36.0 g/dL   RDW 82.8 (H) 88.4 - 84.4 %   Platelets 408 (H) 150 - 400 K/uL   nRBC 0.0 0.0 - 0.2 %  Hemoglobin and hematocrit, blood   Collection Time: 02/04/24  1:05 PM  Result Value Ref Range   Hemoglobin 7.8 (L) 12.0 - 15.0 g/dL    HCT 76.1 (L) 63.9 - 46.0 %  Prepare RBC (crossmatch)   Collection Time: 02/04/24  1:57 PM  Result Value Ref Range   Order Confirmation      BB SAMPLE OR UNITS ALREADY AVAILABLE Performed at [facility], [address] [place], [place] [address]   Hemoglobin and hematocrit, blood   Collection Time: 02/04/24  8:46 PM  Result Value Ref Range   Hemoglobin 10.5 (L) 12.0 - 15.0 g/dL   HCT 67.4 (L) 63.9 - 53.9 %  Surgical pathology   Collection Time: 02/05/24  9:31 AM  Result Value Ref Range   SURGICAL PATHOLOGY      SURGICAL PATHOLOGY **** THIS IS AN ADDENDUM REPORT **** CASE: [id number] PATIENT: [facility] Surgical Pathology Report **********Addendum **********  Reason for Addendum #1:  Immunohistochemistry results  Clinical History: Colon cancer (nt)     FINAL MICROSCOPIC DIAGNOSIS:  A. COLON, RIGHT, COLECTOMY: - Invasive colorectal adenocarcinoma with mucinous features. - Metastatic adenocarcinoma involves one of twenty-two lymph nodes (1/22). - See cancer summary below. - Incidental tubular adenoma measuring 0.5 cm. - Appendix with fibrous obliteration of the distal lumen. - Tattoo pigment.  CASE SUMMARY: COLON AND RECTUM RESECTION Standard(s): AJCC 8  SPECIMEN Procedure: Right hemicolectomy  TUMOR Tumor Site: Ascending colon Histologic Type: Adenocarcinoma with mucinous features Histologic Grade: G2, moderately differentiated Tumor Size: Greatest dimension: 9.2 cm Tumor Extent: Invades through muscularis propria into the pericolic tissue Macrosco pic Tumor Perforation: Not identified Lymphatic and/or Vascular Invasion: Present, small vessel Perineural Invasion: Not identified Tumor Budding Score: Low (0-4) Treatment Effect: No known presurgical therapy  MARGINS Margin  Status for Invasive Carcinoma: All margins negative for invasive carcinoma Margin Status for Non-Invasive Tumor: All margins negative for high-grade  dysplasia/intramucosal carcinoma and low-grade dysplasia  REGIONAL LYMPH NODES Regional Lymph Node Status: Tumor present in regional lymph node(s)           Number of Lymph Nodes with Tumor: 1           Number of Lymph Nodes Examined: 22  Tumor Deposits: Not identified  DISTANT METASTASES Distant Site(s) Involved, if applicable: Not applicable  PATHOLOGIC STAGE CLASSIFICATION (pTNM, AJCC 8th Edition): Modified Classification: Not applicable pT3 pN1a pM - Not applicable  ADDITIONAL FINDINGS - incidental tubular adenoma  SPECIAL STUDIES Results of immunohistochemistry for MMR (mismatch rep air proteins) will be resulted in an addendum. (v4.4.0.1)  GROSS DESCRIPTION:  Specimen: Received fresh labeled right colon Specimen integrity: Intact, with 2 stapled resection margins Specimen length: 9.1 cm of terminal ileum and 14.5 cm right colon Mesorectal intactness: Nonapplicable Tumor location: Within the proximal ascending, with the underlying serosa and adipose tissue moderately puckered.  External surface is inked black. Tumor size: There is a 9.2 x 8.2 x 3.6 cm tan-red to yellow, firm, exophytic polypoid lesion identified.  Lesion overlies the appendiceal orifice, and just distal to the ileocecal valve Percent of bowel circumference involved: Approximately 80% of the cecum Tumor distance to margins:                      Proximal: 6.5 cm                      Distal: 10.0 cm                      Mesenteric (sigmoid and transverse): Not applicable                      Radial (posterior ascending, posterior descending; lateral and posterior mid-rectum; and  entire lower 1/3 rectum): 5.0 cm Macroscopic extent of tumor invasion: Sectioning through the lesion reveals a tan-pink, solid, softened cut surface.  Lesion invades the muscularis propria, with focal possible extension into the underlying adipose tissue.  The lesion abuts the inked serosal surface. Total presumed lymph  nodes: 22 tan-pink to gray possible lymph nodes are identified, ranging from 0.2 cm to 2.6 x 1.8 x 1.3 cm.  Sectioning through the largest possible lymph nodes reveals 3 adjacent nodes. Extramural satellite tumor nodules: Not identified Mucosal polyp(s): 0.5 cm tan-pink sessile polyp is identified, approximately 0.8 cm from the distal resection margin. Additional findings: The uninvolved mucosa is tan-pink with normal folding.  Focal area of gray-blue discoloration is noted distal to the lesion, suggestive of tattoo powder.  The appendix is present, and measures 5.5 cm in length and up to 0.8 cm in diameter.  The mucosa is tan-pink, the wall measures up  to 0.3 cm in thickness, the lumen is pinpoint. Block summary: 1 = proximal margin 2 = distal margin 3 = mucosal polyp 4-8 = representative sections of lesion 9 = uninvolved mucosa 10 = appendix 11-13 = 5 possible lymph nodes, each 14 = 2 possible lymph nodes, differentially inked and bisected 15-18 = 1 possible lymph node bisected, each 19-21 = 3 adjacent possible lymph nodes, trisected  SHIRLEEN 02/05/2024)    Final Diagnosis performed by Rexene Daily, MD.   Electronically signed 02/08/2024 Technical component performed at [facility], [address], [place], [place] [address].  Professional component performed at [facility]. [address] [place], [place] [address]  Immunohistochemistry Technical component (if applicable) was performed at [employer]. [address], [place], [place] [address].  IMMUNOHISTOCHEMISTRY DISCLAIMER (if applicable): Some of these immunohistochemical stains ma y have been developed and the performance characteristics determine by [facility]. Some may not have been cleared or approved by the U.S. Food and Drug Administration. The FDA has determined that such clearance or approval is not necessary. This test is  used for clinical purposes. It should not be regarded as investigational or for research. This laboratory is certified under the Clinical Laboratory Improvement Amendments of 1988 (CLIA-88) as qualified to perform high complexity clinical laboratory testing.  The controls stained appropriately.  ADDENDUM:  Mismatch Repair Protein (IHC)  SUMMARY INTERPRETATION: NORMAL  There is preserved expression of the major MMR proteins. There is a very low probability that microsatellite instability (MSI) is present. However, certain clinically significant MMR protein mutations may result in preservation of nuclear expression. It is recommended that the preservation of protein expression be correlated with molecular  based MSI testing.  IHC EXPRESSION RESULTS TEST           RESULT MLH1:          Preserved nuclear expression MSH2:          Preserved nuclear expression MSH6:          Preserved nuclear expression PMS2:          Preserved nuclear expression  References: 1. Guidelines on Genetic Evaluation and Management of Lynch Syndrome: A Consensus Statement by the US  Multi-Society Task Force on Colorectal Cancer Gwenn HERO. Louvenia , MD, and other . Am JINNY Bruns 2014; [phone]; doi: 10.1038/ajg.2014.186; published online 21 September 2012 2. Outcomes of screening endometrial cancer patients for Lynch syndrome by patient-administered checklist. Blondie MS, and others. Gynecol Oncol 2013;131(3):619-623. 3. Muir-Torre syndrome (MTS): An update and approach to diagnosis and management. Jenkins EMERSON Rush, MD and others. J Am Acad Dermatol [phone] Some of these immunohistochemical stains may have been developed and the performance characteristics determined by [employer] [facility]. Some may not have been cleared or approved by the U.S. Food and Drug Administration. The FDA has determined that such clearance or approval is not necessary. This test is used for clinical purposes. It should  not be regarded as investigational or for research. This laboratory is certified under the Clinical Laboratory Improvement Amendments of 1988 (CLIA-88) as qualified to perform high complexity clinical laboratory testing. Testing performed: [facility] [address], [place] , [place] [address]          Addendum #1 performed by Pepper Dutton, MD.   Electronically signed 02/09/2024 Technical component performed at [facility], [address] [place], [place] [address].  Professional component performed at [employer]. [facility], [address] [place], [place] [address].  Immunohistochemistry Technical component (if applicable) was performed at [facility]. [address],  [place], [place] [address].   IMMUNOHISTOCHEMISTRY DISCLAIMER (if applicable): Some of these immunohistochemical stains may have been developed and the performance characteristics determine by [facility]. Some may not have been cleared or approved by the U.S. Food and Drug Administration. The FDA has determined that such clearance or approval is not necessary. This test is used for clinical purposes. It should not be regarded as investigational or for research. This laboratory is certified under the Clinical  Laboratory Improvement Amendments of 1988 (CLIA-88) as qualified to perform high complexity clinical laboratory testing.  The controls stained appropriately.   IHC stains are performed on formalin fixed, paraffin embedded tissue using a 3,3diaminobenzidine (DAB) chromogen and Leica Bond Autostainer System. The staining intensity of the nucleus is score manually and is reported as the percentage of tumor cell nuclei demonstrating specific nuclear staining. T he specimens are fixed in 10% Neutral Formalin for at least 6 hours and up to 72hrs. These tests are validated on decalcified tissue. Results should be interpreted  with caution given the possibility of false negative results on decalcified specimens. Antibody Clones are as follows ER-clone 44F, PR-clone 16, Ki67- clone MM1. Some of these immunohistochemical stains may have been developed and the performance characteristics determined by [facility].   CBC   Collection Time: 02/05/24 10:57 AM  Result Value Ref Range   WBC 7.7 4.0 - 10.5 K/uL   RBC 3.74 (L) 3.87 - 5.11 MIL/uL   Hemoglobin 10.9 (L) 12.0 - 15.0 g/dL   HCT 65.6 (L) 63.9 - 53.9 %   MCV 91.7 80.0 - 100.0 fL   MCH 29.1 26.0 - 34.0 pg   MCHC 31.8 30.0 - 36.0 g/dL   RDW 82.9 (H) 88.4 - 84.4 %   Platelets 372 150 - 400 K/uL   nRBC 0.0 0.0 - 0.2 %  Basic metabolic panel with GFR   Collection Time: 02/05/24 10:57 AM  Result Value Ref Range   Sodium 140 135 - 145 mmol/L   Potassium 3.5 3.5 - 5.1 mmol/L   Chloride 106 98 - 111 mmol/L   CO2 22 22 - 32 mmol/L   Glucose, Bld 186 (H) 70 - 99 mg/dL   BUN 10 8 - 23 mg/dL   Creatinine, Ser 9.06 0.44 - 1.00 mg/dL   Calcium  8.9 8.9 - 10.3 mg/dL   GFR, Estimated >39 >39 mL/min   Anion gap 12 5 - 15  CBC   Collection Time: 02/06/24  7:27 AM  Result Value Ref Range   WBC 12.5 (H) 4.0 - 10.5 K/uL   RBC 3.34 (L) 3.87 - 5.11 MIL/uL   Hemoglobin 9.4 (L) 12.0 - 15.0 g/dL   HCT 69.1 (L) 63.9 - 53.9 %   MCV 92.2 80.0 - 100.0 fL   MCH 28.1 26.0 - 34.0 pg   MCHC 30.5 30.0 - 36.0 g/dL   RDW 83.2 (H) 88.4 - 84.4 %   Platelets 366 150 - 400 K/uL   nRBC 0.0 0.0 - 0.2 %  Basic metabolic panel   Collection Time: 02/06/24  7:27 AM  Result Value Ref Range   Sodium 138 135 - 145 mmol/L   Potassium 3.8 3.5 - 5.1 mmol/L   Chloride 107 98 - 111 mmol/L   CO2 23 22 - 32 mmol/L   Glucose, Bld 120 (H) 70 - 99 mg/dL   BUN 12 8 - 23 mg/dL   Creatinine, Ser 9.27 0.44 - 1.00 mg/dL   Calcium  8.5 (L) 8.9 - 10.3 mg/dL   GFR, Estimated >39 >39 mL/min   Anion gap 9 5 - 15  CBC   Collection Time: 02/07/24  6:40 AM  Result Value Ref Range   WBC 7.0 4.0 -  10.5 K/uL   RBC 3.07 (L) 3.87 - 5.11 MIL/uL   Hemoglobin 8.9 (L) 12.0 - 15.0 g/dL   HCT 70.8 (L) 63.9 - 53.9 %   MCV 94.8 80.0 - 100.0 fL   MCH 29.0 26.0 -  34.0 pg   MCHC 30.6 30.0 - 36.0 g/dL   RDW 83.0 (H) 88.4 - 84.4 %   Platelets 346 150 - 400 K/uL   nRBC 0.0 0.0 - 0.2 %  Basic metabolic panel   Collection Time: 02/07/24  6:40 AM  Result Value Ref Range   Sodium 139 135 - 145 mmol/L   Potassium 3.9 3.5 - 5.1 mmol/L   Chloride 106 98 - 111 mmol/L   CO2 26 22 - 32 mmol/L   Glucose, Bld 91 70 - 99 mg/dL   BUN 13 8 - 23 mg/dL   Creatinine, Ser 9.33 0.44 - 1.00 mg/dL   Calcium  8.6 (L) 8.9 - 10.3 mg/dL   GFR, Estimated >39 >39 mL/min   Anion gap 8 5 - 15  .  Assessment & Plan:   Assessment & Plan    Body mass index is 22.48 kg/m.  Assessment and Plan      No orders of the defined types were placed in this encounter.   No orders of the defined types were placed in this encounter.      Follow-up: No follow-ups on file.  An After Visit Summary was printed and given to the patient.  Keltie Labell, MD [facility] [phone]       [1] Current Outpatient Medications on File Prior to Visit  Medication Sig Dispense Refill   acetaminophen  (TYLENOL ) 500 MG tablet Take 2 tablets (1,000 mg total) by mouth every 8 (eight) hours as needed for mild pain (pain score 1-3).     amLODipine  (NORVASC ) 5 MG tablet Take 5 mg by mouth daily.     Ascorbic Acid (VITAMIN C) 1000 MG tablet Take 1,000 mg by mouth daily.      cetirizine (ZYRTEC) 10 MG tablet Take 10 mg by mouth 2 (two) times daily as needed for allergies.     CRANBERRY PO Take 8,400 mg by mouth daily. With vitamin C     Cyanocobalamin  (VITAMIN B-12 PO) Take 2,500 mcg by mouth daily. Dissolvable tablet     fluticasone (FLONASE) 50 MCG/ACT nasal spray Place 1 spray into both nostrils daily as needed for allergies or rhinitis.     Garlic (GARLIQUE PO) Take 1 tablet by mouth daily. For  cholesterol     metoprolol  succinate (TOPROL -XL) 50 MG 24 hr tablet Take 1 tablet (50 mg total) by mouth in the morning and at bedtime. Take with or immediately following a meal. 180 tablet 3   Multiple Vitamins-Minerals (BONEUP PO) Take 2 capsules by mouth 2 (two) times daily.     Multiple Vitamins-Minerals (OCUVITE PO) Take 1 tablet by mouth daily.      Omega-3 1000 MG CAPS Take 2,000 mg by mouth 2 (two) times daily.     omeprazole (PRILOSEC) 20 MG capsule Take 20 mg by mouth daily.     Probiotic CAPS Take 1 capsule by mouth daily.      zinc  gluconate 50 MG tablet Take 50 mg by mouth daily.      No current facility-administered medications on file prior to visit.

## 2024-02-16 NOTE — Assessment & Plan Note (Addendum)
 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 Hemoglobin was 8.9 g/dL on December 7th, indicating persistent anemia. Received two pints of blood and iron  infusion in the hospital. Awaiting follow-up lab work to assess current hemoglobin levels. - Await follow-up lab work on December 22nd to assess hemoglobin levels.

## 2024-02-16 NOTE — Assessment & Plan Note (Addendum)
 Colorectal adenocarcinoma, post-resection Post-resection status following partial robotic-assisted laparoscopic colectomy for invasive colorectal adenocarcinoma on 02/05/24, discharged on 02/07/24. . Pathology confirmed cancer with clear margins and involvement of one lymph node. She is currently asymptomatic and believes no further treatment is necessary. Awaiting further recommendations from the surgeon and oncologist. - Follow up with surgeon and oncologist on December 22nd for further recommendations.

## 2024-02-17 ENCOUNTER — Other Ambulatory Visit: Payer: Self-pay

## 2024-02-17 NOTE — Progress Notes (Signed)
 The proposed treatment discussed in conference is for discussion purpose only and is not a binding recommendation.  The patients have not been physically examined, or presented with their treatment options.  Therefore, final treatment plans cannot be decided.

## 2024-02-21 NOTE — Progress Notes (Signed)
 " [facility] at [facility] [address] [place],  [place]  [address] [phone]  Clinic Day: 02/22/2024  Referring physician: Teressa Harrie HERO, FNP   HISTORY OF PRESENT ILLNESS:  The patient is an 78 y.o. female with newly diagnosed colon cancer.  She comes in today to go over her surgical pathology from her right hemicolectomy in November 2025 and its implications.  Since her last visit, the patient has been doing fairly well.  She denies having any overt forms of blood loss, abdominal pain, or other symptoms which concern her for disease recurrence.    PHYSICAL EXAM:  Blood pressure 128/66, pulse 91, temperature (!) 97.3 F (36.3 C), temperature source Oral, resp. rate 14, height 5' 1 (1.549 m), weight 117 lb (53.1 kg), SpO2 100%. Wt Readings from Last 3 Encounters:  02/22/24 117 lb (53.1 kg)  02/15/24 119 lb (54 kg)  02/10/24 119 lb (54 kg)   Body mass index is 22.11 kg/m. Performance status (ECOG): 1 - Symptomatic but completely ambulatory Physical Exam Constitutional:      Appearance: Normal appearance. She is not ill-appearing.  HENT:     Mouth/Throat:     Mouth: Mucous membranes are moist.     Pharynx: Oropharynx is clear. No oropharyngeal exudate or posterior oropharyngeal erythema.  Cardiovascular:     Rate and Rhythm: Normal rate and regular rhythm.     Heart sounds: No murmur heard.    No friction rub. No gallop.  Pulmonary:     Effort: Pulmonary effort is normal. No respiratory distress.     Breath sounds: Normal breath sounds. No wheezing, rhonchi or rales.  Abdominal:     General: Bowel sounds are normal. There is no distension.     Palpations: Abdomen is soft. There is no mass.     Tenderness: There is no abdominal tenderness.  Musculoskeletal:        General: No swelling.     Right lower leg: No edema.     Left lower leg: No edema.  Lymphadenopathy:     Cervical: No cervical adenopathy.     Upper Body:     Right upper body: No  supraclavicular or axillary adenopathy.     Left upper body: No supraclavicular or axillary adenopathy.     Lower Body: No right inguinal adenopathy. No left inguinal adenopathy.  Skin:    General: Skin is warm.     Coloration: Skin is not jaundiced.     Findings: No lesion or rash.  Neurological:     General: No focal deficit present.     Mental Status: She is alert and oriented to person, place, and time. Mental status is at baseline.  Psychiatric:        Mood and Affect: Mood normal.        Behavior: Behavior normal.        Thought Content: Thought content normal.     LABS:      Latest Ref Rng & Units 02/22/2024    1:14 PM 02/07/2024    6:40 AM 02/06/2024    7:27 AM  CBC  WBC 4.0 - 10.5 K/uL 5.4  7.0  12.5   Hemoglobin 12.0 - 15.0 g/dL 9.0  8.9  9.4   Hematocrit 36.0 - 46.0 % 29.0  29.1  30.8   Platelets 150 - 400 K/uL 397  346  366       Latest Ref Rng & Units 02/22/2024    1:14 PM 02/07/2024  6:40 AM 02/06/2024    7:27 AM  CMP  Glucose 70 - 99 mg/dL 94  91  879   BUN 8 - 23 mg/dL 19  13  12    Creatinine 0.44 - 1.00 mg/dL 9.05  9.33  9.27   Sodium 135 - 145 mmol/L 142  139  138   Potassium 3.5 - 5.1 mmol/L 3.7  3.9  3.8   Chloride 98 - 111 mmol/L 107  106  107   CO2 22 - 32 mmol/L 25  26  23    Calcium  8.9 - 10.3 mg/dL 9.6  8.6  8.5   Total Protein 6.5 - 8.1 g/dL 6.5     Total Bilirubin 0.0 - 1.2 mg/dL <9.7     Alkaline Phos 38 - 126 U/L 111     AST 15 - 41 U/L 20     ALT 0 - 44 U/L 6       Latest Reference Range & Units 02/22/24 13:14  Iron  28 - 170 ug/dL 31  UIBC ug/dL 774  TIBC 749 - 549 ug/dL 743  Saturation Ratios 10.4 - 31.8 % 12  Ferritin 11 - 307 ng/mL 157  Folate >5.9 ng/mL 7.3  Vitamin B12 180 - 914 pg/mL 2,720 (H)  (H): Data is abnormally high  PATHOLOGY:  FINAL MICROSCOPIC DIAGNOSIS:  A. COLON, RIGHT, COLECTOMY: - Invasive colorectal adenocarcinoma with mucinous features. - Metastatic adenocarcinoma involves one of twenty-two lymph  nodes (1/22). - See cancer summary below. - Incidental tubular adenoma measuring 0.5 cm. - Appendix with fibrous obliteration of the distal lumen. - Tattoo pigment.  CASE SUMMARY: COLON AND RECTUM RESECTION Standard(s): AJCC 8  SPECIMEN Procedure: Right hemicolectomy  TUMOR Tumor Site: Ascending colon Histologic Type: Adenocarcinoma with mucinous features Histologic Grade: G2, moderately differentiated Tumor Size: Greatest dimension: 9.2 cm Tumor Extent: Invades through muscularis propria into the pericolic tissue Macroscopic Tumor Perforation: Not identified Lymphatic and/or Vascular Invasion: Present, small vessel Perineural Invasion: Not identified Tumor Budding Score: Low (0-4) Treatment Effect: No known presurgical therapy  MARGINS Margin Status for Invasive Carcinoma: All margins negative for invasive carcinoma Margin Status for Non-Invasive Tumor: All margins negative for high-grade dysplasia/intramucosal carcinoma and low-grade dysplasia  REGIONAL LYMPH NODES Regional Lymph Node Status: Tumor present in regional lymph node(s)           Number of Lymph Nodes with Tumor: 1           Number of Lymph Nodes Examined: 22  Tumor Deposits: Not identified  DISTANT METASTASES Distant Site(s) Involved, if applicable: Not applicable  PATHOLOGIC STAGE CLASSIFICATION (pTNM, AJCC 8th Edition): Modified Classification: Not applicable pT3 pN1a pM - Not applicable  ADDITIONAL FINDINGS - incidental tubular adenoma  SPECIAL STUDIES Results of immunohistochemistry for MMR (mismatch repair proteins) will be resulted in an addendum. (v4.4.0.1)  ASSESSMENT & PLAN:  An 78 y.o. 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  In clinic today, I went over all of her surgical pathology with her.  The patient understands that she has node positive disease, adjuvant chemotherapy is considered standard of care.  I will talk to  her today about treatment options, such as capecitabine/oxaliplatin, versus FOLFOX versus just single-agent Xeloda in the adjuvant setting.  Despite hearing this, the patient is not particularly interested in undergoing adjuvant chemotherapy at this point in her life.  Although I do believe she is healthy enough to get through 3 months of adjuvant  chemotherapy, I will acquiesce to her wishes.  She wishes to talk to her husband and other people she confides in with respect to her decision today.  I will tentatively see her back in 2 months for repeat clinical assessment.  If she sticks with her decision not to undergo adjuvant chemotherapy, I do believe it would be worth her undergoing Signatera testing to see if there are any signs of early disease recurrence, even down to the molecular level.  The patient understands everything discussed today and knows to contact our office before her next visit if she has second thoughts about reconsidering adjuvant chemotherapy for her stage III disease.  Chanze Teagle DELENA Kerns, MD       "

## 2024-02-22 ENCOUNTER — Telehealth: Payer: Self-pay | Admitting: Oncology

## 2024-02-22 ENCOUNTER — Inpatient Hospital Stay

## 2024-02-22 ENCOUNTER — Other Ambulatory Visit: Payer: Self-pay | Admitting: Oncology

## 2024-02-22 ENCOUNTER — Inpatient Hospital Stay: Attending: Hematology and Oncology | Admitting: Oncology

## 2024-02-22 VITALS — BP 128/66 | HR 91 | Temp 97.3°F | Resp 14 | Ht 61.0 in | Wt 117.0 lb

## 2024-02-22 DIAGNOSIS — C182 Malignant neoplasm of ascending colon: Secondary | ICD-10-CM

## 2024-02-22 DIAGNOSIS — D649 Anemia, unspecified: Secondary | ICD-10-CM

## 2024-02-22 LAB — CBC WITH DIFFERENTIAL (CANCER CENTER ONLY)
Abs Immature Granulocytes: 0.01 K/uL (ref 0.00–0.07)
Basophils Absolute: 0.1 K/uL (ref 0.0–0.1)
Basophils Relative: 1 %
Eosinophils Absolute: 0.2 K/uL (ref 0.0–0.5)
Eosinophils Relative: 4 %
HCT: 29 % — ABNORMAL LOW (ref 36.0–46.0)
Hemoglobin: 9 g/dL — ABNORMAL LOW (ref 12.0–15.0)
Immature Granulocytes: 0 %
Lymphocytes Relative: 21 %
Lymphs Abs: 1.2 K/uL (ref 0.7–4.0)
MCH: 29.4 pg (ref 26.0–34.0)
MCHC: 31 g/dL (ref 30.0–36.0)
MCV: 94.8 fL (ref 80.0–100.0)
Monocytes Absolute: 0.5 K/uL (ref 0.1–1.0)
Monocytes Relative: 10 %
Neutro Abs: 3.4 K/uL (ref 1.7–7.7)
Neutrophils Relative %: 64 %
Platelet Count: 397 K/uL (ref 150–400)
RBC: 3.06 MIL/uL — ABNORMAL LOW (ref 3.87–5.11)
RDW: 18.3 % — ABNORMAL HIGH (ref 11.5–15.5)
WBC Count: 5.4 K/uL (ref 4.0–10.5)
nRBC: 0 % (ref 0.0–0.2)

## 2024-02-22 LAB — FOLATE: Folate: 7.3 ng/mL

## 2024-02-22 LAB — CMP (CANCER CENTER ONLY)
ALT: 6 U/L (ref 0–44)
AST: 20 U/L (ref 15–41)
Albumin: 3.5 g/dL (ref 3.5–5.0)
Alkaline Phosphatase: 111 U/L (ref 38–126)
Anion gap: 10 (ref 5–15)
BUN: 19 mg/dL (ref 8–23)
CO2: 25 mmol/L (ref 22–32)
Calcium: 9.6 mg/dL (ref 8.9–10.3)
Chloride: 107 mmol/L (ref 98–111)
Creatinine: 0.94 mg/dL (ref 0.44–1.00)
GFR, Estimated: >60 mL/min
Glucose, Bld: 94 mg/dL (ref 70–99)
Potassium: 3.7 mmol/L (ref 3.5–5.1)
Sodium: 142 mmol/L (ref 135–145)
Total Bilirubin: <0.2 mg/dL (ref 0.0–1.2)
Total Protein: 6.5 g/dL (ref 6.5–8.1)

## 2024-02-22 LAB — IRON AND TIBC
Iron: 31 ug/dL (ref 28–170)
Saturation Ratios: 12 % (ref 10.4–31.8)
TIBC: 256 ug/dL (ref 250–450)
UIBC: 225 ug/dL

## 2024-02-22 LAB — VITAMIN B12: Vitamin B-12: 2720 pg/mL — ABNORMAL HIGH (ref 180–914)

## 2024-02-22 LAB — CEA (ACCESS): CEA (CHCC): 1.21 ng/mL (ref 0.00–5.00)

## 2024-02-22 LAB — FERRITIN: Ferritin: 157 ng/mL (ref 11–307)

## 2024-02-22 NOTE — Telephone Encounter (Signed)
 Patient has been scheduled for follow-up visit per 02/22/2024 LOS.  Pt given an appt calendar with date and time.

## 2024-02-23 ENCOUNTER — Telehealth: Payer: Self-pay

## 2024-02-23 NOTE — Telephone Encounter (Addendum)
"   Latest Reference Range & Units 02/22/24 13:14  Iron  28 - 170 ug/dL 31  UIBC ug/dL 774  TIBC 749 - 549 ug/dL 743  Saturation Ratios 10.4 - 31.8 % 12  Ferritin 11 - 307 ng/mL 157  Folate >5.9 ng/mL 7.3  Vitamin B12 180 - 914 pg/mL 2,720 (H)  (H): Data is abnormally high    Dr Karena though these numbers are not overwhelming for iron  def, I'm willing to give her IV iron  to see if her hgb improves over time. "

## 2024-02-28 ENCOUNTER — Encounter: Payer: Self-pay | Admitting: Hematology and Oncology

## 2024-02-28 ENCOUNTER — Other Ambulatory Visit: Payer: Self-pay | Admitting: Oncology

## 2024-03-07 ENCOUNTER — Telehealth: Payer: Self-pay

## 2024-03-07 NOTE — Telephone Encounter (Signed)
 Pt called and states she saw the surgeon this morning. She is ready to get the chemo started. Message sent to Dr Ezzard, Andrez Berkeley RIGGERS, Melissa P,NP, Devere SHAUNNA LIED, Kaitlyn Schomburg,RPH, Rae,RN, and Donna G, authorizations.

## 2024-03-09 ENCOUNTER — Telehealth: Payer: Self-pay | Admitting: Oncology

## 2024-03-09 LAB — HM MAMMOGRAPHY

## 2024-03-09 NOTE — Telephone Encounter (Signed)
-----   Message from CMA Tammy B sent at 03/08/2024  5:16 PM EST ----- Regarding: F/U APPT PLEASE CALL AND SCHED F/U WITH LEWIS TO DISCUSS TX.  NO LAB APPT NEEDED.  THANKS

## 2024-03-09 NOTE — Telephone Encounter (Signed)
 Contacted pt to schedule an appt. Unable to reach via phone, voicemail was left.

## 2024-03-09 NOTE — Telephone Encounter (Signed)
 Dr Ezzard wrote Monday, 03/07/2024 @ 525pm- she was pretty resistant to our chemo talk at her last visit. I'm shocked by this. Will let the schedulers know and try to get her set up soon for it. Damaris, scheduler: 03/09/2024 @ 0920 -  no answer. Kaitlyn Schomburg,RPH: 03/09/2024 @ 1015 - Denise or Damaris have you been able to get a hold of patient to schedule appt with Dr. Ezzard?  Karna Raspberry, scheduler: 03/09/2024 @ 1018- Contacted pt to schedule an appt. Unable to reach via phone, voicemail was left.

## 2024-03-09 NOTE — Telephone Encounter (Signed)
 Patient has been scheduled. Aware of appt date and time.

## 2024-03-14 NOTE — Progress Notes (Unsigned)
 " [facility] at [facility] [address] [place],  [place]  [address] [phone]  Clinic Day: 02/22/2024  Referring physician: Teressa Harrie HERO, FNP   HISTORY OF PRESENT ILLNESS:  The patient is an 79 y.o. 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     newly diagnosed colon cancer.  She comes in today to go over her surgical pathology from her right hemicolectomy in November 2025 and its implications.  Since her last visit, the patient has been doing fairly well.  She denies having any overt forms of blood loss, abdominal pain, or other symptoms which concern her for disease recurrence.    PHYSICAL EXAM:  There were no vitals taken for this visit. Wt Readings from Last 3 Encounters:  02/22/24 117 lb (53.1 kg)  02/15/24 119 lb (54 kg)  02/10/24 119 lb (54 kg)   There is no height or weight on file to calculate BMI. Performance status (ECOG): 1 - Symptomatic but completely ambulatory Physical Exam Constitutional:      Appearance: Normal appearance. She is not ill-appearing.  HENT:     Mouth/Throat:     Mouth: Mucous membranes are moist.     Pharynx: Oropharynx is clear. No oropharyngeal exudate or posterior oropharyngeal erythema.  Cardiovascular:     Rate and Rhythm: Normal rate and regular rhythm.     Heart sounds: No murmur heard.    No friction rub. No gallop.  Pulmonary:     Effort: Pulmonary effort is normal. No respiratory distress.     Breath sounds: Normal breath sounds. No wheezing, rhonchi or rales.  Abdominal:     General: Bowel sounds are normal. There is no distension.     Palpations: Abdomen is soft. There is no mass.     Tenderness: There is no abdominal tenderness.  Musculoskeletal:        General: No swelling.     Right lower leg: No edema.     Left lower leg: No edema.  Lymphadenopathy:     Cervical: No cervical adenopathy.     Upper Body:     Right upper body: No  supraclavicular or axillary adenopathy.     Left upper body: No supraclavicular or axillary adenopathy.     Lower Body: No right inguinal adenopathy. No left inguinal adenopathy.  Skin:    General: Skin is warm.     Coloration: Skin is not jaundiced.     Findings: No lesion or rash.  Neurological:     General: No focal deficit present.     Mental Status: She is alert and oriented to person, place, and time. Mental status is at baseline.  Psychiatric:        Mood and Affect: Mood normal.        Behavior: Behavior normal.        Thought Content: Thought content normal.     LABS:      Latest Ref Rng & Units 02/22/2024    1:14 PM 02/07/2024    6:40 AM 02/06/2024    7:27 AM  CBC  WBC 4.0 - 10.5 K/uL 5.4  7.0  12.5   Hemoglobin 12.0 - 15.0 g/dL 9.0  8.9  9.4   Hematocrit 36.0 - 46.0 % 29.0  29.1  30.8   Platelets 150 - 400 K/uL 397  346  366       Latest Ref Rng & Units 02/22/2024  1:14 PM 02/07/2024    6:40 AM 02/06/2024    7:27 AM  CMP  Glucose 70 - 99 mg/dL 94  91  879   BUN 8 - 23 mg/dL 19  13  12    Creatinine 0.44 - 1.00 mg/dL 9.05  9.33  9.27   Sodium 135 - 145 mmol/L 142  139  138   Potassium 3.5 - 5.1 mmol/L 3.7  3.9  3.8   Chloride 98 - 111 mmol/L 107  106  107   CO2 22 - 32 mmol/L 25  26  23    Calcium  8.9 - 10.3 mg/dL 9.6  8.6  8.5   Total Protein 6.5 - 8.1 g/dL 6.5     Total Bilirubin 0.0 - 1.2 mg/dL <9.7     Alkaline Phos 38 - 126 U/L 111     AST 15 - 41 U/L 20     ALT 0 - 44 U/L 6       Latest Reference Range & Units 02/22/24 13:14  Iron  28 - 170 ug/dL 31  UIBC ug/dL 774  TIBC 749 - 549 ug/dL 743  Saturation Ratios 10.4 - 31.8 % 12  Ferritin 11 - 307 ng/mL 157  Folate >5.9 ng/mL 7.3  Vitamin B12 180 - 914 pg/mL 2,720 (H)  (H): Data is abnormally high  PATHOLOGY:  FINAL MICROSCOPIC DIAGNOSIS:  A. COLON, RIGHT, COLECTOMY: - Invasive colorectal adenocarcinoma with mucinous features. - Metastatic adenocarcinoma involves one of twenty-two lymph  nodes (1/22). - See cancer summary below. - Incidental tubular adenoma measuring 0.5 cm. - Appendix with fibrous obliteration of the distal lumen. - Tattoo pigment.  CASE SUMMARY: COLON AND RECTUM RESECTION Standard(s): AJCC 8  SPECIMEN Procedure: Right hemicolectomy  TUMOR Tumor Site: Ascending colon Histologic Type: Adenocarcinoma with mucinous features Histologic Grade: G2, moderately differentiated Tumor Size: Greatest dimension: 9.2 cm Tumor Extent: Invades through muscularis propria into the pericolic tissue Macroscopic Tumor Perforation: Not identified Lymphatic and/or Vascular Invasion: Present, small vessel Perineural Invasion: Not identified Tumor Budding Score: Low (0-4) Treatment Effect: No known presurgical therapy  MARGINS Margin Status for Invasive Carcinoma: All margins negative for invasive carcinoma Margin Status for Non-Invasive Tumor: All margins negative for high-grade dysplasia/intramucosal carcinoma and low-grade dysplasia  REGIONAL LYMPH NODES Regional Lymph Node Status: Tumor present in regional lymph node(s)           Number of Lymph Nodes with Tumor: 1           Number of Lymph Nodes Examined: 22  Tumor Deposits: Not identified  DISTANT METASTASES Distant Site(s) Involved, if applicable: Not applicable  PATHOLOGIC STAGE CLASSIFICATION (pTNM, AJCC 8th Edition): Modified Classification: Not applicable pT3 pN1a pM - Not applicable  ADDITIONAL FINDINGS - incidental tubular adenoma  SPECIAL STUDIES Results of immunohistochemistry for MMR (mismatch repair proteins) will be resulted in an addendum. (v4.4.0.1)  ASSESSMENT & PLAN:  An 79 y.o. 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  In clinic today, I went over all of her surgical pathology with her.  The patient understands that she has node positive disease, adjuvant chemotherapy is considered standard of care.  I will talk to  her today about treatment options, such as capecitabine /oxaliplatin, versus FOLFOX versus just single-agent Xeloda  in the adjuvant setting.  Despite hearing this, the patient is not particularly interested in undergoing adjuvant chemotherapy at this point in her life.  Although I do believe she is healthy enough to  get through 3 months of adjuvant chemotherapy, I will acquiesce to her wishes.  She wishes to talk to her husband and other people she confides in with respect to her decision today.  I will tentatively see her back in 2 months for repeat clinical assessment.  If she sticks with her decision not to undergo adjuvant chemotherapy, I do believe it would be worth her undergoing Signatera testing to see if there are any signs of early disease recurrence, even down to the molecular level.  The patient understands everything discussed today and knows to contact our office before her next visit if she has second thoughts about reconsidering adjuvant chemotherapy for her stage III disease.  Karess Harner DELENA Kerns, MD       "

## 2024-03-15 ENCOUNTER — Inpatient Hospital Stay: Attending: Hematology and Oncology | Admitting: Oncology

## 2024-03-15 ENCOUNTER — Other Ambulatory Visit (HOSPITAL_COMMUNITY): Payer: Self-pay

## 2024-03-15 ENCOUNTER — Inpatient Hospital Stay

## 2024-03-15 ENCOUNTER — Other Ambulatory Visit: Payer: Self-pay

## 2024-03-15 ENCOUNTER — Other Ambulatory Visit: Payer: Self-pay | Admitting: Oncology

## 2024-03-15 ENCOUNTER — Encounter: Payer: Self-pay | Admitting: Oncology

## 2024-03-15 ENCOUNTER — Ambulatory Visit: Payer: Self-pay | Admitting: Family Medicine

## 2024-03-15 ENCOUNTER — Inpatient Hospital Stay: Admitting: Hematology and Oncology

## 2024-03-15 VITALS — BP 133/71 | HR 88 | Temp 98.0°F | Resp 14 | Ht 61.0 in | Wt 118.0 lb

## 2024-03-15 DIAGNOSIS — Z9049 Acquired absence of other specified parts of digestive tract: Secondary | ICD-10-CM | POA: Diagnosis not present

## 2024-03-15 DIAGNOSIS — C182 Malignant neoplasm of ascending colon: Secondary | ICD-10-CM

## 2024-03-15 DIAGNOSIS — C189 Malignant neoplasm of colon, unspecified: Secondary | ICD-10-CM

## 2024-03-15 LAB — CMP (CANCER CENTER ONLY)
ALT: 6 U/L (ref 0–44)
AST: 23 U/L (ref 15–41)
Albumin: 4.2 g/dL (ref 3.5–5.0)
Alkaline Phosphatase: 81 U/L (ref 38–126)
Anion gap: 10 (ref 5–15)
BUN: 19 mg/dL (ref 8–23)
CO2: 26 mmol/L (ref 22–32)
Calcium: 10.1 mg/dL (ref 8.9–10.3)
Chloride: 105 mmol/L (ref 98–111)
Creatinine: 0.83 mg/dL (ref 0.44–1.00)
GFR, Estimated: >60 mL/min
Glucose, Bld: 97 mg/dL (ref 70–99)
Potassium: 3.6 mmol/L (ref 3.5–5.1)
Sodium: 141 mmol/L (ref 135–145)
Total Bilirubin: 0.2 mg/dL (ref 0.0–1.2)
Total Protein: 7 g/dL (ref 6.5–8.1)

## 2024-03-15 LAB — CBC WITH DIFFERENTIAL (CANCER CENTER ONLY)
Abs Immature Granulocytes: 0.01 K/uL (ref 0.00–0.07)
Basophils Absolute: 0 K/uL (ref 0.0–0.1)
Basophils Relative: 1 %
Eosinophils Absolute: 0.2 K/uL (ref 0.0–0.5)
Eosinophils Relative: 5 %
HCT: 34.9 % — ABNORMAL LOW (ref 36.0–46.0)
Hemoglobin: 11.2 g/dL — ABNORMAL LOW (ref 12.0–15.0)
Immature Granulocytes: 0 %
Lymphocytes Relative: 24 %
Lymphs Abs: 1.1 K/uL (ref 0.7–4.0)
MCH: 29.7 pg (ref 26.0–34.0)
MCHC: 32.1 g/dL (ref 30.0–36.0)
MCV: 92.6 fL (ref 80.0–100.0)
Monocytes Absolute: 0.6 K/uL (ref 0.1–1.0)
Monocytes Relative: 12 %
Neutro Abs: 2.7 K/uL (ref 1.7–7.7)
Neutrophils Relative %: 58 %
Platelet Count: 242 K/uL (ref 150–400)
RBC: 3.77 MIL/uL — ABNORMAL LOW (ref 3.87–5.11)
RDW: 17.3 % — ABNORMAL HIGH (ref 11.5–15.5)
WBC Count: 4.7 K/uL (ref 4.0–10.5)
nRBC: 0 % (ref 0.0–0.2)

## 2024-03-15 LAB — IRON AND TIBC
Iron: 59 ug/dL (ref 28–170)
Saturation Ratios: 18 % (ref 10.4–31.8)
TIBC: 333 ug/dL (ref 250–450)
UIBC: 274 ug/dL

## 2024-03-15 LAB — CEA (ACCESS): CEA (CHCC): 1.05 ng/mL (ref 0.00–5.00)

## 2024-03-15 LAB — FERRITIN: Ferritin: 50 ng/mL (ref 11–307)

## 2024-03-15 MED ORDER — CAPECITABINE 150 MG PO TABS: 150.0000 mg | ORAL_TABLET | Freq: Two times a day (BID) | ORAL | 3 refills | Status: DC

## 2024-03-15 MED ORDER — CAPECITABINE 500 MG PO TABS: 1000.0000 mg | ORAL_TABLET | Freq: Two times a day (BID) | ORAL | 3 refills | Status: DC

## 2024-03-15 NOTE — Progress Notes (Deleted)
 " [facility] [address] [place],  [place]  [address] [phone]  Clinic Day: 02/22/2024  Referring physician: Teressa Harrie HERO, FNP   HISTORY OF PRESENT ILLNESS:  The patient is an 79 y.o. 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  Since her last visit, the patient has been doing fairly well.  She denies having any overt forms of blood loss, abdominal pain, or other symptoms which concern her for disease recurrence.  At her last visit, she opted to forgo treatment at this time.   PHYSICAL EXAM:  There were no vitals taken for this visit. Wt Readings from Last 3 Encounters:  03/15/24 118 lb (53.5 kg)  02/22/24 117 lb (53.1 kg)  02/15/24 119 lb (54 kg)   There is no height or weight on file to calculate BMI. Performance status (ECOG): 1 - Symptomatic but completely ambulatory Physical Exam Constitutional:      Appearance: Normal appearance. She is not ill-appearing.  HENT:     Mouth/Throat:     Mouth: Mucous membranes are moist.     Pharynx: Oropharynx is clear. No oropharyngeal exudate or posterior oropharyngeal erythema.  Cardiovascular:     Rate and Rhythm: Normal rate and regular rhythm.     Heart sounds: No murmur heard.    No friction rub. No gallop.  Pulmonary:     Effort: Pulmonary effort is normal. No respiratory distress.     Breath sounds: Normal breath sounds. No wheezing, rhonchi or rales.  Abdominal:     General: Bowel sounds are normal. There is no distension.     Palpations: Abdomen is soft. There is no mass.     Tenderness: There is no abdominal tenderness.  Musculoskeletal:        General: No swelling.     Right lower leg: No edema.     Left lower leg: No edema.  Lymphadenopathy:     Cervical: No cervical adenopathy.     Upper Body:     Right upper body: No supraclavicular or axillary adenopathy.     Left upper body: No supraclavicular or axillary  adenopathy.     Lower Body: No right inguinal adenopathy. No left inguinal adenopathy.  Skin:    General: Skin is warm.     Coloration: Skin is not jaundiced.     Findings: No lesion or rash.  Neurological:     General: No focal deficit present.     Mental Status: She is alert and oriented to person, place, and time. Mental status is at baseline.  Psychiatric:        Mood and Affect: Mood normal.        Behavior: Behavior normal.        Thought Content: Thought content normal.     LABS:      Latest Ref Rng & Units 02/22/2024    1:14 PM 02/07/2024    6:40 AM 02/06/2024    7:27 AM  CBC  WBC 4.0 - 10.5 K/uL 5.4  7.0  12.5   Hemoglobin 12.0 - 15.0 g/dL 9.0  8.9  9.4   Hematocrit 36.0 - 46.0 % 29.0  29.1  30.8   Platelets 150 - 400 K/uL 397  346  366       Latest Ref Rng & Units 02/22/2024    1:14 PM 02/07/2024    6:40 AM 02/06/2024    7:27 AM  CMP  Glucose 70 - 99 mg/dL 94  91  879   BUN 8 - 23 mg/dL 19  13  12    Creatinine 0.44 - 1.00 mg/dL 9.05  9.33  9.27   Sodium 135 - 145 mmol/L 142  139  138   Potassium 3.5 - 5.1 mmol/L 3.7  3.9  3.8   Chloride 98 - 111 mmol/L 107  106  107   CO2 22 - 32 mmol/L 25  26  23    Calcium  8.9 - 10.3 mg/dL 9.6  8.6  8.5   Total Protein 6.5 - 8.1 g/dL 6.5     Total Bilirubin 0.0 - 1.2 mg/dL <9.7     Alkaline Phos 38 - 126 U/L 111     AST 15 - 41 U/L 20     ALT 0 - 44 U/L 6       Latest Reference Range & Units 02/22/24 13:14  Iron  28 - 170 ug/dL 31  UIBC ug/dL 774  TIBC 749 - 549 ug/dL 743  Saturation Ratios 10.4 - 31.8 % 12  Ferritin 11 - 307 ng/mL 157  Folate >5.9 ng/mL 7.3  Vitamin B12 180 - 914 pg/mL 2,720 (H)  (H): Data is abnormally high  PATHOLOGY:  FINAL MICROSCOPIC DIAGNOSIS:  A. COLON, RIGHT, COLECTOMY: - Invasive colorectal adenocarcinoma with mucinous features. - Metastatic adenocarcinoma involves one of twenty-two lymph nodes (1/22). - See cancer summary below. - Incidental tubular adenoma measuring 0.5 cm. -  Appendix with fibrous obliteration of the distal lumen. - Tattoo pigment.  CASE SUMMARY: COLON AND RECTUM RESECTION Standard(s): AJCC 8  SPECIMEN Procedure: Right hemicolectomy  TUMOR Tumor Site: Ascending colon Histologic Type: Adenocarcinoma with mucinous features Histologic Grade: G2, moderately differentiated Tumor Size: Greatest dimension: 9.2 cm Tumor Extent: Invades through muscularis propria into the pericolic tissue Macroscopic Tumor Perforation: Not identified Lymphatic and/or Vascular Invasion: Present, small vessel Perineural Invasion: Not identified Tumor Budding Score: Low (0-4) Treatment Effect: No known presurgical therapy  MARGINS Margin Status for Invasive Carcinoma: All margins negative for invasive carcinoma Margin Status for Non-Invasive Tumor: All margins negative for high-grade dysplasia/intramucosal carcinoma and low-grade dysplasia  REGIONAL LYMPH NODES Regional Lymph Node Status: Tumor present in regional lymph node(s)           Number of Lymph Nodes with Tumor: 1           Number of Lymph Nodes Examined: 22  Tumor Deposits: Not identified  DISTANT METASTASES Distant Site(s) Involved, if applicable: Not applicable  PATHOLOGIC STAGE CLASSIFICATION (pTNM, AJCC 8th Edition): Modified Classification: Not applicable pT3 pN1a pM - Not applicable  ADDITIONAL FINDINGS - incidental tubular adenoma  SPECIAL STUDIES Results of immunohistochemistry for MMR (mismatch repair proteins) will be resulted in an addendum. (v4.4.0.1)  ASSESSMENT & PLAN:  An 79 y.o. 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  In clinic today, I went over all of her surgical pathology with her.  The patient understands that she has node positive disease, adjuvant chemotherapy is considered standard of care.  I will talk to her today about treatment options, such as capecitabine /oxaliplatin, versus FOLFOX versus just  single-agent Xeloda  in the adjuvant setting.  Despite hearing this, the patient is not particularly interested in undergoing adjuvant chemotherapy at this point in her life.  Although I do believe she is healthy enough to get through 3 months of adjuvant chemotherapy, I will acquiesce to her wishes.  She wishes to  talk to her husband and other people she confides in with respect to her decision today.  I will tentatively see her back in 2 months for repeat clinical assessment.  If she sticks with her decision not to undergo adjuvant chemotherapy, I do believe it would be worth her undergoing Signatera testing to see if there are any signs of early disease recurrence, even down to the molecular level.  The patient understands everything discussed today and knows to contact our office before her next visit if she has second thoughts about reconsidering adjuvant chemotherapy for her stage III disease.  Eleanor DELENA Bach, NP       "

## 2024-03-15 NOTE — Progress Notes (Signed)
 START ON PATHWAY REGIMEN - Colorectal     A cycle is every 21 days:     Capecitabine       Oxaliplatin   **Always confirm dose/schedule in your pharmacy ordering system**  Patient Characteristics: Postoperative without Neoadjuvant Therapy, M0 (Pathologic Staging), Colon, Stage III, MSS/pMMR, Low Risk (pT1-3, pN1) Therapeutic Status: Postoperative without Neoadjuvant Therapy, M0 (Pathologic Staging) Tumor Location: Colon AJCC T Category: pT3 AJCC N Category: pN1a AJCC M Category: cM0 AJCC 8 Stage Grouping: IIIB Microsatellite/Mismatch Repair Status: MSS/pMMR Intent of Therapy: Curative Intent, Discussed with Patient

## 2024-03-16 ENCOUNTER — Encounter: Payer: Self-pay | Admitting: Oncology

## 2024-03-16 ENCOUNTER — Telehealth: Payer: Self-pay

## 2024-03-16 ENCOUNTER — Other Ambulatory Visit: Payer: Self-pay

## 2024-03-16 DIAGNOSIS — C182 Malignant neoplasm of ascending colon: Secondary | ICD-10-CM

## 2024-03-16 NOTE — Telephone Encounter (Addendum)
 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  Labs from 03/15/2024 (CMP, CrCl 45.65 mL/min, CBC, ferritin, and iron  studies) assessed, no interventions needed. Prescription dose and frequency assessed for appropriateness.   Current medication list in Epic reviewed, DDIs with Xeloda  (capecitabine ) identified and listed below. Monitor for changed efficacy of this medication and no dose adjustments required at this time.  - concurrent use of Xeloda  and metronidazole  can increase concentration of Xeloda .  - concurrent use of Xeloda  and proton pump inhibitors (omeprazole) can reduce the efficacy of Xeloda .   Evaluated chart and no patient barriers to medication adherence noted.   Patient agreement for treatment documented in MD note on 03/15/2024.  Prescription has been e-scribed to the [facility] for benefits analysis and approval.  Oral Oncology Clinic will continue to follow for insurance authorization, copayment issues, initial counseling and start date.  Damien Henry, M.S. PharmD Candidate Class of 2026 [employer]  03/16/2024 8:38 AM Oral Oncology Clinic [phone]

## 2024-03-17 ENCOUNTER — Encounter: Payer: Self-pay | Admitting: Oncology

## 2024-03-17 ENCOUNTER — Telehealth: Payer: Self-pay | Admitting: Oncology

## 2024-03-17 NOTE — Progress Notes (Signed)
 "  [facility] CARE CLINIC CONSULT NOTE [facility]  [phone] [phone]  Patient Care Team: Teressa Harrie HERO, FNP as PCP - General (Family Medicine) [name], MD as Consulting Physician (Hematology and Oncology) [facility], [name], Jennifer SAUNDERS, MD as Consulting Physician (Cardiology) Vanderbilt Ned, MD as Consulting Physician (Oncology) Latisha Medford, MD as Consulting Physician (Obstetrics and Gynecology)   Name of the patient: Andrea [name]  [id number]  03/21/1945   Date of visit: 03/17/24  Diagnosis- Adenocarcinoma, colon  Chief complaint/Reason for visit- Initial Meeting for [facility], preparing for starting chemotherapy   Heme/Onc history:  Oncology History  Ductal carcinoma in situ (DCIS) of left breast  12/09/2018 Initial Diagnosis   Routine screening mammogram detected left breast calcifications spanning 1.8cm in the UOQ, and an additional group of calcifications in the UOQ spanning 0.4cm. Biopsy DCIS with calcifications, high grade, ER+ 100%, PR+ 10%.    12/09/2018 Cancer Staging   Staging form: Breast, AJCC 8th Edition - Clinical stage from 12/09/2018: Stage 0 (cTis (DCIS), cN0, cM0, ER+, PR+)    01/04/2019 Surgery   Left lumpectomy ([facility]) ([id number]): high grade DCIS, 2.0cm, involved margins. No regional lymph nodes examined.   01/04/2019 Cancer Staging   Staging form: Breast, AJCC 8th Edition - Pathologic stage from 01/04/2019: Stage 0 (pTis (DCIS), pN0, cM0)     01/18/2019 Surgery   Left lumpectomy re-excision ([facility]) ([id number]): medial and inferior margin negative for in situ or invasive carcinoma   02/18/2019 - 03/18/2019 Radiation Therapy   The patient initially received a dose of 40.05 Gy in 15 fractions to the breast using whole-breast tangent fields. This was delivered using a 3-D conformal technique. The pt received a boost delivering an additional 10 Gy in 5 fractions  using a electron boost with electrons. The total dose was 50.05 Gy.   03/2019 - 03/2024 Anti-estrogen oral therapy   Tamoxifen    Adenocarcinoma, colon (HCC)  01/12/2024 Initial Diagnosis   Colon cancer (HCC)   02/19/2024 Cancer Staging   Staging form: Colon and Rectum, AJCC 8th Edition - Pathologic stage from 02/19/2024: Stage IIIB (pT3, pN1a, cM0) - Signed by Ezzard Valaria LABOR, MD on 02/21/2024 Histopathologic type: Adenocarcinoma, NOS Stage prefix: Initial diagnosis Total positive nodes: 1 Total nodes examined: 22   03/28/2024 -  Chemotherapy   Patient is on Treatment Plan : COLORECTAL Xelox (Capeox)(130/850) q21d       Interval history-  Patient presents to chemo care clinic today for initial meeting in preparation for starting chemotherapy. I introduced the chemo care clinic and we discussed that the role of the clinic is to assist those who are at an increased risk of emergency room visits and/or complications during the course of chemotherapy treatment. We discussed that the increased risk takes into account factors such as age, performance status, and co-morbidities. We also discussed that for some, this might include barriers to care such as not having a primary care provider, lack of insurance/transportation, or not being able to afford medications. We discussed that the goal of the program is to help prevent unplanned ER visits and help reduce complications during chemotherapy. We do this by discussing specific risk factors to each individual and identifying ways that we can help improve these risk factors and reduce barriers to care.   Allergies[1]  Past Medical History:  Diagnosis Date   Anemia    Breast cancer (HCC)    CAD (coronary artery disease), native coronary artery 12/22/2016  Cancer (HCC) 11/2018   left breast DCIS   Cardiomyopathy (HCC)    Carotid bruit 10/27/2017   Complication of anesthesia    states ear drum burst during hysterectomy surgery    Coronary arteriosclerosis 01/20/2022   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   Dyslipidemia 11/26/2022   Essential hypertension 12/28/2014   GERD (gastroesophageal reflux disease)    Headache    r/t sinus   Hearing loss 01/20/2022   History of cardiomyopathy 10/27/2017   History of kidney stones    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   Hyperlipemia 12/22/2016   Hyperlipidemia    Hypertension    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   11/2018-High grade-lumpectomy-radiation 02/2019-03/14/2019   Mixed hyperlipidemia 01/20/2022   Palpitations    Paroxysmal A-fib (HCC) 11/2022   Personal history of radiation therapy     Past Surgical History:  Procedure Laterality Date   ABDOMINAL HYSTERECTOMY     with reconstructive surgery   BREAST LUMPECTOMY Left    BREAST LUMPECTOMY WITH RADIOACTIVE SEED LOCALIZATION Left 01/04/2019   Procedure: LEFT BREAST LUMPECTOMY WITH RADIOACTIVE SEED LOCALIZATION;  Surgeon: Vanderbilt Ned, MD;  Location: [facility];  Service: General;  Laterality: Left;   COLONOSCOPY     INNER EAR SURGERY Left    RE-EXCISION OF BREAST LUMPECTOMY Left 01/18/2019   Procedure: RE-EXCISION OF LEFT BREAST LUMPECTOMY;  Surgeon: Vanderbilt Ned, MD;  Location:  SURGERY CENTER;  Service: General;  Laterality: Left;   ROTATOR CUFF REPAIR Right    WRIST SURGERY Right    cyst removal    Social History   Socioeconomic History   Marital status: Married    Spouse name: Carlin Null   Number of children: 0   Years of education: Not on file   Highest education level: Some college, no degree  Occupational History   Occupation: retired  Tobacco Use   Smoking status: Never   Smokeless tobacco: Never  Vaping Use   Vaping status: Never Used  Substance and Sexual Activity   Alcohol use: Yes    Comment: rare   Drug use: Never   Sexual activity: Not Currently    Birth control/protection: Surgical   Other Topics Concern   Not on file  Social History Narrative   Not on file   Social Drivers of Health   Tobacco Use: Low Risk (02/15/2024)   Patient History    Smoking Tobacco Use: Never    Smokeless Tobacco Use: Never    Passive Exposure: Not on file  Financial Resource Strain: Low Risk (02/19/2023)   Overall Financial Resource Strain (CARDIA)    Difficulty of Paying Living Expenses: Not hard at all  Food Insecurity: No Food Insecurity (02/08/2024)   Epic    Worried About Radiation Protection Practitioner of Food in the Last Year: Never true    [employer] of Food in the Last Year: Never true  Transportation Needs: No Transportation Needs (02/08/2024)   Epic    Lack of Transportation (Medical): No    Lack of Transportation (Non-Medical): No  Physical Activity: Inactive (02/19/2023)   Exercise Vital Sign    Days of Exercise per Week: 0 days    Minutes of Exercise per Session: 0 min  Stress: No Stress Concern Present (11/26/2022)   Received from [facility] of Occupational Health - Occupational Stress Questionnaire    Feeling of Stress : Not at all  Social Connections:  Moderately Isolated (02/03/2024)   Social Connection and Isolation Panel    Frequency of Communication with Friends and Family: Three times a week    Frequency of Social Gatherings with Friends and Family: Three times a week    Attends Religious Services: Never    Active Member of Clubs or Organizations: No    Attends Banker Meetings: Never    Marital Status: Married  Catering Manager Violence: Not At Risk (02/08/2024)   Epic    Fear of Current or Ex-Partner: No    Emotionally Abused: No    Physically Abused: No    Sexually Abused: No  Depression (PHQ2-9): Low Risk (03/15/2024)   Depression (PHQ2-9)    PHQ-2 Score: 0  Alcohol Screen: Low Risk (03/06/2023)   Alcohol Screen    Last Alcohol Screening Score (AUDIT): 1  Housing: Unknown (02/08/2024)   Epic    Unable to Pay for Housing in the Last Year:  No    Number of Times Moved in the Last Year: Not on file    Homeless in the Last Year: No  Utilities: Not At Risk (02/08/2024)   Epic    Threatened with loss of utilities: No  Health Literacy: Adequate Health Literacy (02/19/2023)   B1300 Health Literacy    Frequency of need for help with medical instructions: Never    Family History  Problem Relation Age of Onset   Stroke Mother    Hypertension Mother    Kidney failure Father        Neuremia kidney poisoning   Valvular heart disease Brother    Emphysema Brother    Hearing loss Brother        hearing aids   Tuberculosis Maternal Grandmother    Alcohol abuse Maternal Grandfather        phlebitis   Throat cancer Paternal Grandmother     Current Medications[2]     Latest Ref Rng & Units 03/15/2024   10:30 AM  CMP  Glucose 70 - 99 mg/dL 97   BUN 8 - 23 mg/dL 19   Creatinine 9.55 - 1.00 mg/dL 9.16   Sodium 864 - 854 mmol/L 141   Potassium 3.5 - 5.1 mmol/L 3.6   Chloride 98 - 111 mmol/L 105   CO2 22 - 32 mmol/L 26   Calcium  8.9 - 10.3 mg/dL 89.8   Total Protein 6.5 - 8.1 g/dL 7.0   Total Bilirubin 0.0 - 1.2 mg/dL 0.2   Alkaline Phos 38 - 126 U/L 81   AST 15 - 41 U/L 23   ALT 0 - 44 U/L 6       Latest Ref Rng & Units 03/15/2024   10:30 AM  CBC  WBC 4.0 - 10.5 K/uL 4.7   Hemoglobin 12.0 - 15.0 g/dL 88.7   Hematocrit 63.9 - 46.0 % 34.9   Platelets 150 - 400 K/uL 242     No images are attached to the encounter.  No results found.   Assessment and plan- Patient is a 79 y.o. female who presents to Chemo Care Clinic for initial meeting in preparation for starting chemotherapy for the treatment of adenocarcinoma, colon.   Chemo Care Clinic/High Risk for ER/Hospitalization during chemotherapy- We discussed the role of the chemo care clinic and identified patient specific risk factors. I discussed that patient was identified as high risk primarily based on:  Patient has past medical history positive for: Past Medical  History:  Diagnosis Date   Anemia    Breast  cancer [facility])    CAD (coronary artery disease), native coronary artery 12/22/2016   Cancer (HCC) 11/2018   left breast DCIS   Cardiomyopathy (HCC)    Carotid bruit 10/27/2017   Complication of anesthesia    states ear drum burst during hysterectomy surgery   Coronary arteriosclerosis 01/20/2022   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   Dyslipidemia 11/26/2022   Essential hypertension 12/28/2014   GERD (gastroesophageal reflux disease)    Headache    r/t sinus   Hearing loss 01/20/2022   History of cardiomyopathy 10/27/2017   History of kidney stones    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   Hyperlipemia 12/22/2016   Hyperlipidemia    Hypertension    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   11/2018-High grade-lumpectomy-radiation 02/2019-03/14/2019   Mixed hyperlipidemia 01/20/2022   Palpitations    Paroxysmal A-fib (HCC) 11/2022   Personal history of radiation therapy     Patient has past surgical history positive for: Past Surgical History:  Procedure Laterality Date   ABDOMINAL HYSTERECTOMY     with reconstructive surgery   BREAST LUMPECTOMY Left    BREAST LUMPECTOMY WITH RADIOACTIVE SEED LOCALIZATION Left 01/04/2019   Procedure: LEFT BREAST LUMPECTOMY WITH RADIOACTIVE SEED LOCALIZATION;  Surgeon: Vanderbilt Ned, MD;  Location: [facility];  Service: General;  Laterality: Left;   COLONOSCOPY     INNER EAR SURGERY Left    RE-EXCISION OF BREAST LUMPECTOMY Left 01/18/2019   Procedure: RE-EXCISION OF LEFT BREAST LUMPECTOMY;  Surgeon: Vanderbilt Ned, MD;  Location: [facility];  Service: General;  Laterality: Left;   ROTATOR CUFF REPAIR Right    WRIST SURGERY Right    cyst removal     We discussed that social determinants of health may have significant impacts on health and outcomes for cancer patients.  Today we discussed specific social determinants of  performance status, alcohol use, depression, financial needs, food insecurity, housing, interpersonal violence, social connections, stress, tobacco use, and transportation.    After lengthy discussion the following were identified as areas of need:   Outpatient services: We discussed options including home based and outpatient services, DME and care program. We discusssed that patients who participate in regular physical activity report fewer negative impacts of cancer and treatments and report less fatigue.   Financial Concerns: We discussed that living with cancer can create tremendous financial burden.  We discussed options for assistance. I asked that if assistance is needed in affording medications or paying bills to please let us  know so that we can provide assistance. We discussed options for food including social services,  and onsite food pantry.  We will also notify Lizbeth Sprague to see if cancer center can provide additional support.  Referral to Social work: Introduced child psychotherapist [employer] and the services he can provide such as support with utility bill, cell phone and gas vouchers.   Support groups: We discussed options for support groups at the cancer center. If interested, please notify nurse navigator to enroll. We discussed options for managing stress including healthy eating, exercise as well as participating in no charge counseling services at the cancer center and support groups.  If these are of interest, patient can notify either myself or primary nursing team.We discussed options for management including medications and referral to quit Smart program  Transportation: We discussed options for transportation including [employer], paratransit, bus routes, link transit, taxi/uber/lyft, and cancer center van.  I have notified primary oncology team who will help assist with arranging fleeta transportation for appointments when/if needed. We also discussed options for transportation on  short notice/acute visits.  Palliative care services: We have palliative care services available in the cancer center to discuss goals of care and advanced care planning.  Please let us  know if you have any questions or would like to speak to our palliative nurse practitioner.  Symptom Management Clinic: We discussed our symptom management clinic which is available for acute concerns while receiving treatment such as nausea, vomiting or diarrhea.  We can be reached via telephone at [phone] or through my chart.  We are available for virtual or in person visits on the same day from 830 to 4 PM Monday through Friday. She denies needing specific assistance at this time and She will be followed by Dr. Trudi clinical team.  Plan: Discussed symptom management clinic. Discussed palliative care services. Discussed resources that are available here at the cancer center. Discussed medications and new prescriptions to begin treatment such as anti-nausea or steroids.  The patient is a with newly diagnosed.  Patient presents to clinic today for chemotherapy education and palliative care consult.  We will start.  We will send in prescriptions for prochlorperazine and ondansetron .  The patient verbalizes understanding of and agreement to the plan as discussed today.  Provided general information including the following: 1.  Date of education: 03/15/2024 2.  Physician name: Valaria Kerns, MD 3.  Diagnosis: Adenocarcinoma, colon 4.  Stage: Stage IIIB 5.  Palliative 6.  Chemotherapy plan including drugs and how often: Capecitabine , Oxaliplatin 7.  Start date: Pending port placement 8.  Other referrals: Social Services 9.  The patient is to call our office with any questions or concerns.  Our office number [phone], if after hours or on the weekend, call the same number and wait for the answering service.  There is always an oncologist on call 10.  Medications prescribed: Prochlorperazine,  Ondasetron 11.  The patient has verbalized understanding of the treatment plan and has no barriers to adherence or understanding.  Obtained signed consent from patient.  Discussed symptoms including 1.  Low blood counts including red blood cells, white blood cells and platelets. 2. Infection including to avoid large crowds, wash hands frequently, and stay away from people who were sick.  If fever develops of 100.4 or higher, call our office. 3.  Mucositis-given instructions on mouth rinse (baking soda and salt mixture).  Keep mouth clean.  Use soft bristle toothbrush.  If mouth sores develop, call our clinic. 4.  Nausea/vomiting-gave prescriptions for ondansetron  4 mg every 4 hours as needed for nausea, may take around the clock if persistent.  Compazine 10 mg every 6 hours, may take around the clock if persistent. 5.  Diarrhea-use over-the-counter Imodium.  Call clinic if not controlled. 6.  Constipation-use senna, 1 to 2 tablets twice a day.  If no BM in 2 to 3 days call the clinic. 7.  Loss of appetite-try to eat small meals every 2-3 hours.  Call clinic if not eating. 8.  Taste changes-zinc  500 mg daily.  If becomes severe call clinic. 9.  Alcoholic beverages. 10.  Drink 2 to 3 quarts of water per day. 11.  Peripheral neuropathy-patient to call if numbness or tingling in hands or feet is persistent  Neulasta-will be given 24 to 48 hours after chemotherapy.  Gave information sheet on bone and joint pain.  Use Claritin  or Pepcid.  May use  ibuprofen or Aleve.  Call if symptoms persist or are unbearable.  Gave information on the supportive care team and how to contact them regarding services.  Discussed advanced directives.  The patient does not have their advanced directives but will look at the copy provided in their notebook and will call with any questions. [employer] Social worker Advanced directives  Answered questions to patient satisfaction.  Patient is to call  with any further questions or concerns.  Time spent on this palliative care/chemotherapy education was 60 minutes with more than 50% spent discussing diagnosis, prognosis and symptom management.  The medication prescribed to the patient will be printed out from [email or web address] This will give the following information: Name of your medication Approved uses Dose and schedule Storage and handling Handling body fluids and waste Drug and food interactions Possible side effects and management Pregnancy, sexual activity, and contraception Obtaining medication   Disposition: RTC on   Visit Diagnosis No diagnosis found.  Patient expressed understanding and was in agreement with this plan. She also understands that She can call clinic at any time with any questions, concerns, or complaints.   I provided 60 minutes of face to face during this encounter, and > 50% was spent counseling as documented under my assessment & plan.   Eleanor Bach, FNP-BC [facility] [place] [phone]    [1]  Allergies Allergen Reactions   Cortisone Rash    States allegic to oral Cortisone, but not to shot   Ferrous Sulfate Other (See Comments)    Abdominal pains  [2]  Current Outpatient Medications:    acetaminophen  (TYLENOL ) 500 MG tablet, Take 2 tablets (1,000 mg total) by mouth every 8 (eight) hours as needed for mild pain (pain score 1-3)., Disp: , Rfl:    amLODipine  (NORVASC ) 5 MG tablet, Take 5 mg by mouth daily., Disp: , Rfl:    Ascorbic Acid (VITAMIN C) 1000 MG tablet, Take 1,000 mg by mouth daily. , Disp: , Rfl:    Celery Seed OIL, by Does not apply route., Disp: , Rfl:    cetirizine (ZYRTEC) 10 MG tablet, Take 10 mg by mouth 2 (two) times daily as needed for allergies., Disp: , Rfl:    CRANBERRY PO, Take 8,400 mg by mouth daily. With vitamin C, Disp: , Rfl:    Cyanocobalamin  (VITAMIN B-12 PO), Take 2,500 mcg by mouth daily. Dissolvable tablet, Disp: , Rfl:    fluticasone  (FLONASE) 50 MCG/ACT nasal spray, Place 1 spray into both nostrils daily as needed for allergies or rhinitis., Disp: , Rfl:    Garlic (GARLIQUE PO), Take 1 tablet by mouth daily. For cholesterol, Disp: , Rfl:    metoprolol  succinate (TOPROL -XL) 50 MG 24 hr tablet, Take 1 tablet (50 mg total) by mouth in the morning and at bedtime. Take with or immediately following a meal., Disp: 180 tablet, Rfl: 3   Multiple Vitamins-Minerals (BONEUP PO), Take 2 capsules by mouth 2 (two) times daily., Disp: , Rfl:    Multiple Vitamins-Minerals (OCUVITE PO), Take 1 tablet by mouth daily. , Disp: , Rfl:    Omega-3 1000 MG CAPS, Take 2,000 mg by mouth 2 (two) times daily., Disp: , Rfl:    omeprazole (PRILOSEC) 20 MG capsule, Take 20 mg by mouth daily., Disp: , Rfl:    ondansetron  (ZOFRAN ) 4 MG tablet, TAKE 1 TABLET BY MOUTH AT 3PM AND THEN 1 TABLET AT 10PM, Disp: , Rfl:    Probiotic CAPS, Take 1 capsule by mouth  daily. , Disp: , Rfl:    zinc  gluconate 50 MG tablet, Take 50 mg by mouth daily. , Disp: , Rfl:    capecitabine  (XELODA ) 150 MG tablet, Take 1 tablet (150 mg total) by mouth 2 (two) times daily after a meal. Take for 2 weeks on and 1 week off., Disp: 56 tablet, Rfl: 3   capecitabine  (XELODA ) 500 MG tablet, Take 2 tablets (1,000 mg total) by mouth 2 (two) times daily after a meal. Take for 2 weeks, followed by a 1-week break., Disp: 56 tablet, Rfl: 3   metroNIDAZOLE  (FLAGYL ) 500 MG tablet, Take 1,000 mg by mouth 3 (three) times daily., Disp: , Rfl:    Na Sulfate-K Sulfate-Mg Sulfate concentrate (SUPREP) 17.5-3.13-1.6 GM/177ML SOLN, TAKE BY MOUTH ONCE FOR 1 DOSE, Disp: , Rfl:    neomycin  (MYCIFRADIN ) 500 MG tablet, Take 1,000 mg by mouth 3 (three) times daily., Disp: , Rfl:   "

## 2024-03-17 NOTE — Telephone Encounter (Signed)
 Contacted pt to schedule an appt per IS WQ. Unable to reach via phone, voicemail was left.

## 2024-03-22 ENCOUNTER — Encounter: Payer: Self-pay | Admitting: Oncology

## 2024-03-22 ENCOUNTER — Other Ambulatory Visit: Payer: Self-pay | Admitting: Oncology

## 2024-03-23 ENCOUNTER — Other Ambulatory Visit: Payer: Self-pay

## 2024-03-23 ENCOUNTER — Telehealth: Payer: Self-pay

## 2024-03-23 ENCOUNTER — Other Ambulatory Visit (HOSPITAL_COMMUNITY): Payer: Self-pay

## 2024-03-23 ENCOUNTER — Inpatient Hospital Stay

## 2024-03-23 ENCOUNTER — Encounter: Payer: Self-pay | Admitting: Oncology

## 2024-03-23 ENCOUNTER — Telehealth: Payer: Self-pay | Admitting: Oncology

## 2024-03-23 DIAGNOSIS — C182 Malignant neoplasm of ascending colon: Secondary | ICD-10-CM

## 2024-03-23 MED ORDER — CAPECITABINE 500 MG PO TABS: ORAL_TABLET | ORAL | 3 refills | Status: DC

## 2024-03-23 MED ORDER — CAPECITABINE 500 MG PO TABS
ORAL_TABLET | ORAL | 3 refills | Status: AC
  Filled 2024-03-23: qty 70, 21d supply, fill #0
  Filled 2024-04-08: qty 70, 21d supply, fill #1

## 2024-03-23 NOTE — Progress Notes (Signed)
 Specialty Pharmacy Initial Fill Coordination Note  Andrea [name] is a 79 y.o. female contacted today regarding refills of specialty medication(s) Capecitabine  (XELODA ) .  Patient requested Delivery  on 03/24/24 to verified address [address]   [place] [address]   Medication will be filled on 03/23/24.   Patient is aware of $24.50 copayment (cash price at $0.35 per tab) She would like to have it charged to her AR account.  Lucie Lamer, CPhT [place]  Ms [facility] Oncology Pharmacy Patient Advocate Specialist II [place] Phone: [phone]  Fax: [phone] [email or web address]

## 2024-03-23 NOTE — Progress Notes (Signed)
 Patient counseled on xeloda  in clinic encounter on 03/23/24.   Ireoluwa Gorsline, PharmD Hematology/Oncology Clinical Pharmacist [facility] [phone]

## 2024-03-23 NOTE — Telephone Encounter (Signed)
 03/23/24 LVM with patient about rescheduled  chemo appts.

## 2024-03-23 NOTE — Telephone Encounter (Signed)
 Called to confirm pt had right physician name and address for her surgical consult (for port placement) on 03/28/2024. Pt appreciative of directions.

## 2024-03-24 ENCOUNTER — Other Ambulatory Visit: Payer: Self-pay

## 2024-03-25 ENCOUNTER — Telehealth: Payer: Self-pay

## 2024-03-25 ENCOUNTER — Encounter: Payer: Self-pay | Admitting: Oncology

## 2024-03-25 NOTE — Telephone Encounter (Signed)
 I told pt the appt's for 04/01/2024 were for her first IV chemo session, if her port has been placed by that date. Schedulers basically holding her spot to start. She is scheduled to see Dr Bert on 03/28/2024- but will have to R/S due to weather conditions. Once pt sees Dr Bert, she will call me and let me know when port placement is scheduled. Pt also told NOT to start Xeloda  until she starts her IV chemo. She verbalized understanding. Kaitlyn Schomburg,RPH, Devere Afton LIED, Dr Ezzard, & Bartley Jeni PEAK notified of all above.

## 2024-03-28 ENCOUNTER — Inpatient Hospital Stay

## 2024-03-29 ENCOUNTER — Telehealth: Payer: Self-pay

## 2024-03-29 NOTE — Progress Notes (Signed)
 Oral Chemotherapy Pharmacist Encounter  I spoke with patient for overview of: Xeloda  (capecitabine ) for the adjuvant treatment of colon cancer in conjunction with infusional oxaliplatin, planned duration of 4 cycles/3 months of therapy.   Treatment goal: Curative  Counseled patient on administration, dosing, side effects, monitoring, drug-food interactions, safe handling, storage, and disposal.  Patient will take Xeloda  500mg  tablets, 3 tablets (1500mg ) by mouth in AM and 2 tabs (1000mg ) by mouth in PM, within 30 minutes of finishing meals, on days 1-14 of each 21 day cycle.   Oxaliplatin will be infused on day 1 of each 21 day cycle.  Xeloda  and oxaliplatin start date: 04/01/2024  Adverse effects include but are not limited to: fatigue, decreased blood counts, GI upset, diarrhea, mouth sores, and hand-foot syndrome. Patient has anti-emetic on hand and knows to take it if nausea develops. Patient will obtain anti diarrheal and alert the office of 4 or more loose stools above baseline.  Reviewed with patient importance of keeping a medication schedule and plan for any missed doses. No barriers to medication adherence identified.  Medication reconciliation performed and medication/allergy list updated.  Distress thermometer not completed during in person visit as patient is also on IV therapy.   Communication and Learning Assessment Primary learner: Patient Barriers to learning: No barriers Preferred language: English Learning preferences: Listening Reading  All questions answered. Patient voiced understanding and appreciation.   Medication education handout given to patient in clinic. Patient knows to call the office with questions or concerns. Oral Chemotherapy Clinic phone number provided to patient.   Andrea [name], PharmD Hematology/Oncology Clinical Pharmacist Andrea [name] Oral Chemotherapy Navigation Clinic [phone]

## 2024-03-29 NOTE — Telephone Encounter (Signed)
 Pt had to cx surgeon appt for 03/28/24 due to weather/driving conditions. Dr Trecia office R/S appt. for 04/04/2024 w/Lininger  I notified Dr Ezzard, [facility] Bartley KATHEE PEAK, and Tammy B,CMA.

## 2024-03-29 NOTE — Patient Instructions (Signed)
 [facility]  - A DEPT OF [facility]  Thank you for choosing [facility] to provide your oncology/hematology care and for allowing us  to participate in your care today!  As a reminder, we spoke about the following today: Xeloda  (capecitabine ) for the adjuvant treatment of colon cancer in conjunction with infusional oxaliplatin, planned duration of 4 cycles/3 months of therapy.   Treatment goal: Curative  Medication handout has been provided.   **For oral cancer medication prescription refill requests, contact your pharmacy and they will contact our office if needed. Allow 5-7 days for refills to be completed by your specialty pharmacy.    Cancer Center General Instructions:  If you have an appointment at the [facility], please go directly to the Cancer Center and check in at the registration area.  We strive to give you quality time with your provider. You may need to reschedule your appointment if you arrive late (15 or more minutes).  Arriving late affects you and other patients whose appointments are after yours.  Also, if you miss three or more appointments without notifying the office, you may be dismissed from the clinic at the provider's discretion.      BELOW ARE SYMPTOMS THAT SHOULD BE REPORTED IMMEDIATELY: *FEVER GREATER THAN 100.4 F (38 C) OR HIGHER *CHILLS OR SWEATING *NAUSEA AND VOMITING THAT IS NOT CONTROLLED WITH YOUR NAUSEA MEDICATION *UNUSUAL SHORTNESS OF BREATH *UNUSUAL BRUISING OR BLEEDING *URINARY PROBLEMS (pain or burning when urinating, or frequent urination) *BOWEL PROBLEMS (unusual diarrhea, constipation, pain near the anus) TENDERNESS IN MOUTH AND THROAT WITH OR WITHOUT PRESENCE OF ULCERS (sore throat, sores in mouth, or a toothache) UNUSUAL RASH, SWELLING OR PAIN  UNUSUAL VAGINAL DISCHARGE OR ITCHING   Items with * indicate a potential emergency and should be followed up as soon as possible or go to the Emergency  Department if any problems should occur.  Please show the CHEMOTHERAPY ALERT CARD at check-in to the Emergency Department and triage nurse.  Should you have questions after your visit or need to cancel or reschedule your appointment, please contact [facility]  - A DEPT OF [facility]  [phone] and follow the prompts.  Office hours are 8:00 a.m. to 4:30 p.m. Monday - Friday. Please note that voicemails left after 4:00 p.m. may not be returned until the following business day.  We are closed weekends and major holidays. You have access to a nurse at all times for urgent questions. Please call the main number to the clinic [phone] and follow the prompts.  For any non-urgent questions, you may also contact your provider using MyChart. We now offer e-Visits for anyone 78 and older to request care online for non-urgent symptoms. For details visit [email or web address].   Also download the MyChart app! Go to the app store, search MyChart, open the app, select [facility], and log in with your MyChart username and password.

## 2024-04-01 ENCOUNTER — Inpatient Hospital Stay

## 2024-04-07 ENCOUNTER — Telehealth: Payer: Self-pay

## 2024-04-07 ENCOUNTER — Other Ambulatory Visit (HOSPITAL_COMMUNITY): Payer: Self-pay

## 2024-04-07 NOTE — Telephone Encounter (Signed)
 I called pt to see when she is scheduled to see Dr Bert. The appt has been rescheduled twice due to winter weather/snow. She sees him Monday, 04/11/2024 @ 1145.

## 2024-04-08 ENCOUNTER — Other Ambulatory Visit (HOSPITAL_COMMUNITY): Payer: Self-pay

## 2024-04-11 ENCOUNTER — Ambulatory Visit: Admitting: Family Medicine

## 2024-04-18 ENCOUNTER — Inpatient Hospital Stay

## 2024-04-18 ENCOUNTER — Inpatient Hospital Stay: Admitting: Oncology

## 2024-04-18 ENCOUNTER — Inpatient Hospital Stay: Attending: Hematology and Oncology

## 2024-04-25 ENCOUNTER — Inpatient Hospital Stay: Admitting: Oncology

## 2024-04-25 ENCOUNTER — Inpatient Hospital Stay

## 2024-05-03 ENCOUNTER — Ambulatory Visit
